# Patient Record
Sex: Female | Born: 1935
Health system: Southern US, Community
[De-identification: ages and names within clinical notes are randomized; demographics above are authoritative.]

## PROBLEM LIST (undated history)

## (undated) DIAGNOSIS — H409 Unspecified glaucoma: Secondary | ICD-10-CM

## (undated) DIAGNOSIS — E7800 Pure hypercholesterolemia, unspecified: Secondary | ICD-10-CM

## (undated) DIAGNOSIS — R413 Other amnesia: Secondary | ICD-10-CM

## (undated) DIAGNOSIS — M810 Age-related osteoporosis without current pathological fracture: Secondary | ICD-10-CM

## (undated) DIAGNOSIS — T4145XA Adverse effect of unspecified anesthetic, initial encounter: Secondary | ICD-10-CM

## (undated) DIAGNOSIS — I10 Essential (primary) hypertension: Secondary | ICD-10-CM

## (undated) DIAGNOSIS — G8929 Other chronic pain: Secondary | ICD-10-CM

## (undated) DIAGNOSIS — IMO0001 Reserved for inherently not codable concepts without codable children: Secondary | ICD-10-CM

## (undated) DIAGNOSIS — T8859XA Other complications of anesthesia, initial encounter: Secondary | ICD-10-CM

## (undated) DIAGNOSIS — M549 Dorsalgia, unspecified: Secondary | ICD-10-CM

## (undated) DIAGNOSIS — H9190 Unspecified hearing loss, unspecified ear: Secondary | ICD-10-CM

## (undated) DIAGNOSIS — F32A Depression, unspecified: Secondary | ICD-10-CM

## (undated) DIAGNOSIS — F329 Major depressive disorder, single episode, unspecified: Secondary | ICD-10-CM

## (undated) HISTORY — PX: EYE SURGERY: SHX253

## (undated) HISTORY — PX: DILATION AND CURETTAGE OF UTERUS: SHX78

## (undated) HISTORY — PX: BACK SURGERY: SHX140

## (undated) HISTORY — PX: HAND SURGERY: SHX662

## (undated) HISTORY — PX: ABDOMINAL HYSTERECTOMY: SHX81

---

## 1998-05-10 ENCOUNTER — Ambulatory Visit (HOSPITAL_COMMUNITY): Admission: RE | Admit: 1998-05-10 | Discharge: 1998-05-10 | Payer: Self-pay | Admitting: Orthopaedic Surgery

## 1998-06-24 ENCOUNTER — Inpatient Hospital Stay (HOSPITAL_COMMUNITY): Admission: AD | Admit: 1998-06-24 | Discharge: 1998-06-25 | Payer: Self-pay | Admitting: Orthopaedic Surgery

## 1999-07-02 ENCOUNTER — Emergency Department (HOSPITAL_COMMUNITY): Admission: EM | Admit: 1999-07-02 | Discharge: 1999-07-02 | Payer: Self-pay | Admitting: Emergency Medicine

## 2001-09-18 ENCOUNTER — Ambulatory Visit (HOSPITAL_COMMUNITY): Admission: RE | Admit: 2001-09-18 | Discharge: 2001-09-18 | Payer: Self-pay | Admitting: Family Medicine

## 2001-09-18 ENCOUNTER — Encounter: Payer: Self-pay | Admitting: Family Medicine

## 2003-08-12 ENCOUNTER — Encounter: Payer: Self-pay | Admitting: Family Medicine

## 2003-08-12 ENCOUNTER — Encounter: Admission: RE | Admit: 2003-08-12 | Discharge: 2003-08-12 | Payer: Self-pay | Admitting: Family Medicine

## 2003-09-24 ENCOUNTER — Ambulatory Visit (HOSPITAL_COMMUNITY): Admission: RE | Admit: 2003-09-24 | Discharge: 2003-09-24 | Payer: Self-pay | Admitting: Family Medicine

## 2004-01-06 ENCOUNTER — Encounter: Admission: RE | Admit: 2004-01-06 | Discharge: 2004-01-06 | Payer: Self-pay | Admitting: Neurology

## 2004-05-11 ENCOUNTER — Encounter: Admission: RE | Admit: 2004-05-11 | Discharge: 2004-05-11 | Payer: Self-pay | Admitting: Family Medicine

## 2004-06-08 ENCOUNTER — Encounter: Admission: RE | Admit: 2004-06-08 | Discharge: 2004-06-08 | Payer: Self-pay | Admitting: Unknown Physician Specialty

## 2004-06-24 ENCOUNTER — Ambulatory Visit (HOSPITAL_COMMUNITY): Admission: RE | Admit: 2004-06-24 | Discharge: 2004-06-24 | Payer: Self-pay | Admitting: Gastroenterology

## 2004-06-24 ENCOUNTER — Encounter (INDEPENDENT_AMBULATORY_CARE_PROVIDER_SITE_OTHER): Payer: Self-pay | Admitting: Specialist

## 2004-09-12 ENCOUNTER — Encounter: Admission: RE | Admit: 2004-09-12 | Discharge: 2004-09-12 | Payer: Self-pay | Admitting: Family Medicine

## 2005-12-06 ENCOUNTER — Encounter: Admission: RE | Admit: 2005-12-06 | Discharge: 2005-12-06 | Payer: Self-pay | Admitting: Family Medicine

## 2005-12-12 ENCOUNTER — Ambulatory Visit: Admission: RE | Admit: 2005-12-12 | Discharge: 2005-12-12 | Payer: Self-pay | Admitting: Family Medicine

## 2006-01-17 ENCOUNTER — Encounter: Admission: RE | Admit: 2006-01-17 | Discharge: 2006-01-17 | Payer: Self-pay | Admitting: Neurosurgery

## 2006-04-18 ENCOUNTER — Inpatient Hospital Stay (HOSPITAL_COMMUNITY): Admission: RE | Admit: 2006-04-18 | Discharge: 2006-04-24 | Payer: Self-pay | Admitting: Neurosurgery

## 2006-04-23 ENCOUNTER — Ambulatory Visit: Payer: Self-pay | Admitting: Physical Medicine & Rehabilitation

## 2007-06-24 ENCOUNTER — Ambulatory Visit (HOSPITAL_COMMUNITY): Admission: RE | Admit: 2007-06-24 | Discharge: 2007-06-24 | Payer: Self-pay | Admitting: Neurosurgery

## 2007-09-16 ENCOUNTER — Ambulatory Visit (HOSPITAL_COMMUNITY): Admission: RE | Admit: 2007-09-16 | Discharge: 2007-09-16 | Payer: Self-pay | Admitting: Internal Medicine

## 2008-02-16 ENCOUNTER — Encounter: Admission: RE | Admit: 2008-02-16 | Discharge: 2008-02-16 | Payer: Self-pay | Admitting: Neurosurgery

## 2008-03-09 ENCOUNTER — Encounter: Admission: RE | Admit: 2008-03-09 | Discharge: 2008-03-09 | Payer: Self-pay | Admitting: Neurology

## 2008-09-21 ENCOUNTER — Encounter (HOSPITAL_COMMUNITY): Admission: RE | Admit: 2008-09-21 | Discharge: 2008-12-02 | Payer: Self-pay | Admitting: Internal Medicine

## 2009-02-13 ENCOUNTER — Encounter: Admission: RE | Admit: 2009-02-13 | Discharge: 2009-02-13 | Payer: Self-pay | Admitting: Neurosurgery

## 2009-08-13 ENCOUNTER — Ambulatory Visit (HOSPITAL_COMMUNITY): Admission: RE | Admit: 2009-08-13 | Discharge: 2009-08-13 | Payer: Self-pay | Admitting: Neurosurgery

## 2009-08-17 ENCOUNTER — Encounter: Admission: RE | Admit: 2009-08-17 | Discharge: 2009-08-17 | Payer: Self-pay | Admitting: Neurosurgery

## 2009-12-21 ENCOUNTER — Ambulatory Visit: Payer: Self-pay | Admitting: Diagnostic Radiology

## 2009-12-21 ENCOUNTER — Emergency Department (HOSPITAL_BASED_OUTPATIENT_CLINIC_OR_DEPARTMENT_OTHER): Admission: EM | Admit: 2009-12-21 | Discharge: 2009-12-22 | Payer: Self-pay | Admitting: Emergency Medicine

## 2009-12-22 ENCOUNTER — Ambulatory Visit: Payer: Self-pay | Admitting: Diagnostic Radiology

## 2010-04-20 ENCOUNTER — Encounter: Admission: RE | Admit: 2010-04-20 | Discharge: 2010-04-20 | Payer: Self-pay | Admitting: Internal Medicine

## 2010-05-06 ENCOUNTER — Encounter (HOSPITAL_COMMUNITY): Admission: RE | Admit: 2010-05-06 | Discharge: 2010-05-06 | Payer: Self-pay | Admitting: Internal Medicine

## 2011-04-01 LAB — URINALYSIS, ROUTINE W REFLEX MICROSCOPIC
Bilirubin Urine: NEGATIVE
Glucose, UA: NEGATIVE mg/dL
Hgb urine dipstick: NEGATIVE
Ketones, ur: NEGATIVE mg/dL
Nitrite: NEGATIVE
Protein, ur: NEGATIVE mg/dL
Specific Gravity, Urine: 1.016 (ref 1.005–1.030)
Urobilinogen, UA: 0.2 mg/dL (ref 0.0–1.0)
pH: 6 (ref 5.0–8.0)

## 2011-04-01 LAB — COMPREHENSIVE METABOLIC PANEL
ALT: 11 U/L (ref 0–35)
AST: 24 U/L (ref 0–37)
Albumin: 3.8 g/dL (ref 3.5–5.2)
Alkaline Phosphatase: 79 U/L (ref 39–117)
BUN: 8 mg/dL (ref 6–23)
CO2: 28 mEq/L (ref 19–32)
Calcium: 9.3 mg/dL (ref 8.4–10.5)
Chloride: 100 mEq/L (ref 96–112)
Creatinine, Ser: 0.76 mg/dL (ref 0.4–1.2)
GFR calc Af Amer: >60 mL/min (ref >60)
GFR calc non Af Amer: >60 mL/min (ref >60)
Glucose, Bld: 88 mg/dL (ref 70–99)
Potassium: 4 mEq/L (ref 3.5–5.1)
Sodium: 137 mEq/L (ref 135–145)
Total Bilirubin: 0.5 mg/dL (ref 0.3–1.2)
Total Protein: 6.7 g/dL (ref 6.0–8.3)

## 2011-04-01 LAB — CBC
HCT: 38.9 % (ref 36.0–46.0)
Hemoglobin: 13.2 g/dL (ref 12.0–15.0)
MCHC: 33.8 g/dL (ref 30.0–36.0)
MCV: 88.3 fL (ref 78.0–100.0)
Platelets: 241 10*3/uL (ref 150–400)
RBC: 4.41 MIL/uL (ref 3.87–5.11)
RDW: 14 % (ref 11.5–15.5)
WBC: 10.8 10*3/uL — ABNORMAL HIGH (ref 4.0–10.5)

## 2011-04-01 LAB — URINE MICROSCOPIC-ADD ON

## 2011-04-01 LAB — DIFFERENTIAL
Basophils Absolute: 0 10*3/uL (ref 0.0–0.1)
Basophils Relative: 0 % (ref 0–1)
Eosinophils Absolute: 0.4 10*3/uL (ref 0.0–0.7)
Eosinophils Relative: 3 % (ref 0–5)
Lymphocytes Relative: 26 % (ref 12–46)
Lymphs Abs: 2.9 10*3/uL (ref 0.7–4.0)
Monocytes Absolute: 1 10*3/uL (ref 0.1–1.0)
Monocytes Relative: 9 % (ref 3–12)
Neutro Abs: 6.9 10*3/uL (ref 1.7–7.7)
Neutrophils Relative %: 61 % (ref 43–77)

## 2011-04-01 LAB — APTT: aPTT: 32 seconds (ref 24–37)

## 2011-04-01 LAB — PROTIME-INR
INR: 1 (ref 0.00–1.49)
Prothrombin Time: 13.3 seconds (ref 11.6–15.2)

## 2011-04-14 ENCOUNTER — Other Ambulatory Visit: Payer: Self-pay | Admitting: Internal Medicine

## 2011-04-14 DIAGNOSIS — Z1231 Encounter for screening mammogram for malignant neoplasm of breast: Secondary | ICD-10-CM

## 2011-04-25 ENCOUNTER — Ambulatory Visit
Admission: RE | Admit: 2011-04-25 | Discharge: 2011-04-25 | Disposition: A | Discharge status: Home / Self Care | Payer: Medicare Other | Source: Ambulatory Visit | Attending: Internal Medicine | Admitting: Internal Medicine

## 2011-04-25 DIAGNOSIS — Z1231 Encounter for screening mammogram for malignant neoplasm of breast: Secondary | ICD-10-CM

## 2011-05-09 NOTE — H&P (Signed)
Cynthia Guzman, JANUARY NO.:  [id number]   MEDICAL RECORD NO.:  [id number]          PATIENT TYPE:  INP   LOCATION:  2899                         FACILITY:  [facility]   PHYSICIAN:  Payton Doughty, M.D.      DATE OF BIRTH:  1932/04/14   DATE OF ADMISSION:  08/13/2009  DATE OF DISCHARGE:                              HISTORY & PHYSICAL   ADDENDUM:  Ms. Altemose complained of pain in the mid lower back and  palpation of the spine revealed exquisite point tenderness over about  T12 area.  Plain films were obtained that show a beginning compression  fracture at that level.  With this information, it was deemed unwise to  proceed with a decompression.  She is going to undergo an MRI and  vertebroplasty to be arranged over the next week for so.           ______________________________  Payton Doughty, M.D.     MWR/MEDQ  D:  08/13/2009  T:  08/13/2009  Job:  [id number]

## 2011-05-09 NOTE — H&P (Signed)
NAMECALIN, ELLERY NO.:  [id number]   MEDICAL RECORD NO.:  [id number]           PATIENT TYPE:  INP   LOCATION:                               FACILITY:  [facility]   PHYSICIAN:  Payton Doughty, M.D.      DATE OF BIRTH:  02/09/1932   DATE OF ADMISSION:  08/13/2009  DATE OF DISCHARGE:  08/13/2009                              HISTORY & PHYSICAL   ADMISSION DIAGNOSIS:  Spondylosis at L1-2, L2-3, and L3-4.   BODY OF TEXT:  This is a very nice 77 year old right-handed white lady  who has had a fusion at L4-5 following 2 decompressions by Dr. Ollen Bowl  and Dr. Ophelia Charter respectively.  She has had increasing claudicatory  complaints in her lower extremities after having done well for about a  year and half after fusion.  MR shows stenosis at L1-2, L2-3, and L3-4  and she is now admitted for decompression at those levels.   MEDICAL HISTORY:  Remarkable for hypertension.   She is on Norvasc, Zoloft, Lopressor, Darvocet, Aricept, and Xalatan.   SURGICAL HISTORY:  Two lumbar decompressions, a hysterectomy,  hemorrhoids, and foot surgery.   She has no allergies.   SOCIAL HISTORY:  She does not smoke or drinks.  She is a retired Building control surveyor.   FAMILY HISTORY:  Mom died at 58 years of age.  She had arthritis and a  bad back.  Dad died at 66 of alcohol-related problems.   REVIEW OF SYSTEMS:  Remarkable for glasses, glaucoma, hearing loss,  tinnitus, hypertension, heart murmur, hypercholesterolemia, leg pain,  leg weakness, back pain, and neck pain.   PHYSICAL EXAMINATION:  HEENT:  Within normal limits.  NECK:  She has good range of motion of her neck.  CHEST:  Clear.  CARDIAC:  Regular rate and rhythm with a 1/6 systolic murmur.  ABDOMEN:  Nontender.  No hepatosplenomegaly.  EXTREMITIES:  No clubbing or cyanosis.  GU:  Deferred.  Peripheral pulses are good.  NEUROLOGIC:  She is awake, alert, and oriented.  Cranial nerves are  intact.  Motor exam shows good strength when  seated.  When she is up and  about, subjectively her legs get weaker and she has difficulty getting  up and down steps.   MR shows stenosis at L1-2, L2-3, and L3-4.  She has tried epidural  steroids which did not seem to help her much.   CLINICAL IMPRESSION:  Neurogenic claudication secondary to lumbar  spondylosis.   Plan is for decompressive laminectomy at L1-2, L2-3, and L3-4.  The  risks and benefits have been discussed with her and she wished to  proceed.           ______________________________  Payton Doughty, M.D.     MWR/MEDQ  D:  08/13/2009  T:  08/14/2009  Job:  [id number]

## 2011-05-12 NOTE — Op Note (Signed)
Cynthia Guzman, ROTTER NO.:  [id number]   MEDICAL RECORD NO.:  [id number]          PATIENT TYPE:  INP   LOCATION:  3105                         FACILITY:  [facility]   PHYSICIAN:  Payton Doughty, M.D.      DATE OF BIRTH:  1932/03/26   DATE OF PROCEDURE:  04/18/2006  DATE OF DISCHARGE:                                 OPERATIVE REPORT   PREOPERATIVE DIAGNOSIS:  Spondylosis with spondylolisthesis at L4-5.   POSTOPERATIVE DIAGNOSIS:  Bilateral pars fractures of L4 with  spondylolisthesis of L4 and L5.   PROCEDURE:  L4-5 pedicle screws and posterolateral arthrodesis.   SURGEON:  Payton Doughty, M.D.   SERVICE:  Neurosurgery.   COMPLICATIONS:  None.   BODY OF TEXT:  [age]-year-old right-handed white girl with back pain,  leg pain and spondylolisthesis of L4 on 5.  She has had 2 prior  decompressions by other doctors.   DESCRIPTION OF PROCEDURE:  She was taken to the operating room and smoothly  anesthetized and intubated and placed prone on the operating table.  Following shaving, prepped and draped in the usual sterile fashion.  Skin  was infiltrated with 1% lidocaine and 1:400,000 epinephrine.  Old skin  incision was reopened.  The transverse process of L4 and L5 were identified  and then the remaining lamina and facet joints of 3-4 and 4-5 were  uncovered.  There was a very large pars fracture on the left side and a  smaller fracture on the right of the pars interarticularis, rendering the  level unstable.  Because of this obvious pathology, it was decided to simply  place pedicle screws at 4 and 5 and perform a fusion.  Pedicle screws were  placed at L4 and L5 using the standard landmarks and intraoperative x-ray  confirmed good placement of the screws.  There were attached to the rods and  capped.  The transverse processes and remaining facet joints were  decorticated and packed with BMP on the extender matrix.  Intraoperative x-  rays showed good placement of  screws and rods.  An 0 Vicryl was used to  close the fascia and subcutaneous tissue.  Subcuticular tissues were  reapproximated with 2-0 Vicryl and skin was closed with 3-0 nylon in running-  locked fashion.  Betadine and Telfa dressing were applied and the patient  returned to the recovery room in good condition.           ______________________________  Payton Doughty, M.D.     MWR/MEDQ  D:  04/18/2006  T:  04/19/2006  Job:  [id number]

## 2011-05-12 NOTE — Discharge Summary (Signed)
Cynthia Guzman, MARTINEK NO.:  [id number]   MEDICAL RECORD NO.:  [id number]          PATIENT TYPE:  INP   LOCATION:  3021                         FACILITY:  [facility]   PHYSICIAN:  Payton Doughty, M.D.      DATE OF BIRTH:  12/21/1932   DATE OF ADMISSION:  04/18/2006  DATE OF DISCHARGE:  04/24/2006                                 DISCHARGE SUMMARY   ADMISSION DIAGNOSIS:  Spondylosis L4-5.   DISCHARGE DIAGNOSIS:  Partial fracture L4-5.   COMPLICATIONS:  None.   DISCHARGE STATUS:  Lying well.   PROCEDURES:  1.  L4-5 pedicle screw fixation, posterolateral arthrodesis.   HISTORY OF PRESENT ILLNESS:  This 77 year old right handed female with  History and Physical as on the chart, has had two prior lumbar spine  operations.  Had increasing pain in her back and lower extremities.   PHYSICAL EXAMINATION:  GENERAL APPEARANCE:  Intact.  NEUROLOGICAL:  Intact with a grade 2 slip of L4-5.   HOSPITAL COURSE:  She was admitted after I obtained normal laboratory values  and underwent fusion and exploration and was found to have a pars fracture  bilateral of L4.  She had pedicle screws placed at 4 and 5 and  posterolateral arthrodesis.  Postoperatively, she has done extremely well.  Her leg and back pain have completely gone.  She took a little bit of time  to mobilize because of her age, but currently she is participating in  physical therapy, is up eating and voiding normally.  She had one bout of  nausea and vomiting that kept her in the hospital today, but today, she is  eating well.  She is being discharged home in the care of her family with  Percocet for pain, Cipro prophylaxis.  Her follow up will be in the [facility] office in a week for sutures.           ______________________________  Payton Doughty, M.D.     MWR/MEDQ  D:  04/24/2006  T:  04/25/2006  Job:  [id number] [id number]

## 2011-05-12 NOTE — H&P (Signed)
Cynthia Guzman, Cynthia Guzman NO.:  [id number]   MEDICAL RECORD NO.:  [id number]          PATIENT TYPE:  INP   LOCATION:  2899                         FACILITY:  [facility]   PHYSICIAN:  Payton Doughty, M.D.      DATE OF BIRTH:  1932-01-31   DATE OF ADMISSION:  04/18/2006  DATE OF DISCHARGE:                                HISTORY & PHYSICAL   ADMITTING DIAGNOSES:  Spondylosis L4-5.   Very nice 77 year old right-handed white female who in 1988 had a  decompression by Dr. Ollen Bowl, in 1998 had a second one by Dr. Ophelia Charter.  She  had not gotten much better since the second one.  There is pain in her back  that is worsening.  She ___________ down both lower extremities, worse on  the left than on the right after she has been up a bit.  She walks somewhat  bent forward, though she says leaning forward does not help her.  She does  get better when she sits.   MEDICAL HISTORY:  Hypertension.  She is on Norvasc 5 mg a day, Zoloft 100 mg  a day, Lopressor 5 mg a day, Darvocet on a p.r.n. basis, Aricept 10 mg a  day, Xalatan eye drops.   SURGICAL HISTORY:  1.  Two lumbar decompressions.  2.  Hysterectomy.  3.  Hemorrhoids.  4.  Foot surgery.   She has no allergies.   SOCIAL HISTORY:  She does not smoke or drink.  She is a retired Clinical cytogeneticist.   FAMILY HISTORY:  Mom had passed away at 77 years of age.  She had a history  of arthritis in the neck.  Her daddy died at 94 of alcohol-related problems.   REVIEW OF SYSTEMS:  Remarkable for glasses, glaucoma, hearing loss,  tinnitus, hypertension, heart murmur, hypercholesterolemia, leg pain, leg  weakness, back pain, neck pain.   PHYSICAL EXAMINATION:  HEENT:  Normal limits.  She has reasonable range of  motion in her neck.  CHEST:  Clear.  CARDIAC:  Regular rate and rhythm with a 1/6 systolic murmur.  ABDOMEN:  Nontender.  No hepatosplenomegaly.  EXTREMITIES:  Without clubbing or cyanosis.  GENITOURINARY:  Deferred.  Peripheral  pulses are good.  NEUROLOGIC:  She is awake, alert, and oriented.  Cranial nerves appear to be  intact save for bilateral hearing loss.  Motor examination showed 5/5  strength throughout the upper and lower extremities to confrontational  testing with the patient seated.  Reflexes are absent at the knees, absent  at the ankles.  Straight leg raise is negative.  Sensory:  Dysesthesias are  described bilaterally in an L5 distribution.   MR shows grade 1 verging on grade 2 slip at L4-5 with significant stenosis  and bilateral foraminal narrowing.   CLINICAL IMPRESSION:  Lumbar spondylosis with neurogenic claudication.   PLAN:  Lumbar laminectomy, diskectomy, posterior lumbar interbody fusion  with Ray threaded fusion cages, pedicle screws, and posterolateral  arthrodesis.  This assumes that the interbody space is approachable.  Other  than that it will just be pedicle screws and posterolateral arthrodesis.  The risks and benefits of this approach have been discussed with her and she  wished to proceed.           ______________________________  Payton Doughty, M.D.     MWR/MEDQ  D:  04/18/2006  T:  04/18/2006  Job:  [id number]

## 2011-05-12 NOTE — Op Note (Signed)
NAME:  Cynthia Guzman, BURROUGH                       ACCOUNT NO.:  [id number]   MEDICAL RECORD NO.:  [id number]                   PATIENT TYPE:  AMB   LOCATION:  ENDO                                 FACILITY:  [facility]   PHYSICIAN:  James L. Malon Kindle., M.D.          DATE OF BIRTH:  09/08/1932   DATE OF PROCEDURE:  06/24/2004  DATE OF DISCHARGE:                                 OPERATIVE REPORT   PROCEDURE:  Colonoscopy and biopsy.   MEDICATIONS:  Fentanyl 100 mcg, Versed 10 mg IV.   SCOPE:  Olympus pediatric adjustable colonoscope.   INDICATIONS:  Recent onset of diarrhea and weight loss.  Patient has lost  approximately 20 pounds, having multiple bowel movements daily.  Has a  history of mild diverticular disease.   DESCRIPTION OF PROCEDURE:  The procedure had been explained to the patient,  and consent was obtained.  In the left lateral decubitus position, the  Olympus pediatric adjustable scope was inserted in advanced.  We were able  to advance easily to the cecum.  The ileocecal valve was seen.  The terminal  ileum was entered for approximately 10 cm and was normal.  The scope was  withdrawn.  The colon carefully examined upon withdrawal.  The mucosa  throughout the entire colon was normal.  No evidence of gross colitis.  The  cecum, ascending colon, transverse, descending, and sigmoid colon were seen  well.  Scattered diverticula in the sigmoid colon.  No polyps or other  lesions seen throughout.  The mucosa was endoscopically normal with a good  vascular pattern.  There is no edema seen throughout.  Multiple random  biopsies were obtained to look for evidence of microscopic colitis.  The  rectum was free of polyps or other lesions.  The scope was withdrawn.  The  patient tolerated the procedure well.  Was maintained on low-flow oxygen and  monitored throughout.   ASSESSMENT:  Diarrhea, probably functional, 564.5, but need to rule out  microscopic colitis.   PLAN:  Will check  path.  Will see back in the office in two weeks.  In the  interim, we will go ahead and give her a prescription for Bentyl 10 mg  t.i.d.                                               Fayrene Fearing L. Malon Kindle., M.D.    Waldron Session  D:  06/24/2004  T:  06/25/2004  Job:  [id number]   cc:   Dario Guardian, M.D.  [address]  [place]  [place] [address]  Fax: [phone]

## 2012-01-25 ENCOUNTER — Other Ambulatory Visit: Payer: Self-pay | Admitting: Neurosurgery

## 2012-01-25 DIAGNOSIS — M47816 Spondylosis without myelopathy or radiculopathy, lumbar region: Secondary | ICD-10-CM

## 2012-02-06 ENCOUNTER — Inpatient Hospital Stay: Admission: RE | Admit: 2012-02-06 | Payer: Medicare Other | Source: Ambulatory Visit

## 2012-04-02 ENCOUNTER — Other Ambulatory Visit: Payer: Self-pay | Admitting: Internal Medicine

## 2012-04-02 DIAGNOSIS — Z1231 Encounter for screening mammogram for malignant neoplasm of breast: Secondary | ICD-10-CM

## 2012-04-25 ENCOUNTER — Ambulatory Visit
Admission: RE | Admit: 2012-04-25 | Discharge: 2012-04-25 | Disposition: A | Discharge status: Home / Self Care | Payer: Medicare Other | Source: Ambulatory Visit | Attending: Internal Medicine | Admitting: Internal Medicine

## 2012-04-25 DIAGNOSIS — Z1231 Encounter for screening mammogram for malignant neoplasm of breast: Secondary | ICD-10-CM

## 2012-06-17 ENCOUNTER — Other Ambulatory Visit (HOSPITAL_COMMUNITY): Payer: Self-pay | Admitting: *Deleted

## 2012-06-19 ENCOUNTER — Encounter (HOSPITAL_COMMUNITY): Payer: Self-pay

## 2012-06-19 ENCOUNTER — Encounter (HOSPITAL_COMMUNITY)
Admission: RE | Admit: 2012-06-19 | Discharge: 2012-06-19 | Disposition: A | Discharge status: Home / Self Care | Payer: Medicare Other | Source: Ambulatory Visit | Attending: Internal Medicine | Admitting: Internal Medicine

## 2012-06-19 DIAGNOSIS — M810 Age-related osteoporosis without current pathological fracture: Secondary | ICD-10-CM | POA: Insufficient documentation

## 2012-06-19 HISTORY — DX: Essential (primary) hypertension: I10

## 2012-06-19 HISTORY — DX: Dorsalgia, unspecified: M54.9

## 2012-06-19 HISTORY — DX: Major depressive disorder, single episode, unspecified: F32.9

## 2012-06-19 HISTORY — DX: Unspecified hearing loss, unspecified ear: H91.90

## 2012-06-19 HISTORY — DX: Other amnesia: R41.3

## 2012-06-19 HISTORY — DX: Other chronic pain: G89.29

## 2012-06-19 HISTORY — DX: Depression, unspecified: F32.A

## 2012-06-19 HISTORY — DX: Age-related osteoporosis without current pathological fracture: M81.0

## 2012-06-19 HISTORY — DX: Reserved for inherently not codable concepts without codable children: IMO0001

## 2012-06-19 HISTORY — DX: Pure hypercholesterolemia, unspecified: E78.00

## 2012-06-19 MED ORDER — ZOLEDRONIC ACID 5 MG/100ML IV SOLN
5.0000 mg | Freq: Once | INTRAVENOUS | Status: AC
  Administered 2012-06-19: 5 mg via INTRAVENOUS
  Filled 2012-06-19: qty 100

## 2012-06-19 MED ORDER — SODIUM CHLORIDE 0.9 % IV SOLN
Freq: Once | INTRAVENOUS | Status: AC
  Administered 2012-06-19: 250 mL via INTRAVENOUS

## 2012-06-19 NOTE — Discharge Instructions (Signed)
Drink fluids/water as tolerated over next 72hrs Tylenol or Ibuprofen OTC as directed Continue calcium and Vit D as directed by your MD 

## 2012-08-27 ENCOUNTER — Other Ambulatory Visit: Payer: Self-pay | Admitting: Orthopedic Surgery

## 2012-08-27 MED ORDER — DEXAMETHASONE SODIUM PHOSPHATE 10 MG/ML IJ SOLN: 10.0000 mg | Freq: Once | INTRAMUSCULAR | Status: DC

## 2012-08-27 MED ORDER — BUPIVACAINE 0.25 % ON-Q PUMP SINGLE CATH 300ML: 300.0000 mL | INJECTION | Status: DC

## 2012-08-27 NOTE — Progress Notes (Signed)
Preoperative surgical orders have been place into the [facility] hospital system for Cynthia Guzman on 08/27/2012, 8:04 AM  by Patrica Duel for surgery on 11/18/12.  Preop Total Knee orders including Bupivacaine On-Q pump, IV Tylenol, and IV Decadron as long as there are no contraindications to the above medications. Avel Peace, PA-C

## 2012-09-06 ENCOUNTER — Ambulatory Visit
Admission: RE | Admit: 2012-09-06 | Discharge: 2012-09-06 | Disposition: A | Discharge status: Home / Self Care | Payer: Medicare Other | Source: Ambulatory Visit | Attending: Neurosurgery | Admitting: Neurosurgery

## 2012-09-06 DIAGNOSIS — M47816 Spondylosis without myelopathy or radiculopathy, lumbar region: Secondary | ICD-10-CM

## 2012-11-11 ENCOUNTER — Encounter (HOSPITAL_COMMUNITY): Payer: Self-pay | Admitting: Pharmacist

## 2012-11-12 ENCOUNTER — Other Ambulatory Visit: Payer: Self-pay

## 2012-11-12 ENCOUNTER — Encounter (HOSPITAL_COMMUNITY)
Admission: RE | Admit: 2012-11-12 | Discharge: 2012-11-12 | Disposition: A | Discharge status: Home / Self Care | Payer: Medicare Other | Source: Ambulatory Visit | Attending: Orthopedic Surgery | Admitting: Orthopedic Surgery

## 2012-11-12 ENCOUNTER — Encounter (HOSPITAL_COMMUNITY): Payer: Self-pay

## 2012-11-12 ENCOUNTER — Ambulatory Visit (HOSPITAL_COMMUNITY)
Admission: RE | Admit: 2012-11-12 | Discharge: 2012-11-12 | Disposition: A | Discharge status: Home / Self Care | Payer: Medicare Other | Source: Ambulatory Visit | Attending: Orthopedic Surgery | Admitting: Orthopedic Surgery

## 2012-11-12 DIAGNOSIS — Z01812 Encounter for preprocedural laboratory examination: Secondary | ICD-10-CM | POA: Insufficient documentation

## 2012-11-12 DIAGNOSIS — M1710 Unilateral primary osteoarthritis, unspecified knee: Secondary | ICD-10-CM | POA: Insufficient documentation

## 2012-11-12 DIAGNOSIS — I10 Essential (primary) hypertension: Secondary | ICD-10-CM | POA: Insufficient documentation

## 2012-11-12 HISTORY — DX: Adverse effect of unspecified anesthetic, initial encounter: T41.45XA

## 2012-11-12 HISTORY — DX: Unspecified glaucoma: H40.9

## 2012-11-12 HISTORY — DX: Other complications of anesthesia, initial encounter: T88.59XA

## 2012-11-12 LAB — URINALYSIS, ROUTINE W REFLEX MICROSCOPIC
Bilirubin Urine: NEGATIVE
Glucose, UA: NEGATIVE mg/dL
Hgb urine dipstick: NEGATIVE
Ketones, ur: NEGATIVE mg/dL
Nitrite: NEGATIVE
Protein, ur: NEGATIVE mg/dL
Specific Gravity, Urine: 1.017 (ref 1.005–1.030)
Urobilinogen, UA: 1 mg/dL (ref 0.0–1.0)
pH: 6.5 (ref 5.0–8.0)

## 2012-11-12 LAB — COMPREHENSIVE METABOLIC PANEL
ALT: 11 U/L (ref 0–35)
AST: 17 U/L (ref 0–37)
Albumin: 3.6 g/dL (ref 3.5–5.2)
Alkaline Phosphatase: 73 U/L (ref 39–117)
BUN: 17 mg/dL (ref 6–23)
CO2: 28 mEq/L (ref 19–32)
Calcium: 9 mg/dL (ref 8.4–10.5)
Chloride: 101 mEq/L (ref 96–112)
Creatinine, Ser: 1.14 mg/dL — ABNORMAL HIGH (ref 0.50–1.10)
GFR calc Af Amer: 52 mL/min — ABNORMAL LOW (ref >90)
GFR calc non Af Amer: 45 mL/min — ABNORMAL LOW (ref >90)
Glucose, Bld: 91 mg/dL (ref 70–99)
Potassium: 4.5 mEq/L (ref 3.5–5.1)
Sodium: 139 mEq/L (ref 135–145)
Total Bilirubin: 0.3 mg/dL (ref 0.3–1.2)
Total Protein: 7 g/dL (ref 6.0–8.3)

## 2012-11-12 LAB — SURGICAL PCR SCREEN
MRSA, PCR: NEGATIVE
Staphylococcus aureus: NEGATIVE

## 2012-11-12 LAB — CBC
HCT: 36.5 % (ref 36.0–46.0)
Hemoglobin: 12.2 g/dL (ref 12.0–15.0)
MCH: 27.5 pg (ref 26.0–34.0)
MCHC: 33.4 g/dL (ref 30.0–36.0)
MCV: 82.4 fL (ref 78.0–100.0)
Platelets: 218 10*3/uL (ref 150–400)
RBC: 4.43 MIL/uL (ref 3.87–5.11)
RDW: 12.9 % (ref 11.5–15.5)
WBC: 8.4 10*3/uL (ref 4.0–10.5)

## 2012-11-12 LAB — PROTIME-INR
INR: 0.96 (ref 0.00–1.49)
Prothrombin Time: 12.7 seconds (ref 11.6–15.2)

## 2012-11-12 LAB — APTT: aPTT: 35 seconds (ref 24–37)

## 2012-11-12 LAB — URINE MICROSCOPIC-ADD ON

## 2012-11-12 NOTE — Patient Instructions (Addendum)
20      Your procedure is scheduled on:  Monday 11/18/2012 at 0955 am  Report to [facility] at  0730  AM.  Call this number if you have problems the morning of surgery: [phone]   Remember:   Do not eat food or drink liquids after midnight!  Take these medicines the morning of surgery with A SIP OF WATER: none   Do not bring valuables to the hospital.  .  Leave suitcase in the car. After surgery it may be brought to your room.  For patients admitted to the hospital, checkout time is 11:00 AM the day of              Discharge.    Special Instructions: See [facility] Preparing  For Surgery Instruction Sheet. Do not wear jewelry, lotions powders, perfumes. Women do not shave legs or underarms for 12 hours before showers. Contacts, partial plates,              or dentures may not be worn into surgery.                          Patients discharged the day of surgery will not be allowed to drive home.If going home the same day of surgery, must have someone stay with you first 24 hrs.at home and arrange for someone to drive you home from the              Hospital. YOUR DRIVER IS: Ronald-spouse   Please read over the following fact sheets that you were given: MRSA INFORMATION,INCENTIVE SPIROMETRY SHEET, SLEEP APNEA SHEET, BLOOD TRANSFUSION SHEET                            Telford Nab.Nanney,RN,BSN     [phone]

## 2012-11-14 LAB — URINE CULTURE: Colony Count: 10000

## 2012-11-17 ENCOUNTER — Other Ambulatory Visit: Payer: Self-pay | Admitting: Orthopedic Surgery

## 2012-11-17 MED ORDER — CEFAZOLIN SODIUM-DEXTROSE 2-3 GM-% IV SOLR
2.0000 g | INTRAVENOUS | Status: AC
  Administered 2012-11-18: 2 g via INTRAVENOUS

## 2012-11-17 NOTE — H&P (Signed)
Cynthia Guzman  DOB: 10/30/1932 Married / Language: English / Race: White Female  Date of Admission:  11/18/12  Chief Complaint:  Right Knee Pain  History of Present Illness The patient is a 78 year old female who comes in for a preoperative History and Physical. The patient is scheduled for a right total knee arthroplasty to be performed by Dr. Frank V. Aluisio, MD at [facility] on 11/18/12. The patient is a 78 year old female who presents with knee complaints. The patient reports right knee symptoms including: pain (posterior), swelling, instability, catching (popping), giving way, weakness, stiffness, soreness and grinding which began year(s) ago without any known injury. Note for "Knee pain": She has gotten to the point now that she needed to be evaluated for the pain and instability. It has been several years since I saw Ms. Haughey. Her right knee has gotten progressively worse. She use to respond to injections but for the past few injections she did not get any benefit at all. She is at a stage now where the knee is hurting her all the time. She states it is limiting what she can and can not do. It hurts her at night. She would like to be more active but can not do so because of the knee. She has had multiple cortisone injections. She is ready to proceed with surgery. They have been treated conservatively in the past for the above stated problem and despite conservative measures, they continue to have progressive pain and severe functional limitations and dysfunction. They have failed non-operative management including home exercise, medications, and injections. It is felt that they would benefit from undergoing total joint replacement. Risks and benefits of the procedure have been discussed with the patient and they elect to proceed with surgery. There are no active contraindications to surgery such as ongoing infection or rapidly progressive neurological  disease.   Problem List/Past Medical Osteoarthritis, knee (715.96)   Allergies NSAIDs. Nausea.   Family History Cerebrovascular Accident. grandmother mothers side Drug / Alcohol Addiction. father   Social History Exercise. Exercises never Illicit drug use. no Living situation. live with spouse Children. 5 or more Current work status. retired Drug/Alcohol Rehab (Previously). no Marital status. married Number of flights of stairs before winded. 1 Tobacco / smoke exposure. no Tobacco use. former smoker; smoke(d) 1 pack(s) per day Alcohol use. never consumed alcohol Post-Surgical Plans. Plan is to go home with husband.   Medication History AmLODIPine Besylate (5MG Tablet, Oral) Active. Sertraline HCl (100MG Tablet, Oral) Active. Simvastatin (20MG Tablet, Oral) Active. Metoprolol Tartrate (25MG Tablet, Oral) Active. Morphine Sulfate IR (15MG Tablet, Oral) Active. Aspirin Childrens (81MG Tablet Chewable, Oral) Active. Vitamin D3 (2000UNIT Capsule, Oral) Active. Latanoprost (0.005% Solution, Ophthalmic) Active.   Pregnancy / Birth History Pregnant. no   Past Surgical History Rotator Cuff Repair. bilateral Spinal Surgery. 3 times Cataract Surgery. bilateral Hemorrhoidectomy Hysterectomy. partial (non-cancerous)   Medical History High blood pressure Hypercholesterolemia Chronic Pain Fibromyalgia Impaired Hearing   Review of Systems General:Not Present- Chills, Fever, Night Sweats, Fatigue, Weight Gain, Weight Loss and Memory Loss. Skin:Not Present- Hives, Itching, Rash, Eczema and Lesions. HEENT:Not Present- Tinnitus, Headache, Double Vision, Visual Loss, Hearing Loss and Dentures. Respiratory:Not Present- Shortness of breath with exertion, Shortness of breath at rest, Allergies, Coughing up blood and Chronic Cough. Cardiovascular:Not Present- Chest Pain, Racing/skipping heartbeats, Difficulty Breathing Lying Down, Murmur,  Swelling and Palpitations. Gastrointestinal:Not Present- Bloody Stool, Heartburn, Abdominal Pain, Vomiting, Nausea, Constipation, Diarrhea, Difficulty Swallowing, Jaundice and Loss of   appetitie. Female Genitourinary:Not Present- Blood in Urine, Urinary frequency, Weak urinary stream, Discharge, Flank Pain, Incontinence, Painful Urination, Urgency, Urinary Retention and Urinating at Night. Musculoskeletal:Not Present- Muscle Weakness, Muscle Pain, Joint Swelling, Joint Pain, Back Pain, Morning Stiffness and Spasms. Neurological:Not Present- Tremor, Dizziness, Blackout spells, Paralysis, Difficulty with balance and Weakness. Psychiatric:Not Present- Insomnia.   Vitals Pulse: 60 (Regular) Resp.: 16 (Unlabored) BP: 118/58 (Sitting, Right Arm, Standard)    Physical Exam The physical exam findings are as follows:  Note: Patient is a 78 year old female with continued knee pain. Patient is accompanied today by her husband. Patient is hard of hearing.   General Mental Status - Alert, cooperative and good historian. General Appearance- pleasant. Not in acute distress. Orientation- Oriented X3. Build & Nutrition- Well nourished and Well developed.   Head and Neck Head- normocephalic, atraumatic . Neck Global Assessment- bruit auscultated on the right and supple. no bruit auscultated on the left. Carotid Arteries- Right- bruit.   Eye Pupil- Bilateral- Regular and Round. Note: wears glasses Motion- Bilateral- EOMI.   ENMT  upper and lower dentures  Chest and Lung Exam Auscultation: Breath sounds:- clear at anterior chest wall and - clear at posterior chest wall. Adventitious sounds:- No Adventitious sounds.   Cardiovascular Auscultation:Rhythm- Regular rate and rhythm. Heart Sounds- S1 WNL and S2 WNL. Murmurs & Other Heart Sounds:Auscultation of the heart reveals - No Murmurs.   Abdomen Palpation/Percussion:Tenderness- Abdomen is  non-tender to palpation. Rigidity (guarding)- Abdomen is soft. Auscultation:Auscultation of the abdomen reveals - Bowel sounds normal.   Female Genitourinary Not done, not pertinent to present illness  Musculoskeletal She is a well developed female. No distress. Both hips are with a normal range of motion with no discomfort. The right knee is with slight valgus. Range is 5-120. Moderate crepitus on range of motion. Tender medial and lateral with no instability noted. The left knee 0-125. Minimal crepitus on range of motion. No joint line tenderness or instability. Pulses, sensation and motor are intact in both lower extremities.  RADIOGRAPHS: AP of both knees and lateral show severe end stage arthritis of her right knee, tricompartmental changes worse laterally with a slight valgus. She has severe patellofemoral arthritis also. The left knee is unremarkable.  Assessment & Plan Osteoarthritis, knee (715.96) Impression: Right Knee  Note: Plan is for a right total knee replacement by Dr. Aluisio.  Plan is to go home.  PCP - Dr. Tisovec - Patient has been seen preoperatively and felt to be stable for surgery.  Please note, at the time of her H&P, the patient stated that she had a fall about three weeks ago at her home on the porch. She denies any syncopal or presyncopal episode associated with the fall. She states that she did hit her head and developed dizziness soon thereafter. She had the dizziness feeling for about a week before it resolved. She denied having any visual changes during that week, and also denied nausea with the episode. She brings this up at her visist. Due to having a head injury and dizziness lasting almost a week, it was recommended that she go to her medical physician for evaluation prior to undergoing anesthesia, i.e. possible spinal anesthesia for knee repalcement.  Signed electronically by DREW L PERKINS, PA-C  

## 2012-11-18 ENCOUNTER — Encounter (HOSPITAL_COMMUNITY)
Admission: RE | Disposition: A | Discharge status: Home Health | Payer: Self-pay | Source: Ambulatory Visit | Attending: Orthopedic Surgery

## 2012-11-18 ENCOUNTER — Inpatient Hospital Stay (HOSPITAL_COMMUNITY): Payer: Medicare Other | Admitting: Anesthesiology

## 2012-11-18 ENCOUNTER — Encounter (HOSPITAL_COMMUNITY): Payer: Self-pay | Admitting: *Deleted

## 2012-11-18 ENCOUNTER — Inpatient Hospital Stay (HOSPITAL_COMMUNITY)
Admission: RE | Admit: 2012-11-18 | Discharge: 2012-11-21 | DRG: 470 | Disposition: A | Discharge status: Home Health | Payer: Medicare Other | Source: Ambulatory Visit | Attending: Orthopedic Surgery | Admitting: Orthopedic Surgery

## 2012-11-18 ENCOUNTER — Encounter (HOSPITAL_COMMUNITY): Payer: Self-pay | Admitting: Anesthesiology

## 2012-11-18 DIAGNOSIS — I10 Essential (primary) hypertension: Secondary | ICD-10-CM | POA: Diagnosis present

## 2012-11-18 DIAGNOSIS — Z96659 Presence of unspecified artificial knee joint: Secondary | ICD-10-CM

## 2012-11-18 DIAGNOSIS — M1710 Unilateral primary osteoarthritis, unspecified knee: Principal | ICD-10-CM | POA: Diagnosis present

## 2012-11-18 DIAGNOSIS — M179 Osteoarthritis of knee, unspecified: Secondary | ICD-10-CM | POA: Diagnosis present

## 2012-11-18 DIAGNOSIS — D62 Acute posthemorrhagic anemia: Secondary | ICD-10-CM | POA: Diagnosis not present

## 2012-11-18 HISTORY — PX: TOTAL KNEE ARTHROPLASTY: SHX125

## 2012-11-18 LAB — TYPE AND SCREEN
ABO/RH(D): O POS
Antibody Screen: NEGATIVE

## 2012-11-18 LAB — ABO/RH: ABO/RH(D): O POS

## 2012-11-18 SURGERY — ARTHROPLASTY, KNEE, TOTAL
Anesthesia: Spinal | Site: Knee | Laterality: Right | Wound class: Clean

## 2012-11-18 MED ORDER — CHLORHEXIDINE GLUCONATE 4 % EX LIQD
60.0000 mL | Freq: Once | CUTANEOUS | Status: DC
  Filled 2012-11-18: qty 60

## 2012-11-18 MED ORDER — SERTRALINE HCL 100 MG PO TABS
100.0000 mg | ORAL_TABLET | Freq: Every day | ORAL | Status: DC
  Administered 2012-11-18 – 2012-11-20 (×3): 100 mg via ORAL
  Filled 2012-11-18 (×4): qty 1

## 2012-11-18 MED ORDER — MORPHINE SULFATE 2 MG/ML IJ SOLN
1.0000 mg | INTRAMUSCULAR | Status: DC | PRN
  Administered 2012-11-18 – 2012-11-19 (×10): 2 mg via INTRAVENOUS
  Filled 2012-11-18 (×10): qty 1

## 2012-11-18 MED ORDER — PHENOL 1.4 % MT LIQD: 1.0000 | OROMUCOSAL | Status: DC | PRN

## 2012-11-18 MED ORDER — MENTHOL 3 MG MT LOZG: 1.0000 | LOZENGE | OROMUCOSAL | Status: DC | PRN

## 2012-11-18 MED ORDER — METHOCARBAMOL 100 MG/ML IJ SOLN
500.0000 mg | Freq: Four times a day (QID) | INTRAVENOUS | Status: DC | PRN
  Administered 2012-11-18 (×2): 500 mg via INTRAVENOUS
  Filled 2012-11-18 (×2): qty 5

## 2012-11-18 MED ORDER — ONDANSETRON HCL 4 MG PO TABS: 4.0000 mg | ORAL_TABLET | Freq: Four times a day (QID) | ORAL | Status: DC | PRN

## 2012-11-18 MED ORDER — LACTATED RINGERS IV SOLN
INTRAVENOUS | Status: DC
  Administered 2012-11-18: 11:00:00 via INTRAVENOUS
  Administered 2012-11-18: 1000 mL via INTRAVENOUS

## 2012-11-18 MED ORDER — METOPROLOL TARTRATE 50 MG PO TABS
50.0000 mg | ORAL_TABLET | Freq: Every day | ORAL | Status: DC
  Administered 2012-11-18 – 2012-11-20 (×3): 50 mg via ORAL
  Filled 2012-11-18 (×4): qty 1

## 2012-11-18 MED ORDER — METOCLOPRAMIDE HCL 10 MG PO TABS: 5.0000 mg | ORAL_TABLET | Freq: Three times a day (TID) | ORAL | Status: DC | PRN

## 2012-11-18 MED ORDER — ONDANSETRON HCL 4 MG/2ML IJ SOLN: 4.0000 mg | Freq: Four times a day (QID) | INTRAMUSCULAR | Status: DC | PRN

## 2012-11-18 MED ORDER — ACETAMINOPHEN 650 MG RE SUPP: 650.0000 mg | Freq: Four times a day (QID) | RECTAL | Status: DC | PRN

## 2012-11-18 MED ORDER — SODIUM CHLORIDE 0.9 % IR SOLN
Status: DC | PRN
  Administered 2012-11-18: 1000 mL

## 2012-11-18 MED ORDER — POLYETHYLENE GLYCOL 3350 17 G PO PACK
17.0000 g | PACK | Freq: Every day | ORAL | Status: DC | PRN
  Administered 2012-11-18: 17 g via ORAL

## 2012-11-18 MED ORDER — 0.9 % SODIUM CHLORIDE (POUR BTL) OPTIME
TOPICAL | Status: DC | PRN
  Administered 2012-11-18: 1000 mL

## 2012-11-18 MED ORDER — RIVAROXABAN 10 MG PO TABS
10.0000 mg | ORAL_TABLET | Freq: Every day | ORAL | Status: DC
  Administered 2012-11-19 – 2012-11-21 (×3): 10 mg via ORAL
  Filled 2012-11-18 (×4): qty 1

## 2012-11-18 MED ORDER — ONDANSETRON HCL 4 MG/2ML IJ SOLN
INTRAMUSCULAR | Status: DC | PRN
  Administered 2012-11-18: 4 mg via INTRAVENOUS

## 2012-11-18 MED ORDER — BUPIVACAINE 0.25 % ON-Q PUMP SINGLE CATH 300ML
INJECTION | Status: DC | PRN
  Administered 2012-11-18: 300 mL

## 2012-11-18 MED ORDER — EPHEDRINE SULFATE 50 MG/ML IJ SOLN
INTRAMUSCULAR | Status: DC | PRN
  Administered 2012-11-18 (×2): 5 mg via INTRAVENOUS

## 2012-11-18 MED ORDER — BUPIVACAINE 0.25 % ON-Q PUMP SINGLE CATH 300ML
INJECTION | Status: AC
  Filled 2012-11-18: qty 300

## 2012-11-18 MED ORDER — DEXAMETHASONE 6 MG PO TABS
10.0000 mg | ORAL_TABLET | Freq: Once | ORAL | Status: AC
  Filled 2012-11-18: qty 1

## 2012-11-18 MED ORDER — METHOCARBAMOL 500 MG PO TABS
500.0000 mg | ORAL_TABLET | Freq: Four times a day (QID) | ORAL | Status: DC | PRN
  Administered 2012-11-19 – 2012-11-21 (×5): 500 mg via ORAL
  Filled 2012-11-18 (×5): qty 1

## 2012-11-18 MED ORDER — DIPHENHYDRAMINE HCL 12.5 MG/5ML PO ELIX
12.5000 mg | ORAL_SOLUTION | ORAL | Status: DC | PRN
  Administered 2012-11-18: 12.5 mg via ORAL
  Administered 2012-11-19: 25 mg via ORAL
  Filled 2012-11-18: qty 5
  Filled 2012-11-18: qty 10

## 2012-11-18 MED ORDER — PROPOFOL 10 MG/ML IV EMUL
INTRAVENOUS | Status: DC | PRN
  Administered 2012-11-18: 50 ug/kg/min via INTRAVENOUS

## 2012-11-18 MED ORDER — FLEET ENEMA 7-19 GM/118ML RE ENEM: 1.0000 | ENEMA | Freq: Once | RECTAL | Status: AC | PRN

## 2012-11-18 MED ORDER — MORPHINE SULFATE 15 MG PO TABS
15.0000 mg | ORAL_TABLET | ORAL | Status: DC | PRN
  Filled 2012-11-18: qty 1

## 2012-11-18 MED ORDER — DOCUSATE SODIUM 100 MG PO CAPS
100.0000 mg | ORAL_CAPSULE | Freq: Two times a day (BID) | ORAL | Status: DC
  Administered 2012-11-18 – 2012-11-21 (×6): 100 mg via ORAL

## 2012-11-18 MED ORDER — BISACODYL 10 MG RE SUPP: 10.0000 mg | Freq: Every day | RECTAL | Status: DC | PRN

## 2012-11-18 MED ORDER — OXYCODONE HCL 5 MG PO TABS
5.0000 mg | ORAL_TABLET | ORAL | Status: DC | PRN
  Administered 2012-11-18 – 2012-11-21 (×13): 10 mg via ORAL
  Filled 2012-11-18 (×13): qty 2

## 2012-11-18 MED ORDER — BUPIVACAINE ON-Q PAIN PUMP (FOR ORDER SET NO CHG)
INJECTION | Status: DC
  Filled 2012-11-18: qty 1

## 2012-11-18 MED ORDER — MIDAZOLAM HCL 5 MG/5ML IJ SOLN
INTRAMUSCULAR | Status: DC | PRN
  Administered 2012-11-18 (×2): 1 mg via INTRAVENOUS

## 2012-11-18 MED ORDER — TRAMADOL HCL 50 MG PO TABS: 50.0000 mg | ORAL_TABLET | Freq: Four times a day (QID) | ORAL | Status: DC | PRN

## 2012-11-18 MED ORDER — ACETAMINOPHEN 10 MG/ML IV SOLN
1000.0000 mg | Freq: Once | INTRAVENOUS | Status: AC
  Administered 2012-11-18: 1000 mg via INTRAVENOUS

## 2012-11-18 MED ORDER — MORPHINE SULFATE 15 MG PO TABS
15.0000 mg | ORAL_TABLET | ORAL | Status: DC | PRN
  Administered 2012-11-18 – 2012-11-19 (×3): 15 mg via ORAL
  Filled 2012-11-18 (×2): qty 1

## 2012-11-18 MED ORDER — ACETAMINOPHEN 325 MG PO TABS
650.0000 mg | ORAL_TABLET | Freq: Four times a day (QID) | ORAL | Status: DC | PRN
  Administered 2012-11-20 – 2012-11-21 (×2): 650 mg via ORAL
  Filled 2012-11-18 (×2): qty 2

## 2012-11-18 MED ORDER — LATANOPROST 0.005 % OP SOLN
1.0000 [drp] | Freq: Every day | OPHTHALMIC | Status: DC
  Administered 2012-11-18 – 2012-11-20 (×3): 1 [drp] via OPHTHALMIC
  Filled 2012-11-18: qty 2.5

## 2012-11-18 MED ORDER — AMLODIPINE BESYLATE 5 MG PO TABS
5.0000 mg | ORAL_TABLET | Freq: Every day | ORAL | Status: DC
  Administered 2012-11-18 – 2012-11-20 (×3): 5 mg via ORAL
  Filled 2012-11-18 (×4): qty 1

## 2012-11-18 MED ORDER — FENTANYL CITRATE 0.05 MG/ML IJ SOLN
INTRAMUSCULAR | Status: DC | PRN
  Administered 2012-11-18: 100 ug via INTRAVENOUS

## 2012-11-18 MED ORDER — SODIUM CHLORIDE 0.9 % IV SOLN: INTRAVENOUS | Status: DC

## 2012-11-18 MED ORDER — BUPIVACAINE IN DEXTROSE 0.75-8.25 % IT SOLN
INTRATHECAL | Status: DC | PRN
  Administered 2012-11-18: 1.6 mL via INTRATHECAL

## 2012-11-18 MED ORDER — ACETAMINOPHEN 10 MG/ML IV SOLN
1000.0000 mg | Freq: Four times a day (QID) | INTRAVENOUS | Status: AC
  Administered 2012-11-18 – 2012-11-19 (×4): 1000 mg via INTRAVENOUS
  Filled 2012-11-18 (×6): qty 100

## 2012-11-18 MED ORDER — METOCLOPRAMIDE HCL 5 MG/ML IJ SOLN: 5.0000 mg | Freq: Three times a day (TID) | INTRAMUSCULAR | Status: DC | PRN

## 2012-11-18 MED ORDER — PROMETHAZINE HCL 25 MG/ML IJ SOLN: 6.2500 mg | INTRAMUSCULAR | Status: DC | PRN

## 2012-11-18 MED ORDER — HYDROMORPHONE HCL PF 1 MG/ML IJ SOLN: 0.2500 mg | INTRAMUSCULAR | Status: DC | PRN

## 2012-11-18 MED ORDER — SIMVASTATIN 20 MG PO TABS
20.0000 mg | ORAL_TABLET | Freq: Every day | ORAL | Status: DC
  Administered 2012-11-18 – 2012-11-20 (×3): 20 mg via ORAL
  Filled 2012-11-18 (×4): qty 1

## 2012-11-18 MED ORDER — DEXAMETHASONE SODIUM PHOSPHATE 10 MG/ML IJ SOLN
10.0000 mg | Freq: Once | INTRAMUSCULAR | Status: AC
  Administered 2012-11-19: 10 mg via INTRAVENOUS
  Filled 2012-11-18: qty 1

## 2012-11-18 MED ORDER — CEFAZOLIN SODIUM 1-5 GM-% IV SOLN
1.0000 g | Freq: Four times a day (QID) | INTRAVENOUS | Status: AC
  Administered 2012-11-18 (×2): 1 g via INTRAVENOUS
  Filled 2012-11-18 (×4): qty 50

## 2012-11-18 SURGICAL SUPPLY — 54 items
BAG SPEC THK2 15X12 ZIP CLS (MISCELLANEOUS) ×1
BAG ZIPLOCK 12X15 (MISCELLANEOUS) ×2 IMPLANT
BANDAGE ELASTIC 6 VELCRO ST LF (GAUZE/BANDAGES/DRESSINGS) ×2 IMPLANT
BANDAGE ESMARK 6X9 LF (GAUZE/BANDAGES/DRESSINGS) ×1 IMPLANT
BLADE SAG 18X100X1.27 (BLADE) ×2 IMPLANT
BLADE SAW SGTL 11.0X1.19X90.0M (BLADE) ×2 IMPLANT
BNDG CMPR 9X6 STRL LF SNTH (GAUZE/BANDAGES/DRESSINGS) ×1
BNDG ESMARK 6X9 LF (GAUZE/BANDAGES/DRESSINGS) ×2
BOWL SMART MIX CTS (DISPOSABLE) ×2 IMPLANT
CATH KIT ON-Q SILVERSOAK 5 (CATHETERS) ×1 IMPLANT
CATH KIT ON-Q SILVERSOAK 5IN (CATHETERS) ×2 IMPLANT
CEMENT HV SMART SET (Cement) ×4 IMPLANT
CLOTH BEACON ORANGE TIMEOUT ST (SAFETY) ×2 IMPLANT
CUFF TOURN SGL QUICK 34 (TOURNIQUET CUFF) ×2
CUFF TRNQT CYL 34X4X40X1 (TOURNIQUET CUFF) ×1 IMPLANT
DRAPE EXTREMITY T 121X128X90 (DRAPE) ×2 IMPLANT
DRAPE POUCH INSTRU U-SHP 10X18 (DRAPES) ×2 IMPLANT
DRAPE U-SHAPE 47X51 STRL (DRAPES) ×2 IMPLANT
DRSG ADAPTIC 3X8 NADH LF (GAUZE/BANDAGES/DRESSINGS) ×2 IMPLANT
DRSG PAD ABDOMINAL 8X10 ST (GAUZE/BANDAGES/DRESSINGS) ×1 IMPLANT
DURAPREP 26ML APPLICATOR (WOUND CARE) ×2 IMPLANT
ELECT REM PT RETURN 9FT ADLT (ELECTROSURGICAL) ×2
ELECTRODE REM PT RTRN 9FT ADLT (ELECTROSURGICAL) ×1 IMPLANT
EVACUATOR 1/8 PVC DRAIN (DRAIN) ×2 IMPLANT
FACESHIELD LNG OPTICON STERILE (SAFETY) ×10 IMPLANT
GLOVE BIO SURGEON STRL SZ8 (GLOVE) ×2 IMPLANT
GLOVE BIOGEL PI IND STRL 8 (GLOVE) ×2 IMPLANT
GLOVE BIOGEL PI INDICATOR 8 (GLOVE) ×2
GLOVE ECLIPSE 8.0 STRL XLNG CF (GLOVE) ×2 IMPLANT
GLOVE SURG SS PI 6.5 STRL IVOR (GLOVE) ×4 IMPLANT
GOWN STRL NON-REIN LRG LVL3 (GOWN DISPOSABLE) ×4 IMPLANT
GOWN STRL REIN XL XLG (GOWN DISPOSABLE) ×2 IMPLANT
HANDPIECE INTERPULSE COAX TIP (DISPOSABLE) ×2
IMMOBILIZER KNEE 20 (SOFTGOODS) ×2
IMMOBILIZER KNEE 20 THIGH 36 (SOFTGOODS) ×1 IMPLANT
KIT BASIN OR (CUSTOM PROCEDURE TRAY) ×2 IMPLANT
MANIFOLD NEPTUNE II (INSTRUMENTS) ×2 IMPLANT
NS IRRIG 1000ML POUR BTL (IV SOLUTION) ×2 IMPLANT
PACK TOTAL JOINT (CUSTOM PROCEDURE TRAY) ×2 IMPLANT
PAD ABD 7.5X8 STRL (GAUZE/BANDAGES/DRESSINGS) ×2 IMPLANT
PADDING CAST COTTON 6X4 STRL (CAST SUPPLIES) ×5 IMPLANT
POSITIONER SURGICAL ARM (MISCELLANEOUS) ×2 IMPLANT
SET HNDPC FAN SPRY TIP SCT (DISPOSABLE) ×1 IMPLANT
SPONGE GAUZE 4X4 12PLY (GAUZE/BANDAGES/DRESSINGS) ×2 IMPLANT
STRIP CLOSURE SKIN 1/2X4 (GAUZE/BANDAGES/DRESSINGS) ×4 IMPLANT
SUCTION FRAZIER 12FR DISP (SUCTIONS) ×2 IMPLANT
SUT MNCRL AB 4-0 PS2 18 (SUTURE) ×2 IMPLANT
SUT VIC AB 2-0 CT1 27 (SUTURE) ×6
SUT VIC AB 2-0 CT1 TAPERPNT 27 (SUTURE) ×3 IMPLANT
SUT VLOC 180 0 24IN GS25 (SUTURE) ×2 IMPLANT
TOWEL OR 17X26 10 PK STRL BLUE (TOWEL DISPOSABLE) ×4 IMPLANT
TRAY FOLEY CATH 14FRSI W/METER (CATHETERS) ×2 IMPLANT
WATER STERILE IRR 1500ML POUR (IV SOLUTION) ×2 IMPLANT
WRAP KNEE MAXI GEL POST OP (GAUZE/BANDAGES/DRESSINGS) ×4 IMPLANT

## 2012-11-18 NOTE — Interval H&P Note (Signed)
History and Physical Interval Note:  11/18/2012 8:20 AM  Cynthia Guzman  has presented today for surgery, with the diagnosis of osteoarthritis right knee  The various methods of treatment have been discussed with the patient and family. After consideration of risks, benefits and other options for treatment, the patient has consented to  Procedure(s) (LRB) with comments: TOTAL KNEE ARTHROPLASTY (Right) as a surgical intervention .  The patient's history has been reviewed, patient examined, no change in status, stable for surgery.  I have reviewed the patient's chart and labs.  Questions were answered to the patient's satisfaction.     Loanne Drilling

## 2012-11-18 NOTE — Anesthesia Postprocedure Evaluation (Signed)
  Anesthesia Post-op Note  Patient: [name]  Procedure(s) Performed: Procedure(s) (LRB): TOTAL KNEE ARTHROPLASTY (Right)  Patient Location: PACU  Anesthesia Type: Spinal  Level of Consciousness: awake and alert   Airway and Oxygen Therapy: Patient Spontanous Breathing  Post-op Pain: mild  Post-op Assessment: Post-op Vital signs reviewed, Patient's Cardiovascular Status Stable, Respiratory Function Stable, Patent Airway and No signs of Nausea or vomiting  Last Vitals:  Filed Vitals:   11/18/12 1215  BP: 137/58  Pulse: 54  Temp:   Resp: 11    Post-op Vital Signs: stable   Complications: No apparent anesthesia complications

## 2012-11-18 NOTE — Anesthesia Preprocedure Evaluation (Signed)
Anesthesia Evaluation  Patient identified by MRN, date of birth, ID band Patient awake    Reviewed: Allergy & Precautions, H&P , NPO status , Patient's Chart, lab work & pertinent test results  Airway Mallampati: II TM Distance: <3 FB Neck ROM: Full    Dental No notable dental hx.    Pulmonary neg pulmonary ROS,  breath sounds clear to auscultation  Pulmonary exam normal       Cardiovascular hypertension, Pt. on medications Rhythm:Regular Rate:Normal     Neuro/Psych negative neurological ROS  negative psych ROS   GI/Hepatic negative GI ROS, Neg liver ROS,   Endo/Other  negative endocrine ROS  Renal/GU negative Renal ROS  negative genitourinary   Musculoskeletal negative musculoskeletal ROS (+)   Abdominal   Peds negative pediatric ROS (+)  Hematology negative hematology ROS (+)   Anesthesia Other Findings   Reproductive/Obstetrics negative OB ROS                           Anesthesia Physical Anesthesia Plan  ASA: II  Anesthesia Plan: Spinal   Post-op Pain Management:    Induction: Intravenous  Airway Management Planned: Simple Face Mask  Additional Equipment:   Intra-op Plan:   Post-operative Plan:   Informed Consent: I have reviewed the patients History and Physical, chart, labs and discussed the procedure including the risks, benefits and alternatives for the proposed anesthesia with the patient or authorized representative who has indicated his/her understanding and acceptance.     Plan Discussed with: CRNA and Surgeon  Anesthesia Plan Comments:         Anesthesia Quick Evaluation

## 2012-11-18 NOTE — Anesthesia Procedure Notes (Signed)
Spinal  Patient location during procedure: OR Staffing Performed by: anesthesiologist  Preanesthetic Checklist Completed: patient identified, site marked, surgical consent, pre-op evaluation, timeout performed, IV checked, risks and benefits discussed and monitors and equipment checked Spinal Block Patient position: sitting Prep: Betadine Patient monitoring: heart rate, continuous pulse ox and blood pressure Injection technique: single-shot Needle Needle type: Quincke  Needle gauge: 22 G Needle length: 9 cm Additional Notes Expiration date of kit checked and confirmed. Patient tolerated procedure well, without complications.     

## 2012-11-18 NOTE — Preoperative (Signed)
Beta Blockers   Reason not to administer Beta Blockers:Took Lopressor last pm at 2100.

## 2012-11-18 NOTE — Transfer of Care (Signed)
Immediate Anesthesia Transfer of Care Note  Patient: Cynthia Guzman  Procedure(s) Performed: Procedure(s) (LRB) with comments: TOTAL KNEE ARTHROPLASTY (Right)  Patient Location: PACU  Anesthesia Type:Spinal  Level of Consciousness: awake, alert , oriented and patient cooperative  Airway & Oxygen Therapy: Patient Spontanous Breathing and Patient connected to face mask oxygen  Post-op Assessment: Report given to PACU RN, Post -op Vital signs reviewed and stable and SAB level T11 and able to wiggle toes.  Post vital signs: Reviewed and stable  Complications: No apparent anesthesia complications

## 2012-11-18 NOTE — Progress Notes (Signed)
Pt. Stated she had 3-4 episodes of diarrhea this am. States she has a history of this and usually it ocures once a month. States this is not unusual for her. Denies any other symptoms.

## 2012-11-18 NOTE — Addendum Note (Signed)
Addendum  created 11/18/12 1232 by George Rose, MD   Modules edited:Anesthesia Blocks and Procedures, Anesthesia Medication Administration, Inpatient Notes    

## 2012-11-18 NOTE — Plan of Care (Signed)
Problem: Consults Goal: Diagnosis- Total Joint Replacement Outcome: Completed/Met Date Met:  11/18/12 Primary Total Knee     

## 2012-11-18 NOTE — Op Note (Signed)
Pre-operative diagnosis- Osteoarthritis  Right knee(s)  Post-operative diagnosis- Osteoarthritis Right knee(s)  Procedure-  Right  Total Knee Arthroplasty  Surgeon- Gus Rankin. Aluisio, MD  Assistant- Dimitri Ped, PA-C   Anesthesia-  Spinal EBL-* No blood loss amount entered *  Drains Hemovac  Tourniquet time-  Total Tourniquet Time Documented: Thigh (Right) - 32 minutes   Complications- None  Condition-PACU - hemodynamically stable.   Brief Clinical Note  Cynthia Guzman is a 78 y.o. year old female with end stage OA of her right knee with progressively worsening pain and dysfunction. She has constant pain, with activity and at rest and significant functional deficits with difficulties even with ADLs. She has had extensive non-op management including analgesics, injections of cortisone and viscosupplements, and home exercise program, but remains in significant pain with significant dysfunction.Radiographs show bone on bone arthritis tricompartmental in appearance. She presents now for right Total Knee Arthroplasty.    Procedure in detail---   The patient is brought into the operating room and positioned supine on the operating table. After successful administration of  Spinal,   a tourniquet is placed high on the  Right thigh(s) and the lower extremity is prepped and draped in the usual sterile fashion. Time out is performed by the operating team and then the  Right lower extremity is wrapped in Esmarch, knee flexed and the tourniquet inflated to 300 mmHg.       A midline incision is made with a ten blade through the subcutaneous tissue to the level of the extensor mechanism. A fresh blade is used to make a medial parapatellar arthrotomy. Soft tissue over the proximal medial tibia is subperiosteally elevated to the joint line with a knife and into the semimembranosus bursa with a Cobb elevator. Soft tissue over the proximal lateral tibia is elevated with attention being paid to  avoiding the patellar tendon on the tibial tubercle. The patella is everted, knee flexed 90 degrees and the ACL and PCL are removed. Findings are bone on bone all 3 compartments with large global osteophyte formation.        The drill is used to create a starting hole in the distal femur and the canal is thoroughly irrigated with sterile saline to remove the fatty contents. The 5 degree Right  valgus alignment guide is placed into the femoral canal and the distal femoral cutting block is pinned to remove 10 mm off the distal femur. Resection is made with an oscillating saw.      The tibia is subluxed forward and the menisci are removed. The extramedullary alignment guide is placed referencing proximally at the medial aspect of the tibial tubercle and distally along the second metatarsal axis and tibial crest. The block is pinned to remove 2mm off the more deficient medial  side. Resection is made with an oscillating saw. Size 2.5is the most appropriate size for the tibia and the proximal tibia is prepared with the modular drill and keel punch for that size.      The femoral sizing guide is placed and size 2.5 is most appropriate. Rotation is marked off the epicondylar axis and confirmed by creating a rectangular flexion gap at 90 degrees. The size 2.5 cutting block is pinned in this rotation and the anterior, posterior and chamfer cuts are made with the oscillating saw. The intercondylar block is then placed and that cut is made.      Trial size 2.5 tibial component, trial size 2.5 posterior stabilized femur and a 10  mm posterior stabilized rotating platform insert trial is placed. Full extension is achieved with excellent varus/valgus and anterior/posterior balance throughout full range of motion. The patella is everted and thickness measured to be 22  mm. Free hand resection is taken to 12 mm, a 38 template is placed, lug holes are drilled, trial patella is placed, and it tracks normally. Osteophytes are  removed off the posterior femur with the trial in place. All trials are removed and the cut bone surfaces prepared with pulsatile lavage. Cement is mixed and once ready for implantation, the size 2.5 tibial implant, size  2.5 posterior stabilized femoral component, and the size 38 patella are cemented in place and the patella is held with the clamp. The trial insert is placed and the knee held in full extension. All extruded cement is removed and once the cement is hard the permanent 10 mm posterior stabilized rotating platform insert is placed into the tibial tray.      The wound is copiously irrigated with saline solution and the extensor mechanism closed over a hemovac drain with #1 PDS suture. The tourniquet is released for a total tourniquet time of 32  minutes. Flexion against gravity is 140 degrees and the patella tracks normally. Subcutaneous tissue is closed with 2.0 vicryl and subcuticular with running 4.0 Monocryl. The catheter for the Marcaine pain pump is placed and the pump is initiated. The incision is cleaned and dried and steri-strips and a bulky sterile dressing are applied. The limb is placed into a knee immobilizer and the patient is awakened and transported to recovery in stable condition.      Please note that a surgical assistant was a medical necessity for this procedure in order to perform it in a safe and expeditious manner. Surgical assistant was necessary to retract the ligaments and vital neurovascular structures to prevent injury to them and also necessary for proper positioning of the limb to allow for anatomic placement of the prosthesis.   Gus Rankin Aluisio, MD    11/18/2012, 11:35 AM

## 2012-11-18 NOTE — Progress Notes (Signed)
Physical Therapy Note  Order received. Chart reviewed. Pt not ready for PT eval POD 0 per RN. Will check back on tomorrow. Thanks. Rebeca Alert, PT [phone]

## 2012-11-18 NOTE — Addendum Note (Signed)
Addendum  created 11/18/12 1232 by Eilene Ghazi, MD   Modules edited:Anesthesia Blocks and Procedures, Anesthesia Medication Administration, Inpatient Notes

## 2012-11-18 NOTE — H&P (View-Only) (Signed)
Providence Crosby  DOB: 22-Nov-1932 Married / Language: English / Race: White Female  Date of Admission:  11/18/12  Chief Complaint:  Right Knee Pain  History of Present Illness The patient is a 78 year old female who comes in for a preoperative History and Physical. The patient is scheduled for a right total knee arthroplasty to be performed by Dr. Gus Rankin. Aluisio, MD at [facility] on 11/18/12. The patient is a 78 year old female who presents with knee complaints. The patient reports right knee symptoms including: pain (posterior), swelling, instability, catching (popping), giving way, weakness, stiffness, soreness and grinding which began year(s) ago without any known injury. Note for "Knee pain": She has gotten to the point now that she needed to be evaluated for the pain and instability. It has been several years since I saw Ms. Krizek. Her right knee has gotten progressively worse. She use to respond to injections but for the past few injections she did not get any benefit at all. She is at a stage now where the knee is hurting her all the time. She states it is limiting what she can and can not do. It hurts her at night. She would like to be more active but can not do so because of the knee. She has had multiple cortisone injections. She is ready to proceed with surgery. They have been treated conservatively in the past for the above stated problem and despite conservative measures, they continue to have progressive pain and severe functional limitations and dysfunction. They have failed non-operative management including home exercise, medications, and injections. It is felt that they would benefit from undergoing total joint replacement. Risks and benefits of the procedure have been discussed with the patient and they elect to proceed with surgery. There are no active contraindications to surgery such as ongoing infection or rapidly progressive neurological  disease.   Problem List/Past Medical Osteoarthritis, knee (715.96)   Allergies NSAIDs. Nausea.   Family History Cerebrovascular Accident. grandmother mothers side Drug / Alcohol Addiction. father   Social History Exercise. Exercises never Illicit drug use. no Living situation. live with spouse Children. 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 married Number of flights of stairs before winded. 1 Tobacco / smoke exposure. no Tobacco use. former smoker; smoke(d) 1 pack(s) per day Alcohol use. never consumed alcohol Post-Surgical Plans. Plan is to go home with husband.   Medication History AmLODIPine Besylate (5MG  Tablet, Oral) Active. Sertraline HCl (100MG  Tablet, Oral) Active. Simvastatin (20MG  Tablet, Oral) Active. Metoprolol Tartrate (25MG  Tablet, Oral) Active. Morphine Sulfate IR (15MG  Tablet, Oral) Active. Aspirin Childrens (81MG  Tablet Chewable, Oral) Active. Vitamin D3 (2000UNIT Capsule, Oral) Active. Latanoprost (0.005% Solution, Ophthalmic) Active.   Pregnancy / Birth History Pregnant. no   Past Surgical History Rotator Cuff Repair. bilateral Spinal Surgery. 3 times Cataract Surgery. bilateral Hemorrhoidectomy Hysterectomy. partial (non-cancerous)   Medical History High blood pressure Hypercholesterolemia Chronic Pain Fibromyalgia Impaired Hearing   Review of Systems General:Not Present- Chills, Fever, Night Sweats, Fatigue, Weight Gain, Weight Loss and Memory Loss. Skin:Not Present- Hives, Itching, Rash, Eczema and Lesions. HEENT:Not Present- Tinnitus, Headache, Double Vision, Visual Loss, Hearing Loss and Dentures. Respiratory:Not Present- Shortness of breath with exertion, Shortness of breath at rest, Allergies, Coughing up blood and Chronic Cough. Cardiovascular:Not Present- Chest Pain, Racing/skipping heartbeats, Difficulty Breathing Lying Down, Murmur,  Swelling and Palpitations. Gastrointestinal:Not Present- Bloody Stool, Heartburn, Abdominal Pain, Vomiting, Nausea, Constipation, Diarrhea, Difficulty Swallowing, Jaundice and Loss of  appetitie. Female Genitourinary:Not Present- Blood in Urine, Urinary frequency, Weak urinary stream, Discharge, Flank Pain, Incontinence, Painful Urination, Urgency, Urinary Retention and Urinating at Night. Musculoskeletal:Not Present- Muscle Weakness, Muscle Pain, Joint Swelling, Joint Pain, Back Pain, Morning Stiffness and Spasms. Neurological:Not Present- Tremor, Dizziness, Blackout spells, Paralysis, Difficulty with balance and Weakness. Psychiatric:Not Present- Insomnia.   Vitals Pulse: 60 (Regular) Resp.: 16 (Unlabored) BP: 118/58 (Sitting, Right Arm, Standard)    Physical Exam The physical exam findings are as follows:  Note: Patient is a 78 year old female with continued knee pain. Patient is accompanied today by her husband. Patient is hard of hearing.   General Mental Status - Alert, cooperative and good historian. General Appearance- pleasant. Not in acute distress. Orientation- Oriented X3. Build & Nutrition- Well nourished and Well developed.   Head and Neck Head- normocephalic, atraumatic . Neck Global Assessment- bruit auscultated on the right and supple. no bruit auscultated on the left. Carotid Arteries- Right- bruit.   Eye Pupil- Bilateral- Regular and Round. Note: wears glasses Motion- Bilateral- EOMI.   ENMT  upper and lower dentures  Chest and Lung Exam Auscultation: Breath sounds:- clear at anterior chest wall and - clear at posterior chest wall. Adventitious sounds:- No Adventitious sounds.   Cardiovascular Auscultation:Rhythm- Regular rate and rhythm. Heart Sounds- S1 WNL and S2 WNL. Murmurs & Other Heart Sounds:Auscultation of the heart reveals - No Murmurs.   Abdomen Palpation/Percussion:Tenderness- Abdomen is  non-tender to palpation. Rigidity (guarding)- Abdomen is soft. Auscultation:Auscultation of the abdomen reveals - Bowel sounds normal.   Female Genitourinary Not done, not pertinent to present illness  Musculoskeletal She is a well developed female. No distress. Both hips are with a normal range of motion with no discomfort. The right knee is with slight valgus. Range is 5-120. Moderate crepitus on range of motion. Tender medial and lateral with no instability noted. The left knee 0-125. Minimal crepitus on range of motion. No joint line tenderness or instability. Pulses, sensation and motor are intact in both lower extremities.  RADIOGRAPHS: AP of both knees and lateral show severe end stage arthritis of her right knee, tricompartmental changes worse laterally with a slight valgus. She has severe patellofemoral arthritis also. The left knee is unremarkable.  Assessment & Plan Osteoarthritis, knee (715.96) Impression: Right Knee  Note: Plan is for a right total knee replacement by Dr. Lequita Halt.  Plan is to go home.  PCP - Dr. Wylene Simmer - Patient has been seen preoperatively and felt to be stable for surgery.  Please note, at the time of her H&P, the patient stated that she had a fall about three weeks ago at her home on the porch. She denies any syncopal or presyncopal episode associated with the fall. She states that she did hit her head and developed dizziness soon thereafter. She had the dizziness feeling for about a week before it resolved. She denied having any visual changes during that week, and also denied nausea with the episode. She brings this up at her visist. Due to having a head injury and dizziness lasting almost a week, it was recommended that she go to her medical physician for evaluation prior to undergoing anesthesia, i.e. possible spinal anesthesia for knee repalcement.  Signed electronically by Roberts Gaudy, PA-C

## 2012-11-19 ENCOUNTER — Encounter (HOSPITAL_COMMUNITY): Payer: Self-pay | Admitting: Orthopedic Surgery

## 2012-11-19 DIAGNOSIS — D62 Acute posthemorrhagic anemia: Secondary | ICD-10-CM | POA: Diagnosis not present

## 2012-11-19 LAB — BASIC METABOLIC PANEL
BUN: 11 mg/dL (ref 6–23)
CO2: 28 mEq/L (ref 19–32)
Calcium: 8.8 mg/dL (ref 8.4–10.5)
Chloride: 101 mEq/L (ref 96–112)
Creatinine, Ser: 0.75 mg/dL (ref 0.50–1.10)
GFR calc Af Amer: >90 mL/min (ref >90)
GFR calc non Af Amer: 80 mL/min — ABNORMAL LOW (ref >90)
Glucose, Bld: 127 mg/dL — ABNORMAL HIGH (ref 70–99)
Potassium: 3.9 mEq/L (ref 3.5–5.1)
Sodium: 135 mEq/L (ref 135–145)

## 2012-11-19 LAB — CBC
HCT: 27.4 % — ABNORMAL LOW (ref 36.0–46.0)
Hemoglobin: 9.1 g/dL — ABNORMAL LOW (ref 12.0–15.0)
MCH: 27.2 pg (ref 26.0–34.0)
MCHC: 33.2 g/dL (ref 30.0–36.0)
MCV: 81.8 fL (ref 78.0–100.0)
Platelets: 190 10*3/uL (ref 150–400)
RBC: 3.35 MIL/uL — ABNORMAL LOW (ref 3.87–5.11)
RDW: 13 % (ref 11.5–15.5)
WBC: 10.9 10*3/uL — ABNORMAL HIGH (ref 4.0–10.5)

## 2012-11-19 MED ORDER — NICOTINE 21 MG/24HR TD PT24
21.0000 mg | MEDICATED_PATCH | Freq: Every day | TRANSDERMAL | Status: DC
  Filled 2012-11-19: qty 1

## 2012-11-19 MED ORDER — SODIUM CHLORIDE 0.9 % IV SOLN: INTRAVENOUS | Status: DC

## 2012-11-19 NOTE — Progress Notes (Signed)
Physical Therapy Treatment Note   11/19/12 1500  PT Visit Information  Last PT Received On 11/19/12  Assistance Needed +2  PT Time Calculation  PT Start Time 1318  PT Stop Time 1345  PT Time Calculation (min) 27 min  Subjective Data  Subjective I didn't do too well with that other lady.  (OT)  Precautions  Precautions Fall;Knee  Required Braces or Orthoses Knee Immobilizer - Right  Knee Immobilizer - Right Discontinue once straight leg raise with < 10 degree lag  Restrictions  RLE Weight Bearing WBAT  Cognition  Overall Cognitive Status Appears within functional limits for tasks assessed/performed  Bed Mobility  Bed Mobility Sit to Supine  Sit to Supine 4: Min assist;HOB flat  Details for Bed Mobility Assistance assist for R LE  Transfers  Transfers Stand to Sit;Sit to Stand  Sit to Stand 3: Mod assist;From chair/3-in-1;With upper extremity assist  Stand to Sit 4: Min assist;With upper extremity assist;To bed  Details for Transfer Assistance +2 for safety, verbal cues safe technique, assist to rise and control descent  Ambulation/Gait  Ambulation/Gait Assistance 4: Min assist  Ambulation Distance (Feet) 20 Feet  Assistive device Rolling walker  Ambulation/Gait Assistance Details verbal cues for sequence and using UEs, pt reporting increased pain in R knee  Gait Pattern Step-to pattern;Antalgic  Exercises  Exercises Total Joint  Total Joint Exercises  Ankle Circles/Pumps AROM;15 reps;Both  Quad Sets AROM;15 reps;Both  Gluteal Sets AROM;Both;15 reps  Towel Squeeze AROM;15 reps;Both  Short Arc Quad AAROM;15 reps;Right  Heel Slides AAROM;Right;10 reps  Hip ABduction/ADduction AAROM;Right;15 reps  Straight Leg Raises AAROM;Right;10 reps  Goniometric ROM R knee AAROM approx -10-35* limited by pain  PT - End of Session  Equipment Utilized During Treatment Right knee immobilizer  Activity Tolerance Patient limited by fatigue;Patient limited by pain  Patient left in bed;with  call bell/phone within reach  PT - Assessment/Plan  Comments on Treatment Session Pt ambulated short distance and performed exercises.  Pt reported 10/10 R knee pain after exercises and ice applied and pt premedicated.  PT Plan Discharge plan remains appropriate;Frequency remains appropriate  Follow Up Recommendations Supervision/Assistance - 24 hour;SNF  Equipment Recommended None recommended by PT;None recommended by OT  Acute Rehab PT Goals  PT Goal: Sit to Supine/Side - Progress Progressing toward goal  PT Goal: Sit to Stand - Progress Progressing toward goal  PT Goal: Stand to Sit - Progress Progressing toward goal  PT Goal: Ambulate - Progress Progressing toward goal  PT Goal: Perform Home Exercise Program - Progress Progressing toward goal  PT General Charges  $$ ACUTE PT VISIT 1 Procedure  PT Treatments  $Gait Training 8-22 mins  $Therapeutic Exercise 8-22 mins    Zenovia Jarred, PT Pager: [phone]

## 2012-11-19 NOTE — Progress Notes (Signed)
   Subjective: 1 Day Post-Op Procedure(s) (LRB): TOTAL KNEE ARTHROPLASTY (Right) Patient reports pain as moderate and severe.   Patient seen in rounds with Dr. Lequita Halt.  Patient is doing a little better this morning. Patient is having problems with pain in the knee, requiring pain medications We will start therapy today. Pain pump came out. Plan is to go Home after hospital stay.  Objective: Vital signs in last 24 hours: Temp:  [97.9 F (36.6 C)-99.2 F (37.3 C)] 98.9 F (37.2 C) (11/26 0551) Pulse Rate:  [54-67] 63  (11/26 0551) Resp:  [9-16] 14  (11/26 0551) BP: (117-190)/(54-88) 136/55 mmHg (11/26 0551) SpO2:  [90 %-100 %] 90 % (11/26 0551) Weight:  [77.111 kg (170 lb)] 77.111 kg (170 lb) (11/25 1310)  Intake/Output from previous day:  Intake/Output Summary (Last 24 hours) at 11/19/12 0749 Last data filed at 11/19/12 0600  Gross per 24 hour  Intake   3120 ml  Output   1245 ml  Net   1875 ml    Intake/Output this shift: UOP 350 +1875  Labs:  [facility] 11/19/12 0417  HGB 9.1*    Basename 11/19/12 0417  WBC 10.9*  RBC 3.35*  HCT 27.4*  PLT 190    Basename 11/19/12 0417  NA 135  K 3.9  CL 101  CO2 28  BUN 11  CREATININE 0.75  GLUCOSE 127*  CALCIUM 8.8   No results found for this basename: LABPT:2,INR:2 in the last 72 hours  EXAM General - Patient is Alert, Appropriate and Oriented Extremity - Neurovascular intact Sensation intact distally Dorsiflexion/Plantar flexion intact Dressing - dressing C/D/I Motor Function - intact, moving foot and toes well on exam.  Hemovac pulled without difficulty.  Past Medical History  Diagnosis Date  . Osteoporosis   . Depression   . Hypertension   . Chronic back pain   . eczema   . Memory impairment   . Hearing impairment   . Complication of anesthesia     hard to wake up  . Glaucoma     Assessment/Plan: 1 Day Post-Op Procedure(s) (LRB): TOTAL KNEE ARTHROPLASTY (Right) Principal Problem:  *OA  (osteoarthritis) of knee Active Problems:  Postop Acute blood loss anemia  Estimated Body mass index is 32.12 kg/(m^2) as calculated from the following:   Height as of this encounter: 5\' 1" (1.549 m).   Weight as of this encounter: 170 lb(77.111 kg). Advance diet Up with therapy Discharge home with home health  DVT Prophylaxis - Xarelto, 81 mg Aspirin on hold Weight-Bearing as tolerated to right leg No vaccines. D/C O2 and Pulse OX and try on Room [address]  Patrica Duel 11/19/2012, 7:49 AM

## 2012-11-19 NOTE — Progress Notes (Signed)
Utilization review completed.  

## 2012-11-19 NOTE — Evaluation (Signed)
Physical Therapy Evaluation Patient Details Name: Cynthia Guzman MRN: [id number] DOB: 1932-01-11 Today's Date: 11/19/2012 Time: 4098-1191 PT Time Calculation (min): 14 min  PT Assessment / Plan / Recommendation Clinical Impression  Pt s/p R TKA.  Pt would benefit from acute PT services in order to improve independence with transfers and ambulation by improving R LE strength and ROM to prepare for d/c.  Pt reports she plans to d/c home.  Recommend SNF at this time 2* mod assist level.    PT Assessment  Patient needs continued PT services    Follow Up Recommendations  Supervision/Assistance - 24 hour;SNF    Does the patient have the potential to tolerate intense rehabilitation      Barriers to Discharge        Equipment Recommendations  None recommended by PT    Recommendations for Other Services     Frequency 7X/week    Precautions / Restrictions Precautions Precautions: Fall;Knee Required Braces or Orthoses: Knee Immobilizer - Right Knee Immobilizer - Right: Discontinue once straight leg raise with < 10 degree lag Restrictions Weight Bearing Restrictions: No RLE Weight Bearing: Weight bearing as tolerated   Pertinent Vitals/Pain 9/10 R knee after ambulation, premedicated, declined more meds, ice applied      Mobility  Bed Mobility Bed Mobility: Supine to Sit Supine to Sit: 4: Min assist;HOB elevated Details for Bed Mobility Assistance: assist for R LE Transfers Transfers: Stand to Sit;Sit to Stand Sit to Stand: 1: +2 Total assist;With upper extremity assist;From bed Sit to Stand: Patient Percentage: 70% Stand to Sit: 1: +2 Total assist;To chair/3-in-1;With upper extremity assist Stand to Sit: Patient Percentage: 70% Details for Transfer Assistance: verbal cues for safe technique Ambulation/Gait Ambulation/Gait Assistance: 1: +2 Total assist Ambulation/Gait: Patient Percentage: 70% Ambulation Distance (Feet): 9 Feet Assistive device: Rolling  walker Ambulation/Gait Assistance Details: verbal cues for sequence, RW distance, step length, increased cues to push down through RW to assist with pain, increased time Gait Pattern: Step-to pattern;Antalgic    Shoulder Instructions     Exercises     PT Diagnosis: Difficulty walking;Acute pain  PT Problem List: Decreased strength;Decreased range of motion;Decreased activity tolerance;Decreased mobility;Pain;Decreased knowledge of use of DME PT Treatment Interventions: DME instruction;Gait training;Functional mobility training;Patient/family education;Therapeutic activities;Therapeutic exercise   PT Goals Acute Rehab PT Goals PT Goal Formulation: With patient Time For Goal Achievement: 11/26/12 Potential to Achieve Goals: Good Pt will go Supine/Side to Sit: with supervision PT Goal: Supine/Side to Sit - Progress: Goal set today Pt will go Sit to Supine/Side: with supervision PT Goal: Sit to Supine/Side - Progress: Goal set today Pt will go Sit to Stand: with supervision PT Goal: Sit to Stand - Progress: Goal set today Pt will go Stand to Sit: with supervision PT Goal: Stand to Sit - Progress: Goal set today Pt will Ambulate: 51 - 150 feet;with supervision;with least restrictive assistive device PT Goal: Ambulate - Progress: Goal set today Pt will Perform Home Exercise Program: with supervision, verbal cues required/provided PT Goal: Perform Home Exercise Program - Progress: Goal set today  Visit Information  Last PT Received On: 11/19/12 Assistance Needed: +2    Subjective Data  Subjective: I'm going home when I leave here.   Prior Functioning  Home Living Lives With: Spouse Type of Home: House Home Access: Level entry Home Layout: One level Home Adaptive Equipment: Bedside commode/3-in-1;Walker - rolling;Other (comment) (lift chair, large based quad cane) Prior Function Level of Independence: Independent with assistive device(s) Comments: Pt reports using  LBQC  presurgery Communication Communication: HOH    Cognition  Overall Cognitive Status: Appears within functional limits for tasks assessed/performed Arousal/Alertness: Awake/alert Orientation Level: Appears intact for tasks assessed Behavior During Session: [facility] for tasks performed    Extremity/Trunk Assessment Right Lower Extremity Assessment RLE ROM/Strength/Tone: Deficits RLE ROM/Strength/Tone Deficits: unable to lift LE against gravity, maintained KI Left Lower Extremity Assessment LLE ROM/Strength/Tone: WFL for tasks assessed   Balance    End of Session PT - End of Session Equipment Utilized During Treatment: Right knee immobilizer Activity Tolerance: Patient limited by fatigue;Patient limited by pain Patient left: in chair;with call bell/phone within reach CPM Right Knee CPM Right Knee: Off Right Knee Flexion (Degrees): 40  Right Knee Extension (Degrees): 10   GP     LEMYRE,KATHrine E 11/19/2012, 10:42 AM Pager: [phone]

## 2012-11-19 NOTE — Evaluation (Signed)
Occupational Therapy Evaluation Patient Details Name: Cynthia Guzman MRN: [id number] DOB: [birth year] Today's Date: 11/19/2012 Time: 4098-1191 OT Time Calculation (min): 21 min  OT Assessment / Plan / Recommendation Clinical Impression  This 78 y.o. female admitted for Rt. TKA and h/o frequent falls PTA.  Pt demonstrates the below listed deficits and will benefit from acute OT to maximize safety and independence with BADLs to allow pt. to return home modified independently after SNF level rehab.  Pt. is moving slowly and doubt she will be able to return home safely without SNF    OT Assessment  Patient needs continued OT Services    Follow Up Recommendations  SNF;Supervision/Assistance - 24 hour    Barriers to Discharge Decreased caregiver support    Equipment Recommendations  None recommended by PT;None recommended by OT    Recommendations for Other Services    Frequency  Min 2X/week    Precautions / Restrictions Precautions Precautions: Fall;Knee Required Braces or Orthoses: Knee Immobilizer - Right Knee Immobilizer - Right: Discontinue once straight leg raise with < 10 degree lag Restrictions Weight Bearing Restrictions: No RLE Weight Bearing: Weight bearing as tolerated       ADL  Eating/Feeding: Independent Where Assessed - Eating/Feeding: Chair Grooming: Wash/dry hands;Wash/dry face;Teeth care;Brushing hair;Modified independent Where Assessed - Grooming: Supported sitting Upper Body Bathing: Minimal assistance Where Assessed - Upper Body Bathing: Supported sitting Lower Body Bathing: Maximal assistance Where Assessed - Lower Body Bathing: Supported sit to stand Upper Body Dressing: Minimal assistance Where Assessed - Upper Body Dressing: Supported sitting Lower Body Dressing: +1 Total assistance Where Assessed - Lower Body Dressing: Supported sit to Pharmacist, hospital: +1 Total assistance (unable to safely perform with +1 assist) Toilet Transfer Method: Sit  to stand Toileting - Clothing Manipulation and Hygiene: +1 Total assistance Where Assessed - Glass blower/designer Manipulation and Hygiene: Standing Tub/Shower Transfer: +1 Total assistance (unable to safely perform) Equipment Used: Knee Immobilizer;Rolling walker Transfers/Ambulation Related to ADLs: Pt. requires mod A to move sit to stand, but unable to sustain, and falls backwards without warning.  Pt unsafe to attempt transfers with +1 assist ADL Comments: Pt. able to bend forward to reach ankle in prep for LB ADLs.  Unable to maintain standing for LB ADLs.  Spoke with pt re: need for SNF, and she stated, "let's see how I do tomorrow"    OT Diagnosis: Generalized weakness;Acute pain  OT Problem List: Decreased strength;Decreased activity tolerance;Impaired balance (sitting and/or standing);Decreased knowledge of use of DME or AE;Impaired UE functional use;Pain OT Treatment Interventions: Self-care/ADL training;DME and/or AE instruction;Therapeutic activities;Patient/family education;Balance training   OT Goals Acute Rehab OT Goals OT Goal Formulation: With patient Time For Goal Achievement: 11/26/12 Potential to Achieve Goals: Good ADL Goals Pt Will Perform Grooming: with min assist;Standing at sink ADL Goal: Grooming - Progress: Goal set today Pt Will Perform Lower Body Bathing: with min assist;Sit to stand from chair;Sit to stand from bed ADL Goal: Lower Body Bathing - Progress: Goal set today Pt Will Perform Lower Body Dressing: with min assist;Sit to stand from chair;Sit to stand from bed ADL Goal: Lower Body Dressing - Progress: Goal set today Pt Will Transfer to Toilet: with min assist;Ambulation;3-in-1 ADL Goal: Toilet Transfer - Progress: Goal set today  Visit Information  Last OT Received On: 11/19/12 Assistance Needed: +2    Subjective Data  Subjective: "Oh, I just can't stand it" re: pain Patient Stated Goal: To get better   Prior Functioning     Home  Living Lives  With: Spouse Available Help at Discharge: Available 24 hours/day;Family Type of Home: House Home Access: Level entry Home Layout: One level Bathroom Shower/Tub: Engineer, manufacturing systems: Standard Bathroom Accessibility: Yes How Accessible: Accessible via walker Home Adaptive Equipment: Bedside commode/3-in-1;Walker - rolling;Other (comment) Additional Comments: Pt reports husband in poor health, but is physically strong and able to help her  Prior Function Level of Independence: Independent with assistive device(s) Able to Take Stairs?: Yes Vocation: Retired Comments: Pt reports frequent falls PTA Communication Communication: HOH Dominant Hand: Right         Vision/Perception     Cognition  Overall Cognitive Status: Appears within functional limits for tasks assessed/performed Arousal/Alertness: Awake/alert Orientation Level: Appears intact for tasks assessed Behavior During Session: [facility] for tasks performed    Extremity/Trunk Assessment Right Upper Extremity Assessment RUE ROM/Strength/Tone: Deficits RUE ROM/Strength/Tone Deficits: old injury.  Strength shoulder 2-/5 RUE Coordination: WFL - fine motor Left Upper Extremity Assessment LUE ROM/Strength/Tone: Deficits LUE ROM/Strength/Tone Deficits: shoulder strength grossly 3-/5-3/5 LUE Coordination: WFL - fine motor Right Lower Extremity Assessment RLE ROM/Strength/Tone: Deficits RLE ROM/Strength/Tone Deficits: unable to lift LE against gravity, maintained KI Left Lower Extremity Assessment LLE ROM/Strength/Tone: WFL for tasks assessed     Mobility Bed Mobility Bed Mobility: Supine to Sit Supine to Sit: 4: Min assist;HOB elevated Details for Bed Mobility Assistance: assist for R LE Transfers Transfers: Sit to Stand;Stand to Sit Sit to Stand: 3: Mod assist;From chair/3-in-1;With upper extremity assist Sit to Stand: Patient Percentage: 70% Stand to Sit: 3: Mod assist;With upper extremity assist;To  chair/3-in-1 Stand to Sit: Patient Percentage: 70% Details for Transfer Assistance: Pt unable to maintain standing - falls backwards due to pain.  Requires cues for proper hand  placement     Shoulder Instructions     Exercise     Balance Balance Balance Assessed: Yes Static Standing Balance Static Standing - Balance Support: Bilateral upper extremity supported Static Standing - Level of Assistance: 3: Mod assist;2: Max assist Static Standing - Comment/# of Minutes: 10 seconds   End of Session OT - End of Session Equipment Utilized During Treatment: Right knee immobilizer Activity Tolerance: Patient limited by pain Patient left: in chair;with call bell/phone within reach  GO     Conarpe, Wendi M 11/19/2012, 1:41 PM

## 2012-11-20 LAB — BASIC METABOLIC PANEL
BUN: 10 mg/dL (ref 6–23)
CO2: 29 mEq/L (ref 19–32)
Calcium: 9.4 mg/dL (ref 8.4–10.5)
Chloride: 98 mEq/L (ref 96–112)
Creatinine, Ser: 0.6 mg/dL (ref 0.50–1.10)
GFR calc Af Amer: >90 mL/min (ref >90)
GFR calc non Af Amer: 86 mL/min — ABNORMAL LOW (ref >90)
Glucose, Bld: 135 mg/dL — ABNORMAL HIGH (ref 70–99)
Potassium: 3.8 mEq/L (ref 3.5–5.1)
Sodium: 135 mEq/L (ref 135–145)

## 2012-11-20 LAB — CBC
HCT: 26.6 % — ABNORMAL LOW (ref 36.0–46.0)
Hemoglobin: 9.1 g/dL — ABNORMAL LOW (ref 12.0–15.0)
MCH: 27.7 pg (ref 26.0–34.0)
MCHC: 34.2 g/dL (ref 30.0–36.0)
MCV: 81.1 fL (ref 78.0–100.0)
Platelets: 196 10*3/uL (ref 150–400)
RBC: 3.28 MIL/uL — ABNORMAL LOW (ref 3.87–5.11)
RDW: 13.1 % (ref 11.5–15.5)
WBC: 12.5 10*3/uL — ABNORMAL HIGH (ref 4.0–10.5)

## 2012-11-20 MED ORDER — RIVAROXABAN 10 MG PO TABS: 10.0000 mg | ORAL_TABLET | Freq: Every day | ORAL | Status: DC

## 2012-11-20 MED ORDER — OXYCODONE HCL 5 MG PO TABS: 5.0000 mg | ORAL_TABLET | ORAL | Status: DC | PRN

## 2012-11-20 MED ORDER — METHOCARBAMOL 500 MG PO TABS: 500.0000 mg | ORAL_TABLET | Freq: Four times a day (QID) | ORAL | Status: DC | PRN

## 2012-11-20 NOTE — Progress Notes (Signed)
Physical Therapy Treatment Patient Details Name: Cynthia Guzman MRN: [id number] DOB: 09-24-1932 Today's Date: 11/20/2012 Time: 4098-1191 PT Time Calculation (min): 25 min  PT Assessment / Plan / Recommendation Comments on Treatment Session  Pt ambulated short distance and performed exercises.  RN brought pt pain meds during tx.    Follow Up Recommendations  Supervision/Assistance - 24 hour;SNF     Does the patient have the potential to tolerate intense rehabilitation     Barriers to Discharge        Equipment Recommendations  None recommended by OT;None recommended by PT    Recommendations for Other Services    Frequency     Plan Discharge plan remains appropriate;Frequency remains appropriate    Precautions / Restrictions Precautions Precautions: Fall;Knee Required Braces or Orthoses: Knee Immobilizer - Right Knee Immobilizer - Right: Discontinue once straight leg raise with < 10 degree lag Restrictions RLE Weight Bearing: Weight bearing as tolerated   Pertinent Vitals/Pain 10/10 R knee pain, RN notified and brought pain meds    Mobility  Bed Mobility Bed Mobility: Supine to Sit Sit to Supine: 4: Min assist;HOB flat Details for Bed Mobility Assistance: assist for R LE Transfers Transfers: Stand to Sit;Sit to Stand Sit to Stand: 4: Min assist;With upper extremity assist;From chair/3-in-1 Stand to Sit: 4: Min guard;With upper extremity assist;To bed;To elevated surface Details for Transfer Assistance: verbal cues for hand placement and R LE forward Ambulation/Gait Ambulation/Gait Assistance: 4: Min assist;4: Min guard Ambulation Distance (Feet): 40 Feet Assistive device: Rolling walker Ambulation/Gait Assistance Details: verbal cues for sequence, RW distance, step length. posture Gait Pattern: Step-to pattern;Antalgic    Exercises Total Joint Exercises Ankle Circles/Pumps: AROM;15 reps;Both Quad Sets: AROM;15 reps;Both Gluteal Sets: AROM;Both;15 reps Towel  Squeeze: AROM;15 reps;Both Short Arc Quad: AAROM;15 reps;Right Heel Slides: AAROM;Right;10 reps Hip ABduction/ADduction: AAROM;Right;15 reps   PT Diagnosis:    PT Problem List:   PT Treatment Interventions:     PT Goals Acute Rehab PT Goals PT Goal: Sit to Supine/Side - Progress: Progressing toward goal PT Goal: Sit to Stand - Progress: Progressing toward goal PT Goal: Stand to Sit - Progress: Progressing toward goal PT Goal: Ambulate - Progress: Progressing toward goal PT Goal: Perform Home Exercise Program - Progress: Progressing toward goal  Visit Information  Last PT Received On: 11/20/12 Assistance Needed: +1    Subjective Data  Subjective: "what?"  pt very HOH   Cognition  Overall Cognitive Status: Appears within functional limits for tasks assessed/performed    Balance     End of Session PT - End of Session Equipment Utilized During Treatment: Right knee immobilizer;Gait belt Activity Tolerance: Patient limited by pain Patient left: in bed;with call bell/phone within reach   GP     LEMYRE,KATHrine E 11/20/2012, 1:18 PM Pager: [phone]

## 2012-11-20 NOTE — Progress Notes (Signed)
Physical Therapy Treatment Note   11/20/12 1400  PT Visit Information  Last PT Received On 11/20/12  Assistance Needed +1  PT Time Calculation  PT Start Time 1401  PT Stop Time 1430  PT Time Calculation (min) 29 min  Subjective Data  Subjective Let me call out for more pain meds.  Precautions  Precautions Fall;Knee  Required Braces or Orthoses Knee Immobilizer - Right  Knee Immobilizer - Right Discontinue once straight leg raise with < 10 degree lag  Restrictions  RLE Weight Bearing WBAT  Cognition  Overall Cognitive Status Appears within functional limits for tasks assessed/performed  Bed Mobility  Bed Mobility Supine to Sit;Sit to Supine  Supine to Sit 5: Supervision  Sit to Supine 4: Min assist  Details for Bed Mobility Assistance able to assist R LE over EOB however unable to bring onto bed with increased time and pt hurting so assisted onto bed  Transfers  Transfers Stand to Sit;Sit to Stand;Stand Pivot Transfers  Sit to Stand 4: Min assist;With upper extremity assist;From chair/3-in-1;From bed  Stand to Sit 4: Min guard;With upper extremity assist;To bed;To elevated surface;To chair/3-in-1  Stand Pivot Transfers 4: Min guard  Details for Transfer Assistance verbal cues for safe technique  Ambulation/Gait  Ambulation/Gait Assistance 4: Min guard  Ambulation Distance (Feet) 60 Feet  Assistive device Rolling walker  Ambulation/Gait Assistance Details verbal cues for sequence and step length, also for posture however pt reports she hasn't been able to stand erect in a long time  Gait Pattern Step-to pattern;Antalgic  Gait velocity decreased  Total Joint Exercises  Ankle Circles/Pumps AROM;15 reps;Both  Quad Sets AROM;15 reps;Both  Short Arc Quad AROM;Right;15 reps  Heel Slides AAROM;Right;15 reps;Seated  Hip ABduction/ADduction AROM;Right;15 reps  PT - End of Session  Equipment Utilized During Treatment Right knee immobilizer  Activity Tolerance Patient tolerated  treatment well  Patient left in bed;with call bell/phone within reach;with family/visitor present  PT - Assessment/Plan  Comments on Treatment Session Pt ambulated again and performed exercises in chair then assisted back to bed.  Pt mobilizing better this afternoon but would benefit from more acute therapy.   PT Plan Discharge plan remains appropriate;Frequency remains appropriate  Follow Up Recommendations Supervision/Assistance - 24 hour;SNF  Equipment Recommended None recommended by OT;None recommended by PT  Acute Rehab PT Goals  PT Goal: Supine/Side to Sit - Progress Progressing toward goal  PT Goal: Sit to Supine/Side - Progress Progressing toward goal  PT Goal: Sit to Stand - Progress Progressing toward goal  PT Goal: Stand to Sit - Progress Progressing toward goal  PT Goal: Ambulate - Progress Progressing toward goal  PT Goal: Perform Home Exercise Program - Progress Progressing toward goal  PT General Charges  $$ ACUTE PT VISIT 1 Procedure  PT Treatments  $Gait Training 8-22 mins  $Therapeutic Exercise 8-22 mins  RN came in to medicate pt during therapy.  Zenovia Jarred, PT Pager: [phone]

## 2012-11-20 NOTE — Progress Notes (Signed)
Occupational Therapy Treatment Patient Details Name: Cynthia Guzman MRN: [id number] DOB: 01-12-1932 Today's Date: 11/20/2012 Time: 0833-0900 OT Time Calculation (min): 27 min  OT Assessment / Plan / Recommendation Comments on Treatment Session      Follow Up Recommendations  SNF;Supervision/Assistance - 24 hour (if home may need more support than just husband)    Barriers to Discharge       Equipment Recommendations  None recommended by OT;None recommended by PT    Recommendations for Other Services    Frequency Min 2X/week   Plan      Precautions / Restrictions Precautions Precautions: Fall;Knee Required Braces or Orthoses: Knee Immobilizer - Right Knee Immobilizer - Right: Discontinue once straight leg raise with < 10 degree lag Restrictions Weight Bearing Restrictions: No RLE Weight Bearing: Weight bearing as tolerated   Pertinent Vitals/Pain Pain in RLE with movement/weight bearing.  Premedicated, ice reapplied and repositioned.  Pt also has h/o back pain.  Positioned with pillow behind her for comfort    ADL  Grooming: Performed;Teeth care;Min guard Where Assessed - Grooming: Supported standing Toilet Transfer: Mining engineer Method: Sit to Barista:  (bed, sink, chair) Equipment Used: Knee Immobilizer;Rolling walker Transfers/Ambulation Related to ADLs: min A to stand from elevated surface.  Pt fatiques quickly.  States that husband uses a cane. WHEN WALKING, PT NEEDED MOD CUES TO STAY UPRIGHT.  ATTEMPTED TO LEAN ON R FOREARM ON WALKER SEVERAL TIMES.    ADL Comments: ADL was done by nursing this am.  She used bedpan just before I came.  Pt did better than during eval but she may need to consider snf or having additional help at home as husband uses cane and she fatiques quickly.  No LOB today    OT Diagnosis:    OT Problem List:   OT Treatment Interventions:     OT Goals ADL Goals Pt Will Perform Grooming:  with min assist;Standing at sink ADL Goal: Grooming - Progress: Met Pt Will Transfer to Toilet: with min assist;Ambulation;3-in-1 ADL Goal: Toilet Transfer - Progress: Progressing toward goals  Visit Information  Last OT Received On: 11/20/12 Assistance Needed: +1    Subjective Data      Prior Functioning       Cognition  Overall Cognitive Status: Impaired Area of Impairment: Safety/judgement Arousal/Alertness: Awake/alert Orientation Level: Appears intact for tasks assessed Behavior During Session: [facility] for tasks performed Cognition - Other Comments: mod cues for safety with rw    Mobility  Shoulder Instructions Bed Mobility Supine to Sit: 4: Min assist;HOB elevated Transfers Sit to Stand: From bed;From elevated surface;4: Min assist;With upper extremity assist Details for Transfer Assistance: cues for hand placement.        Exercises      Balance     End of Session OT - End of Session Activity Tolerance: Patient limited by fatigue Patient left: in chair;with call bell/phone within reach CPM Right Knee CPM Right Knee: Off  GO     Cynthia Guzman,Cynthia Guzman 11/20/2012, 9:27 AM Marica Otter, OTR/L [phone] 11/20/2012

## 2012-11-20 NOTE — Progress Notes (Signed)
   Subjective: 2 Days Post-Op Procedure(s) (LRB): TOTAL KNEE ARTHROPLASTY (Right) Patient reports pain as mild.   Patient seen in rounds for Dr. Lequita Halt. Patient is well, and has had no acute complaints or problems.  She is feeling better today and wants to try and go home later today if she is able. Patient is ready to go if does well with both sessions of therapy.  Objective: Vital signs in last 24 hours: Temp:  [97.5 F (36.4 C)-99.3 F (37.4 C)] 99.3 F (37.4 C) (11/27 0527) Pulse Rate:  [58-90] 68  (11/27 0527) Resp:  [12-16] 15  (11/27 0758) BP: (124-168)/(52-75) 153/72 mmHg (11/27 0527) SpO2:  [95 %-98 %] 96 % (11/27 0527)  Intake/Output from previous day:  Intake/Output Summary (Last 24 hours) at 11/20/12 0804 Last data filed at 11/20/12 0600  Gross per 24 hour  Intake   1280 ml  Output   3650 ml  Net  -2370 ml    Intake/Output this shift:    Labs:  Basename 11/20/12 0438 11/19/12 0417  HGB 9.1* 9.1*    Basename 11/20/12 0438 11/19/12 0417  WBC 12.5* 10.9*  RBC 3.28* 3.35*  HCT 26.6* 27.4*  PLT 196 190    Basename 11/20/12 0438 11/19/12 0417  NA 135 135  K 3.8 3.9  CL 98 101  CO2 29 28  BUN 10 11  CREATININE 0.60 0.75  GLUCOSE 135* 127*  CALCIUM 9.4 8.8   No results found for this basename: LABPT:2,INR:2 in the last 72 hours  EXAM: General - Patient is Alert, Appropriate and Oriented Extremity - Neurovascular intact Sensation intact distally Dorsiflexion/Plantar flexion intact No cellulitis present Incision - clean, dry, no drainage, healing Motor Function - intact, moving foot and toes well on exam.   Assessment/Plan: 2 Days Post-Op Procedure(s) (LRB): TOTAL KNEE ARTHROPLASTY (Right) Procedure(s) (LRB): TOTAL KNEE ARTHROPLASTY (Right) Past Medical History  Diagnosis Date  . Osteoporosis   . Depression   . Hypertension   . Chronic back pain   . eczema   . Memory impairment   . Hearing impairment   . Complication of anesthesia     hard to wake up  . Glaucoma    Principal Problem:  *OA (osteoarthritis) of knee Active Problems:  Postop Acute blood loss anemia  Estimated Body mass index is 32.12 kg/(m^2) as calculated from the following:   Height as of this encounter: 5\' 1" (1.549 m).   Weight as of this encounter: 170 lb(77.111 kg). Up with therapy Discharge home with home health Diet - Cardiac diet Follow up - in 2 weeks Activity - WBAT Disposition - Home Condition Upon Discharge - Pending at the time of this note.  Home is improved. D/C Meds - See DC Summary DVT Prophylaxis - Xarelto  PERKINS, ALEXZANDREW 11/20/2012, 8:04 AM

## 2012-11-20 NOTE — Care Management Note (Addendum)
    Page 1 of 2   11/21/2012     12:03:27 PM   CARE MANAGEMENT NOTE 11/21/2012  Patient:  Cynthia Guzman, Cynthia Guzman   Account Number:  [id number]  Date Initiated:  11/20/2012  Documentation initiated by:  Colleen Can  Subjective/Objective Assessment:   dx osteoarthritis right knee; total knee arthroplasty.   Referral to [facility] from doctor's office for [facility] services     Action/Plan:   Cm spoke with patient and spouse. Plans are for patient to return to her home in [place] where spouse will be caregiver. She already has DME.   Anticipated DC Date:  11/21/2012   Anticipated DC Plan:  HOME W HOME HEALTH SERVICES  In-house referral  Clinical Social Worker      DC Planning Services  CM consult      [facility] Choice  HOME HEALTH   Choice offered to / List presented to:  C-3 Spouse   DME arranged  NA      DME agency  NA     HH arranged  HH-2 PT      [facility] agency  [facility]   Status of service:  Completed, signed off Medicare Important Message given?  NA - LOS <3 / Initial given by admissions (If response is "NO", the following Medicare IM given date fields will be blank) Date Medicare IM given:   Date Additional Medicare IM given:    Discharge Disposition:  HOME W HOME HEALTH SERVICES  Per UR Regulation:  Reviewed for med. necessity/level of care/duration of stay  If discussed at Long Length of Stay Meetings, dates discussed:    Comments:  11/21/12 1200 Clinton Gallant, RN, CCM [phone] Spoke with pt to confirm d/c no need for DME (has home DME) HHPT referral completed on 11/20/12 by unit CM CM signing off Unit RN updated  11/20/2012 Colleen Can BSN RN CCM [phone] Genevieve Norlander will provide [facility] services with start date of day after discharge.

## 2012-11-21 LAB — CBC
HCT: 24.8 % — ABNORMAL LOW (ref 36.0–46.0)
Hemoglobin: 8.2 g/dL — ABNORMAL LOW (ref 12.0–15.0)
MCH: 27.5 pg (ref 26.0–34.0)
MCHC: 33.1 g/dL (ref 30.0–36.0)
MCV: 83.2 fL (ref 78.0–100.0)
Platelets: 180 10*3/uL (ref 150–400)
RBC: 2.98 MIL/uL — ABNORMAL LOW (ref 3.87–5.11)
RDW: 13.7 % (ref 11.5–15.5)
WBC: 11.3 10*3/uL — ABNORMAL HIGH (ref 4.0–10.5)

## 2012-11-21 NOTE — Progress Notes (Signed)
Physical Therapy Treatment Patient Details Name: Cynthia Guzman MRN: [id number] DOB: [birth year] Today's Date: 11/21/2012 Time: 4098-1191 PT Time Calculation (min): 28 min  PT Assessment / Plan / Recommendation Comments on Treatment Session  POD # 3 am session R TKR.  Assisted pt OOB to [facility] then amb in hallway.  MD came in to round, Pt will D/C today after 2 PT sessions.    Follow Up Recommendations  Supervision - Intermittent     Does the patient have the potential to tolerate intense rehabilitation     Barriers to Discharge        Equipment Recommendations  Other (comment) (has all equipment)    Recommendations for Other Services    Frequency 7X/week   Plan Discharge plan remains appropriate    Precautions / Restrictions Precautions Precautions: Knee Precaution Comments: Instructed pt on KI use for amb Required Braces or Orthoses: Knee Immobilizer - Right Restrictions Weight Bearing Restrictions: Yes RLE Weight Bearing: Weight bearing as tolerated Other Position/Activity Restrictions: pt aware of WBAT   Pertinent Vitals/Pain C/o 4/10 during gait    Mobility  Bed Mobility Bed Mobility: Supine to Sit Supine to Sit: 4: Min guard Details for Bed Mobility Assistance: Min Guard assist to guard R LE off bed and increased time Transfers Transfers: Sit to Stand;Stand to Sit Sit to Stand: 4: Min assist;From bed;From toilet Stand to Sit: 4: Min assist;To toilet;To chair/3-in-1 Details for Transfer Assistance: 25% VC's on proper tech and hand placement Ambulation/Gait Ambulation/Gait Assistance: 4: Min guard Ambulation Distance (Feet): 75 Feet Assistive device: Rolling walker Ambulation/Gait Assistance Details: 25% VC's on proper sequencing and proper walker to self distance.  Pt required increased time. Gait Pattern: Step-to pattern;Decreased stance time - right;Trunk flexed Gait velocity: decreased     PT Goals    Visit Information  Last PT Received On:  11/21/12    Subjective Data      Cognition       Balance     End of Session PT - End of Session Equipment Utilized During Treatment: Gait belt Activity Tolerance: Patient tolerated treatment well Patient left: in chair;with call bell/phone within reach   Felecia Shelling  PTA [facility]  Acute  Rehab Pager     [phone]

## 2012-11-21 NOTE — Progress Notes (Signed)
Orthopedics Progress Note  Subjective: Pt doing well Mild pain but slowly improving Pt wants to go home today  Objective:  Filed Vitals:   11/21/12 0428  BP: 135/76  Pulse: 65  Temp: 98.9 F (37.2 C)  Resp: 20    General: Awake and alert  Musculoskeletal: right knee incision healing well, nv intact distally No drainage or erythema Neurovascularly intact  Lab Results  Component Value Date   WBC 11.3* 11/21/2012   HGB 8.2* 11/21/2012   HCT 24.8* 11/21/2012   MCV 83.2 11/21/2012   PLT 180 11/21/2012       Component Value Date/Time   NA 135 11/20/2012 0438   K 3.8 11/20/2012 0438   CL 98 11/20/2012 0438   CO2 29 11/20/2012 0438   GLUCOSE 135* 11/20/2012 0438   BUN 10 11/20/2012 0438   CREATININE 0.60 11/20/2012 0438   CALCIUM 9.4 11/20/2012 0438   GFRNONAA 86* 11/20/2012 0438   GFRAA >90 11/20/2012 0438    Lab Results  Component Value Date   INR 0.96 11/12/2012   INR 1.0 08/09/2009    Assessment/Plan: POD #3 s/p Procedure(s):right TOTAL KNEE ARTHROPLASTY  PT/OT D/c home after therapy  Viviann Spare R. Ranell Patrick, MD 11/21/2012 8:01 AM

## 2012-11-21 NOTE — Progress Notes (Signed)
Occupational Therapy Treatment Patient Details Name: Cynthia Guzman MRN: [id number] DOB: [birth year] Today's Date: 11/21/2012 Time: 4098-1191 OT Time Calculation (min): 18 min  OT Assessment / Plan / Recommendation Comments on Treatment Session Pt seen for OT. Note plan is for discharge home today. Recommend 24/7 assist and HHOT. Pt states daugther and daughter in law can help also at home in addition to spouse. Pt overall at min assist level today.    Follow Up Recommendations  Pt desires to discharge home. OT had recommended SNF. If home, recommend strongly Home health OT;Supervision/Assistance - 24 hour and more assist as husband not in good health   Barriers to Discharge       Equipment Recommendations  None recommended by PT;None recommended by OT    Recommendations for Other Services    Frequency Min 2X/week   Plan Discharge plan needs to be updated    Precautions / Restrictions Precautions Precautions: Knee Precaution Comments: Instructed pt on KI use for amb Required Braces or Orthoses: Knee Immobilizer - Right Knee Immobilizer - Right: Discontinue once straight leg raise with < 10 degree lag Restrictions Weight Bearing Restrictions: Yes RLE Weight Bearing: Weight bearing as tolerated Other Position/Activity Restrictions: pt aware of WBAT        ADL  Toilet Transfer: Performed;Minimal assistance Toilet Transfer Equipment: Bedside commode;Other (comment) (from bed to [facility] at sink) Toileting - Clothing Manipulation and Hygiene: Simulated;Minimal assistance Where Assessed - Toileting Clothing Manipulation and Hygiene: Sit to stand from 3-in-1 or toilet Equipment Used: Rolling walker ADL Comments: Pt not as forward leaning over RW today and better activity tolerance. Pt states husband can assist at home but 'isnt in good health." Discussed recommendation of having more assist at home. Pt states daugthers and daugther in law can come and help. Advised pt that would be a  good idea and for them to help with ADL PRN to help husband out. Pt agreeable. Discussed how to don/doff KI and to wear when up till [facility] therapy advises she can have it off. Also recommended pt sponge bathe until stronger to get into tub. Pt needs min assist to steady with all functional transfers and to pull  up gown, etc. Pt aware of need to have physical assist.     OT Diagnosis:    OT Problem List:   OT Treatment Interventions:     OT Goals ADL Goals ADL Goal: Toilet Transfer - Progress: Progressing toward goals  Visit Information  Last OT Received On: 11/21/12 Assistance Needed: +1    Subjective Data  Subjective: my husband will be here when I call him Patient Stated Goal: none stated. agreeable up with OT   Prior Functioning       Cognition  Overall Cognitive Status: Appears within functional limits for tasks assessed/performed Arousal/Alertness: Awake/alert Behavior During Session: [facility] for tasks performed Cognition - Other Comments: pt very HOH    Mobility  Shoulder Instructions Bed Mobility Bed Mobility: Supine to Sit Supine to Sit: 4: Min assist;HOB elevated Details for Bed Mobility Assistance: min assist for R LE off bed due to pain. Transfers Transfers: Sit to Stand;Stand to Sit Sit to Stand: 4: Min assist;With upper extremity assist;From bed;From chair/3-in-1 Stand to Sit: 4: Min assist;With upper extremity assist;To chair/3-in-1 Details for Transfer Assistance: verbal cues for hand placement and R LE management.          Balance Balance Balance Assessed: Yes Dynamic Standing Balance Dynamic Standing - Level of Assistance: 4: Min assist Dynamic Standing -  Balance Activities: Other (comment) (to pull up gown)   End of Session OT - End of Session Activity Tolerance: Patient tolerated treatment well Patient left: with call bell/phone within reach  GO     Lennox Laity [phone] 11/21/2012, 11:46 AM

## 2012-11-21 NOTE — Progress Notes (Signed)
Physical Therapy Treatment Patient Details Name: Cynthia Guzman MRN: [id number] DOB: 1932/05/27 Today's Date: 11/21/2012 Time: 4098-1191 PT Time Calculation (min): 24 min  PT Assessment / Plan / Recommendation Comments on Treatment Session  POD # 3 secont PT session.  Amb to BR for a BM then assisted back to bed to perform TKR TE's.  Instructed on freq and use of ICE after. Pt plans to D/c to home with spouse today.    Follow Up Recommendations  Supervision - Intermittent     Does the patient have the potential to tolerate intense rehabilitation     Barriers to Discharge        Equipment Recommendations  Other (comment) (has all equipment)    Recommendations for Other Services    Frequency 7X/week   Plan Discharge plan remains appropriate    Precautions / Restrictions Precautions Precautions: Knee Precaution Comments: Instructed pt on KI use for amb Required Braces or Orthoses: Knee Immobilizer - Right Restrictions Weight Bearing Restrictions: Yes RLE Weight Bearing: Weight bearing as tolerated Other Position/Activity Restrictions: pt aware of WBAT    Pertinent Vitals/Pain C/o 8/10 during TE's ICE applied    Mobility  Bed Mobility Bed Mobility: Supine to Sit Supine to Sit: 4: Min guard Details for Bed Mobility Assistance: Min Guard assist to guard R LE off bed and increased time Transfers Transfers: Sit to Stand;Stand to Sit Sit to Stand: 4: Min assist;From bed;From toilet Stand to Sit: 4: Min assist;To toilet;To chair/3-in-1 Details for Transfer Assistance: 25% VC's on proper tech and hand placement Ambulation/Gait Ambulation/Gait Assistance: 4: Min guard Ambulation Distance (Feet): 75 Feet Assistive device: Rolling walker Ambulation/Gait Assistance Details: 25% VC's on proper sequencing and proper walker to self distance.  Pt required increased time. Gait Pattern: Step-to pattern;Decreased stance time - right;Trunk flexed Gait velocity: decreased      Exercises Total Joint Exercises Ankle Circles/Pumps: AROM;Both;10 reps;Supine Quad Sets: AROM;Both;10 reps;Supine Gluteal Sets: AROM;Both;10 reps;Supine Towel Squeeze: AROM;Both;10 reps;Supine Short Arc Quad: AROM;Right;10 reps;Supine Heel Slides: AAROM;Right;10 reps;Supine Hip ABduction/ADduction: AAROM;Right;10 reps;Supine Straight Leg Raises: AAROM;Right;10 reps;Supine   PT Goals                                                       progressing    Visit Information  Last PT Received On: 11/21/12    Subjective Data      Cognition       Balance     End of Session PT - End of Session Equipment Utilized During Treatment: Gait belt Activity Tolerance: Patient limited by pain Patient left: in bed;with call bell/phone within reach (ICE to R knee)   Felecia Shelling  PTA [facility]  Acute  Rehab Pager     [phone]

## 2012-11-22 NOTE — Discharge Summary (Signed)
Physician Discharge Summary  Patient ID: Cynthia Guzman MRN: [id number] DOB/AGE: 76/13/1933 76 y.o.  Admit date: 11/18/2012 Discharge date: 11/21/2012  Admission Diagnoses:  Principal Problem:  *OA (osteoarthritis) of knee Active Problems:  Postop Acute blood loss anemia   Discharge Diagnoses:  Same   Surgeries: Procedure(s): TOTAL KNEE ARTHROPLASTY on 11/18/2012   Consultants: PT/OT  Discharged Condition: Stable  Hospital Course: Cynthia Guzman is an 78 y.o. female who was admitted 11/18/2012 with a chief complaint of No chief complaint on file. , and found to have a diagnosis of OA (osteoarthritis) of knee.  They were brought to the operating room on 11/18/2012 and underwent the above named procedures.    The patient had an uncomplicated hospital course and was stable for discharge.  Recent vital signs:  Filed Vitals:   11/21/12 1200  BP:   Pulse:   Temp:   Resp: 16    Recent laboratory studies:  Results for orders placed during the hospital encounter of 11/18/12  TYPE AND SCREEN      Component Value Range   ABO/RH(D) O POS     Antibody Screen NEG     Sample Expiration 11/21/2012    ABO/RH      Component Value Range   ABO/RH(D) O POS    CBC      Component Value Range   WBC 10.9 (*) 4.0 - 10.5 K/uL   RBC 3.35 (*) 3.87 - 5.11 MIL/uL   Hemoglobin 9.1 (*) 12.0 - 15.0 g/dL   HCT 84.6 (*) 96.2 - 95.2 %   MCV 81.8  78.0 - 100.0 fL   MCH 27.2  26.0 - 34.0 pg   MCHC 33.2  30.0 - 36.0 g/dL   RDW 84.1  32.4 - 40.1 %   Platelets 190  150 - 400 K/uL  BASIC METABOLIC PANEL      Component Value Range   Sodium 135  135 - 145 mEq/L   Potassium 3.9  3.5 - 5.1 mEq/L   Chloride 101  96 - 112 mEq/L   CO2 28  19 - 32 mEq/L   Glucose, Bld 127 (*) 70 - 99 mg/dL   BUN 11  6 - 23 mg/dL   Creatinine, Ser 0.27  0.50 - 1.10 mg/dL   Calcium 8.8  8.4 - 25.3 mg/dL   GFR calc non Af Amer 80 (*) >90 mL/min   GFR calc Af Amer >90  >90 mL/min  CBC      Component Value  Range   WBC 12.5 (*) 4.0 - 10.5 K/uL   RBC 3.28 (*) 3.87 - 5.11 MIL/uL   Hemoglobin 9.1 (*) 12.0 - 15.0 g/dL   HCT 66.4 (*) 40.3 - 47.4 %   MCV 81.1  78.0 - 100.0 fL   MCH 27.7  26.0 - 34.0 pg   MCHC 34.2  30.0 - 36.0 g/dL   RDW 25.9  56.3 - 87.5 %   Platelets 196  150 - 400 K/uL  BASIC METABOLIC PANEL      Component Value Range   Sodium 135  135 - 145 mEq/L   Potassium 3.8  3.5 - 5.1 mEq/L   Chloride 98  96 - 112 mEq/L   CO2 29  19 - 32 mEq/L   Glucose, Bld 135 (*) 70 - 99 mg/dL   BUN 10  6 - 23 mg/dL   Creatinine, Ser 6.43  0.50 - 1.10 mg/dL   Calcium 9.4  8.4 - 32.9 mg/dL  GFR calc non Af Amer 86 (*) >90 mL/min   GFR calc Af Amer >90  >90 mL/min  CBC      Component Value Range   WBC 11.3 (*) 4.0 - 10.5 K/uL   RBC 2.98 (*) 3.87 - 5.11 MIL/uL   Hemoglobin 8.2 (*) 12.0 - 15.0 g/dL   HCT 16.1 (*) 09.6 - 04.5 %   MCV 83.2  78.0 - 100.0 fL   MCH 27.5  26.0 - 34.0 pg   MCHC 33.1  30.0 - 36.0 g/dL   RDW 40.9  81.1 - 91.4 %   Platelets 180  150 - 400 K/uL    Discharge Medications:     Medication List     As of 11/22/2012  9:37 PM    STOP taking these medications         aspirin EC 81 MG tablet      diphenhydrAMINE 25 mg capsule   Commonly known as: BENADRYL      Vitamin D 2000 UNITS tablet      TAKE these medications         amLODipine 5 MG tablet   Commonly known as: NORVASC   Take 5 mg by mouth at bedtime.      latanoprost 0.005 % ophthalmic solution   Commonly known as: XALATAN   Place 1 drop into both eyes at bedtime.      methocarbamol 500 MG tablet   Commonly known as: ROBAXIN   Take 1 tablet (500 mg total) by mouth every 6 (six) hours as needed.      metoprolol 50 MG tablet   Commonly known as: LOPRESSOR   Take 50 mg by mouth at bedtime.      morphine 15 MG tablet   Commonly known as: MSIR   Take 15 mg by mouth every 4 (four) hours as needed. Pain      oxyCODONE 5 MG immediate release tablet   Commonly known as: Oxy IR/ROXICODONE   Take 1-2  tablets (5-10 mg total) by mouth every 3 (three) hours as needed.      rivaroxaban 10 MG Tabs tablet   Commonly known as: XARELTO   Take 1 tablet (10 mg total) by mouth daily with breakfast. Take Xarelto for two and a half more weeks, then discontinue Xarelto.  Once the patient has completed the Xarelto, they may resume the 81 mg Aspirin.      sertraline 100 MG tablet   Commonly known as: ZOLOFT   Take 100 mg by mouth at bedtime.      simvastatin 20 MG tablet   Commonly known as: ZOCOR   Take 20 mg by mouth at bedtime.        Diagnostic Studies: Dg Chest 2 View  11/12/2012  *RADIOLOGY REPORT*  Clinical Data: Hypertension.  CHEST - 2 VIEW  Comparison: December 21, 2009.  Findings: Cardiomediastinal silhouette appears normal.  No acute pulmonary disease is noted.  Bony thorax is intact.  IMPRESSION: No acute cardiopulmonary abnormality seen.   Original Report Authenticated By: Lupita Raider.,  M.D.     Disposition: 06-Home-Health Care Svc      Discharge Orders    Future Orders Please Complete By Expires   Diet - low sodium heart healthy      Diet - low sodium heart healthy      Call MD / Call 911      Comments:   If you experience chest pain or shortness of breath, CALL 911  and be transported to the hospital emergency room.  If you develope a fever above 101 F, pus (white drainage) or increased drainage or redness at the wound, or calf pain, call your surgeon's office.   Discharge instructions      Comments:   Pick up stool softner and laxative for home. Do not submerge incision under water. May shower. Continue to use ice for pain and swelling from surgery.  Take Xarelto for two and a half more weeks, then discontinue Xarelto. Once the patient has completed the Xarelto, they may resume the 81 mg Aspirin.   Constipation Prevention      Comments:   Drink plenty of fluids.  Prune juice may be helpful.  You may use a stool softener, such as Colace (over the counter) 100 mg  twice a day.  Use MiraLax (over the counter) for constipation as needed.   Increase activity slowly as tolerated      Patient may shower      Comments:   You may shower without a dressing once there is no drainage.  Do not wash over the wound.  If drainage remains, do not shower until drainage stops.   Weight bearing as tolerated      Driving restrictions      Comments:   No driving until released by the physician.   Lifting restrictions      Comments:   No lifting until released by the physician.   TED hose      Comments:   Use stockings (TED hose) for 3 weeks on both leg(s).  You may remove them at night for sleeping.   Change dressing      Comments:   Change dressing daily with sterile 4 x 4 inch gauze dressing and apply TED hose. Do not submerge the incision under water.   Do not put a pillow under the knee. Place it under the heel.      Do not sit on low chairs, stoools or toilet seats, as it may be difficult to get up from low surfaces      Call MD / Call 911      Comments:   If you experience chest pain or shortness of breath, CALL 911 and be transported to the hospital emergency room.  If you develope a fever above 101 F, pus (white drainage) or increased drainage or redness at the wound, or calf pain, call your surgeon's office.   Constipation Prevention      Comments:   Drink plenty of fluids.  Prune juice may be helpful.  You may use a stool softener, such as Colace (over the counter) 100 mg twice a day.  Use MiraLax (over the counter) for constipation as needed.   Increase activity slowly as tolerated         Follow-up Information    Follow up with Loanne Drilling, MD. Schedule an appointment as soon as possible for a visit in 2 weeks.   Contact information:   [address] [place] [address] [phone]           Signed: Thea Gist 11/22/2012, 9:37 PM

## 2012-12-19 ENCOUNTER — Ambulatory Visit: Payer: Medicare Other | Attending: Orthopedic Surgery

## 2012-12-19 DIAGNOSIS — R262 Difficulty in walking, not elsewhere classified: Secondary | ICD-10-CM | POA: Insufficient documentation

## 2012-12-19 DIAGNOSIS — M25569 Pain in unspecified knee: Secondary | ICD-10-CM | POA: Insufficient documentation

## 2012-12-19 DIAGNOSIS — Z96659 Presence of unspecified artificial knee joint: Secondary | ICD-10-CM | POA: Insufficient documentation

## 2012-12-19 DIAGNOSIS — IMO0001 Reserved for inherently not codable concepts without codable children: Secondary | ICD-10-CM | POA: Insufficient documentation

## 2012-12-19 DIAGNOSIS — M25669 Stiffness of unspecified knee, not elsewhere classified: Secondary | ICD-10-CM | POA: Insufficient documentation

## 2012-12-30 ENCOUNTER — Ambulatory Visit: Payer: Medicare Other | Attending: Orthopedic Surgery

## 2012-12-30 DIAGNOSIS — M25569 Pain in unspecified knee: Secondary | ICD-10-CM | POA: Insufficient documentation

## 2012-12-30 DIAGNOSIS — Z96659 Presence of unspecified artificial knee joint: Secondary | ICD-10-CM | POA: Insufficient documentation

## 2012-12-30 DIAGNOSIS — M25669 Stiffness of unspecified knee, not elsewhere classified: Secondary | ICD-10-CM | POA: Insufficient documentation

## 2012-12-30 DIAGNOSIS — R262 Difficulty in walking, not elsewhere classified: Secondary | ICD-10-CM | POA: Insufficient documentation

## 2012-12-30 DIAGNOSIS — IMO0001 Reserved for inherently not codable concepts without codable children: Secondary | ICD-10-CM | POA: Insufficient documentation

## 2013-01-02 ENCOUNTER — Ambulatory Visit: Payer: Medicare Other

## 2013-01-08 ENCOUNTER — Ambulatory Visit: Payer: Medicare Other | Admitting: Rehabilitation

## 2013-01-09 ENCOUNTER — Ambulatory Visit: Payer: Medicare Other

## 2013-01-14 ENCOUNTER — Ambulatory Visit: Payer: Medicare Other

## 2013-01-16 ENCOUNTER — Ambulatory Visit: Payer: Medicare Other | Admitting: Physical Therapy

## 2013-01-21 ENCOUNTER — Ambulatory Visit: Payer: Medicare Other

## 2013-01-23 ENCOUNTER — Ambulatory Visit: Payer: Medicare Other

## 2013-01-27 ENCOUNTER — Encounter: Payer: Medicare Other | Admitting: Physical Therapy

## 2013-03-20 ENCOUNTER — Other Ambulatory Visit: Payer: Self-pay | Admitting: Neurosurgery

## 2013-03-20 DIAGNOSIS — M47816 Spondylosis without myelopathy or radiculopathy, lumbar region: Secondary | ICD-10-CM

## 2013-03-24 ENCOUNTER — Ambulatory Visit
Admission: RE | Admit: 2013-03-24 | Discharge: 2013-03-24 | Disposition: A | Discharge status: Home / Self Care | Payer: Medicare Other | Source: Ambulatory Visit | Attending: Neurosurgery | Admitting: Neurosurgery

## 2013-03-24 DIAGNOSIS — M47816 Spondylosis without myelopathy or radiculopathy, lumbar region: Secondary | ICD-10-CM

## 2013-03-24 MED ORDER — METHYLPREDNISOLONE ACETATE 40 MG/ML INJ SUSP (RADIOLOG
120.0000 mg | Freq: Once | INTRAMUSCULAR | Status: AC
  Administered 2013-03-24: 120 mg via EPIDURAL

## 2013-03-24 MED ORDER — IOHEXOL 180 MG/ML  SOLN
1.0000 mL | Freq: Once | INTRAMUSCULAR | Status: AC | PRN
  Administered 2013-03-24: 1 mL via EPIDURAL

## 2013-05-29 ENCOUNTER — Other Ambulatory Visit: Payer: Self-pay | Admitting: Neurosurgery

## 2013-05-29 DIAGNOSIS — M47816 Spondylosis without myelopathy or radiculopathy, lumbar region: Secondary | ICD-10-CM

## 2013-06-10 ENCOUNTER — Ambulatory Visit
Admission: RE | Admit: 2013-06-10 | Discharge: 2013-06-10 | Disposition: A | Discharge status: Home / Self Care | Payer: Medicare Other | Source: Ambulatory Visit | Attending: Neurosurgery | Admitting: Neurosurgery

## 2013-06-10 DIAGNOSIS — M47816 Spondylosis without myelopathy or radiculopathy, lumbar region: Secondary | ICD-10-CM

## 2013-06-10 MED ORDER — METHYLPREDNISOLONE ACETATE 40 MG/ML INJ SUSP (RADIOLOG
120.0000 mg | Freq: Once | INTRAMUSCULAR | Status: AC
  Administered 2013-06-10: 120 mg via EPIDURAL

## 2013-06-10 MED ORDER — IOHEXOL 180 MG/ML  SOLN
1.0000 mL | Freq: Once | INTRAMUSCULAR | Status: AC | PRN
  Administered 2013-06-10: 1 mL via EPIDURAL

## 2013-10-31 ENCOUNTER — Other Ambulatory Visit: Payer: Self-pay

## 2013-10-31 DIAGNOSIS — Z1231 Encounter for screening mammogram for malignant neoplasm of breast: Secondary | ICD-10-CM

## 2013-12-02 ENCOUNTER — Ambulatory Visit
Admission: RE | Admit: 2013-12-02 | Discharge: 2013-12-02 | Disposition: A | Discharge status: Home / Self Care | Payer: Medicare Other | Source: Ambulatory Visit

## 2013-12-02 DIAGNOSIS — Z1231 Encounter for screening mammogram for malignant neoplasm of breast: Secondary | ICD-10-CM

## 2013-12-15 ENCOUNTER — Ambulatory Visit (HOSPITAL_COMMUNITY)
Admission: RE | Admit: 2013-12-15 | Discharge: 2013-12-15 | Disposition: A | Discharge status: Home / Self Care | Payer: Medicare Other | Source: Ambulatory Visit | Attending: Internal Medicine | Admitting: Internal Medicine

## 2013-12-15 ENCOUNTER — Other Ambulatory Visit (HOSPITAL_COMMUNITY): Payer: Self-pay | Admitting: Internal Medicine

## 2013-12-15 DIAGNOSIS — M810 Age-related osteoporosis without current pathological fracture: Secondary | ICD-10-CM | POA: Insufficient documentation

## 2013-12-15 MED ORDER — ZOLEDRONIC ACID 5 MG/100ML IV SOLN
5.0000 mg | Freq: Once | INTRAVENOUS | Status: AC
  Administered 2013-12-15: 5 mg via INTRAVENOUS
  Filled 2013-12-15: qty 100

## 2013-12-15 MED ORDER — SODIUM CHLORIDE 0.9 % IV SOLN
INTRAVENOUS | Status: DC
  Administered 2013-12-15: 12:00:00 via INTRAVENOUS

## 2014-10-23 ENCOUNTER — Other Ambulatory Visit: Payer: Self-pay

## 2014-10-23 DIAGNOSIS — Z1231 Encounter for screening mammogram for malignant neoplasm of breast: Secondary | ICD-10-CM

## 2014-11-10 ENCOUNTER — Ambulatory Visit
Admission: RE | Admit: 2014-11-10 | Discharge: 2014-11-10 | Disposition: A | Discharge status: Home / Self Care | Payer: Medicare Other | Source: Ambulatory Visit

## 2014-11-10 DIAGNOSIS — Z1231 Encounter for screening mammogram for malignant neoplasm of breast: Secondary | ICD-10-CM

## 2014-12-28 ENCOUNTER — Other Ambulatory Visit (HOSPITAL_COMMUNITY): Payer: Self-pay | Admitting: Internal Medicine

## 2015-01-01 ENCOUNTER — Ambulatory Visit (HOSPITAL_COMMUNITY)
Admission: RE | Admit: 2015-01-01 | Discharge: 2015-01-01 | Disposition: A | Discharge status: Home / Self Care | Payer: Commercial Managed Care - HMO | Source: Ambulatory Visit | Attending: Internal Medicine | Admitting: Internal Medicine

## 2015-01-01 ENCOUNTER — Encounter (HOSPITAL_COMMUNITY): Payer: Self-pay

## 2015-01-01 DIAGNOSIS — M810 Age-related osteoporosis without current pathological fracture: Secondary | ICD-10-CM | POA: Diagnosis not present

## 2015-01-01 MED ORDER — ZOLEDRONIC ACID 5 MG/100ML IV SOLN
5.0000 mg | Freq: Once | INTRAVENOUS | Status: AC
  Administered 2015-01-01: 5 mg via INTRAVENOUS
  Filled 2015-01-01: qty 100

## 2015-01-01 MED ORDER — SODIUM CHLORIDE 0.9 % IV SOLN
INTRAVENOUS | Status: DC
  Administered 2015-01-01: 250 mL via INTRAVENOUS

## 2015-01-01 NOTE — Discharge Instructions (Signed)
Drink fluids/water as tolerated over next 72hrs °Tylenol or Ibuprofen OTC as directed °Continue calcium and Vit D as directed by your MDZoledronic Acid injection (Paget's Disease, Osteoporosis) °What is this medicine? °ZOLEDRONIC ACID (ZOE le dron ik AS id) lowers the amount of calcium loss from bone. It is used to treat Paget's disease and osteoporosis in women. °This medicine may be used for other purposes; ask your health care provider or pharmacist if you have questions. °COMMON BRAND NAME(S): Reclast, Zometa °What should I tell my health care provider before I take this medicine? °They need to know if you have any of these conditions: °-aspirin-sensitive asthma °-cancer, especially if you are receiving medicines used to treat cancer °-dental disease or wear dentures °-infection °-kidney disease °-low levels of calcium in the blood °-past surgery on the parathyroid gland or intestines °-receiving corticosteroids like dexamethasone or prednisone °-an unusual or allergic reaction to zoledronic acid, other medicines, foods, dyes, or preservatives °-pregnant or trying to get pregnant °-breast-feeding °How should I use this medicine? °This medicine is for infusion into a vein. It is given by a health care professional in a hospital or clinic setting. °Talk to your pediatrician regarding the use of this medicine in children. This medicine is not approved for use in children. °Overdosage: If you think you have taken too much of this medicine contact a poison control center or emergency room at once. °NOTE: This medicine is only for you. Do not share this medicine with others. °What if I miss a dose? °It is important not to miss your dose. Call your doctor or health care professional if you are unable to keep an appointment. °What may interact with this medicine? °-certain antibiotics given by injection °-NSAIDs, medicines for pain and inflammation, like ibuprofen or naproxen °-some diuretics like bumetanide,  furosemide °-teriparatide °This list may not describe all possible interactions. Give your health care provider a list of all the medicines, herbs, non-prescription drugs, or dietary supplements you use. Also tell them if you smoke, drink alcohol, or use illegal drugs. Some items may interact with your medicine. °What should I watch for while using this medicine? °Visit your doctor or health care professional for regular checkups. It may be some time before you see the benefit from this medicine. Do not stop taking your medicine unless your doctor tells you to. Your doctor may order blood tests or other tests to see how you are doing. °Women should inform their doctor if they wish to become pregnant or think they might be pregnant. There is a potential for serious side effects to an unborn child. Talk to your health care professional or pharmacist for more information. °You should make sure that you get enough calcium and vitamin D while you are taking this medicine. Discuss the foods you eat and the vitamins you take with your health care professional. °Some people who take this medicine have severe bone, joint, and/or muscle pain. This medicine may also increase your risk for jaw problems or a broken thigh bone. Tell your doctor right away if you have severe pain in your jaw, bones, joints, or muscles. Tell your doctor if you have any pain that does not go away or that gets worse. °Tell your dentist and dental surgeon that you are taking this medicine. You should not have major dental surgery while on this medicine. See your dentist to have a dental exam and fix any dental problems before starting this medicine. Take good care of your teeth while on   this medicine. Make sure you see your dentist for regular follow-up appointments. °What side effects may I notice from receiving this medicine? °Side effects that you should report to your doctor or health care professional as soon as possible: °-allergic reactions  like skin rash, itching or hives, swelling of the face, lips, or tongue °-anxiety, confusion, or depression °-breathing problems °-changes in vision °-eye pain °-feeling faint or lightheaded, falls °-jaw pain, especially after dental work °-mouth sores °-muscle cramps, stiffness, or weakness °-trouble passing urine or change in the amount of urine °Side effects that usually do not require medical attention (report to your doctor or health care professional if they continue or are bothersome): °-bone, joint, or muscle pain °-constipation °-diarrhea °-fever °-hair loss °-irritation at site where injected °-loss of appetite °-nausea, vomiting °-stomach upset °-trouble sleeping °-trouble swallowing °-weak or tired °This list may not describe all possible side effects. Call your doctor for medical advice about side effects. You may report side effects to FDA at 1-800-FDA-1088. °Where should I keep my medicine? °This drug is given in a hospital or clinic and will not be stored at home. °NOTE: This sheet is a summary. It may not cover all possible information. If you have questions about this medicine, talk to your doctor, pharmacist, or health care provider. °© 2015, [employer]. (2013-05-26 10:03:48) ° °

## 2015-01-01 NOTE — Progress Notes (Signed)
Vital signs called to dr shaw (coveriClelia Croftng for tisovec,per amanda) re low diastolic and slightly elevated temperature . Question Continue with infusion. Per dr Clelia Croftshaw, OK to continue with reclast

## 2015-02-04 DIAGNOSIS — M47816 Spondylosis without myelopathy or radiculopathy, lumbar region: Secondary | ICD-10-CM | POA: Diagnosis not present

## 2015-02-09 DIAGNOSIS — H40053 Ocular hypertension, bilateral: Secondary | ICD-10-CM | POA: Diagnosis not present

## 2015-05-18 DIAGNOSIS — M47816 Spondylosis without myelopathy or radiculopathy, lumbar region: Secondary | ICD-10-CM | POA: Diagnosis not present

## 2015-06-18 DIAGNOSIS — H40053 Ocular hypertension, bilateral: Secondary | ICD-10-CM | POA: Diagnosis not present

## 2015-08-18 DIAGNOSIS — M47816 Spondylosis without myelopathy or radiculopathy, lumbar region: Secondary | ICD-10-CM | POA: Diagnosis not present

## 2015-08-18 DIAGNOSIS — S3600XA Unspecified injury of spleen, initial encounter: Secondary | ICD-10-CM | POA: Diagnosis not present

## 2015-10-11 ENCOUNTER — Other Ambulatory Visit: Payer: Self-pay

## 2015-10-11 DIAGNOSIS — Z1231 Encounter for screening mammogram for malignant neoplasm of breast: Secondary | ICD-10-CM

## 2015-10-13 DIAGNOSIS — H04123 Dry eye syndrome of bilateral lacrimal glands: Secondary | ICD-10-CM | POA: Diagnosis not present

## 2015-10-13 DIAGNOSIS — Z961 Presence of intraocular lens: Secondary | ICD-10-CM | POA: Diagnosis not present

## 2015-10-13 DIAGNOSIS — H16143 Punctate keratitis, bilateral: Secondary | ICD-10-CM | POA: Diagnosis not present

## 2015-10-13 DIAGNOSIS — H1859 Other hereditary corneal dystrophies: Secondary | ICD-10-CM | POA: Diagnosis not present

## 2015-10-13 DIAGNOSIS — H40053 Ocular hypertension, bilateral: Secondary | ICD-10-CM | POA: Diagnosis not present

## 2015-11-16 ENCOUNTER — Ambulatory Visit
Admission: RE | Admit: 2015-11-16 | Discharge: 2015-11-16 | Disposition: A | Discharge status: Home / Self Care | Payer: Commercial Managed Care - HMO | Source: Ambulatory Visit

## 2015-11-16 DIAGNOSIS — Z1231 Encounter for screening mammogram for malignant neoplasm of breast: Secondary | ICD-10-CM

## 2015-11-25 DIAGNOSIS — I10 Essential (primary) hypertension: Secondary | ICD-10-CM | POA: Diagnosis not present

## 2015-11-25 DIAGNOSIS — Z6829 Body mass index (BMI) 29.0-29.9, adult: Secondary | ICD-10-CM | POA: Diagnosis not present

## 2015-11-25 DIAGNOSIS — M810 Age-related osteoporosis without current pathological fracture: Secondary | ICD-10-CM | POA: Diagnosis not present

## 2015-11-25 DIAGNOSIS — M797 Fibromyalgia: Secondary | ICD-10-CM | POA: Diagnosis not present

## 2015-11-25 DIAGNOSIS — E784 Other hyperlipidemia: Secondary | ICD-10-CM | POA: Diagnosis not present

## 2015-11-25 DIAGNOSIS — E559 Vitamin D deficiency, unspecified: Secondary | ICD-10-CM | POA: Diagnosis not present

## 2015-11-25 DIAGNOSIS — I739 Peripheral vascular disease, unspecified: Secondary | ICD-10-CM | POA: Diagnosis not present

## 2015-11-25 DIAGNOSIS — G309 Alzheimer's disease, unspecified: Secondary | ICD-10-CM | POA: Diagnosis not present

## 2016-01-25 DIAGNOSIS — S3600XA Unspecified injury of spleen, initial encounter: Secondary | ICD-10-CM | POA: Diagnosis not present

## 2016-01-25 DIAGNOSIS — M47816 Spondylosis without myelopathy or radiculopathy, lumbar region: Secondary | ICD-10-CM | POA: Diagnosis not present

## 2016-01-25 DIAGNOSIS — M259 Joint disorder, unspecified: Secondary | ICD-10-CM | POA: Diagnosis not present

## 2016-02-16 DIAGNOSIS — H40053 Ocular hypertension, bilateral: Secondary | ICD-10-CM | POA: Diagnosis not present

## 2016-02-16 DIAGNOSIS — Z961 Presence of intraocular lens: Secondary | ICD-10-CM | POA: Diagnosis not present

## 2016-02-16 DIAGNOSIS — H1859 Other hereditary corneal dystrophies: Secondary | ICD-10-CM | POA: Diagnosis not present

## 2016-02-16 DIAGNOSIS — H10413 Chronic giant papillary conjunctivitis, bilateral: Secondary | ICD-10-CM | POA: Diagnosis not present

## 2016-02-17 DIAGNOSIS — M1712 Unilateral primary osteoarthritis, left knee: Secondary | ICD-10-CM | POA: Diagnosis not present

## 2016-02-17 DIAGNOSIS — Z96651 Presence of right artificial knee joint: Secondary | ICD-10-CM | POA: Diagnosis not present

## 2016-03-10 DIAGNOSIS — Z961 Presence of intraocular lens: Secondary | ICD-10-CM | POA: Diagnosis not present

## 2016-03-10 DIAGNOSIS — H40053 Ocular hypertension, bilateral: Secondary | ICD-10-CM | POA: Diagnosis not present

## 2016-03-10 DIAGNOSIS — H1859 Other hereditary corneal dystrophies: Secondary | ICD-10-CM | POA: Diagnosis not present

## 2016-03-10 DIAGNOSIS — H10413 Chronic giant papillary conjunctivitis, bilateral: Secondary | ICD-10-CM | POA: Diagnosis not present

## 2016-03-22 DIAGNOSIS — M1712 Unilateral primary osteoarthritis, left knee: Secondary | ICD-10-CM | POA: Diagnosis not present

## 2016-03-29 DIAGNOSIS — M1712 Unilateral primary osteoarthritis, left knee: Secondary | ICD-10-CM | POA: Diagnosis not present

## 2016-04-05 DIAGNOSIS — M1712 Unilateral primary osteoarthritis, left knee: Secondary | ICD-10-CM | POA: Diagnosis not present

## 2016-04-18 DIAGNOSIS — H40053 Ocular hypertension, bilateral: Secondary | ICD-10-CM | POA: Diagnosis not present

## 2016-04-18 DIAGNOSIS — Z961 Presence of intraocular lens: Secondary | ICD-10-CM | POA: Diagnosis not present

## 2016-04-18 DIAGNOSIS — H1859 Other hereditary corneal dystrophies: Secondary | ICD-10-CM | POA: Diagnosis not present

## 2016-05-16 DIAGNOSIS — M259 Joint disorder, unspecified: Secondary | ICD-10-CM | POA: Diagnosis not present

## 2016-05-16 DIAGNOSIS — M47816 Spondylosis without myelopathy or radiculopathy, lumbar region: Secondary | ICD-10-CM | POA: Diagnosis not present

## 2016-05-18 DIAGNOSIS — M1712 Unilateral primary osteoarthritis, left knee: Secondary | ICD-10-CM | POA: Diagnosis not present

## 2016-05-29 DIAGNOSIS — Z96651 Presence of right artificial knee joint: Secondary | ICD-10-CM | POA: Diagnosis not present

## 2016-05-29 DIAGNOSIS — F558 Abuse of other non-psychoactive substances: Secondary | ICD-10-CM | POA: Diagnosis not present

## 2016-05-29 DIAGNOSIS — H9193 Unspecified hearing loss, bilateral: Secondary | ICD-10-CM | POA: Diagnosis not present

## 2016-05-29 DIAGNOSIS — Z1389 Encounter for screening for other disorder: Secondary | ICD-10-CM | POA: Diagnosis not present

## 2016-05-29 DIAGNOSIS — H409 Unspecified glaucoma: Secondary | ICD-10-CM | POA: Diagnosis not present

## 2016-05-29 DIAGNOSIS — N3281 Overactive bladder: Secondary | ICD-10-CM | POA: Diagnosis not present

## 2016-05-29 DIAGNOSIS — M797 Fibromyalgia: Secondary | ICD-10-CM | POA: Diagnosis not present

## 2016-05-29 DIAGNOSIS — E669 Obesity, unspecified: Secondary | ICD-10-CM | POA: Diagnosis not present

## 2016-05-29 DIAGNOSIS — G309 Alzheimer's disease, unspecified: Secondary | ICD-10-CM | POA: Diagnosis not present

## 2016-08-31 DIAGNOSIS — M47816 Spondylosis without myelopathy or radiculopathy, lumbar region: Secondary | ICD-10-CM | POA: Diagnosis not present

## 2016-10-09 ENCOUNTER — Other Ambulatory Visit: Payer: Self-pay

## 2016-10-09 DIAGNOSIS — Z1231 Encounter for screening mammogram for malignant neoplasm of breast: Secondary | ICD-10-CM

## 2016-10-18 DIAGNOSIS — H40053 Ocular hypertension, bilateral: Secondary | ICD-10-CM | POA: Diagnosis not present

## 2016-10-18 DIAGNOSIS — H1859 Other hereditary corneal dystrophies: Secondary | ICD-10-CM | POA: Diagnosis not present

## 2016-10-18 DIAGNOSIS — H04123 Dry eye syndrome of bilateral lacrimal glands: Secondary | ICD-10-CM | POA: Diagnosis not present

## 2016-10-18 DIAGNOSIS — Z961 Presence of intraocular lens: Secondary | ICD-10-CM | POA: Diagnosis not present

## 2016-11-20 ENCOUNTER — Ambulatory Visit
Admission: RE | Admit: 2016-11-20 | Discharge: 2016-11-20 | Disposition: A | Discharge status: Home / Self Care | Payer: Commercial Managed Care - HMO | Source: Ambulatory Visit

## 2016-11-20 ENCOUNTER — Other Ambulatory Visit: Payer: Self-pay | Admitting: Internal Medicine

## 2016-11-20 DIAGNOSIS — Z1231 Encounter for screening mammogram for malignant neoplasm of breast: Secondary | ICD-10-CM | POA: Diagnosis not present

## 2016-12-12 DIAGNOSIS — M259 Joint disorder, unspecified: Secondary | ICD-10-CM | POA: Diagnosis not present

## 2016-12-12 DIAGNOSIS — S3600XA Unspecified injury of spleen, initial encounter: Secondary | ICD-10-CM | POA: Diagnosis not present

## 2016-12-12 DIAGNOSIS — M47816 Spondylosis without myelopathy or radiculopathy, lumbar region: Secondary | ICD-10-CM | POA: Diagnosis not present

## 2016-12-12 DIAGNOSIS — M7062 Trochanteric bursitis, left hip: Secondary | ICD-10-CM | POA: Diagnosis not present

## 2016-12-15 ENCOUNTER — Other Ambulatory Visit: Payer: Self-pay | Admitting: Neurosurgery

## 2016-12-15 DIAGNOSIS — M7061 Trochanteric bursitis, right hip: Secondary | ICD-10-CM

## 2016-12-15 DIAGNOSIS — M7062 Trochanteric bursitis, left hip: Secondary | ICD-10-CM

## 2016-12-28 DIAGNOSIS — M810 Age-related osteoporosis without current pathological fracture: Secondary | ICD-10-CM | POA: Diagnosis not present

## 2016-12-28 DIAGNOSIS — G309 Alzheimer's disease, unspecified: Secondary | ICD-10-CM | POA: Diagnosis not present

## 2016-12-28 DIAGNOSIS — H409 Unspecified glaucoma: Secondary | ICD-10-CM | POA: Diagnosis not present

## 2016-12-28 DIAGNOSIS — E7800 Pure hypercholesterolemia, unspecified: Secondary | ICD-10-CM | POA: Diagnosis not present

## 2016-12-28 DIAGNOSIS — F558 Abuse of other non-psychoactive substances: Secondary | ICD-10-CM | POA: Diagnosis not present

## 2016-12-28 DIAGNOSIS — M797 Fibromyalgia: Secondary | ICD-10-CM | POA: Diagnosis not present

## 2016-12-28 DIAGNOSIS — I10 Essential (primary) hypertension: Secondary | ICD-10-CM | POA: Diagnosis not present

## 2016-12-28 DIAGNOSIS — Z6829 Body mass index (BMI) 29.0-29.9, adult: Secondary | ICD-10-CM | POA: Diagnosis not present

## 2016-12-28 DIAGNOSIS — I739 Peripheral vascular disease, unspecified: Secondary | ICD-10-CM | POA: Diagnosis not present

## 2017-01-15 ENCOUNTER — Ambulatory Visit
Admission: RE | Admit: 2017-01-15 | Discharge: 2017-01-15 | Disposition: A | Discharge status: Home / Self Care | Payer: Commercial Managed Care - HMO | Source: Ambulatory Visit | Attending: Neurosurgery | Admitting: Neurosurgery

## 2017-01-15 DIAGNOSIS — M7062 Trochanteric bursitis, left hip: Secondary | ICD-10-CM | POA: Diagnosis not present

## 2017-01-15 DIAGNOSIS — M7061 Trochanteric bursitis, right hip: Secondary | ICD-10-CM

## 2017-01-15 MED ORDER — METHYLPREDNISOLONE ACETATE 40 MG/ML INJ SUSP (RADIOLOG
120.0000 mg | Freq: Once | INTRAMUSCULAR | Status: AC
  Administered 2017-01-15: 120 mg via INTRA_ARTICULAR

## 2017-01-15 MED ORDER — IOPAMIDOL (ISOVUE-M 200) INJECTION 41%
1.0000 mL | Freq: Once | INTRAMUSCULAR | Status: AC
  Administered 2017-01-15: 1 mL

## 2017-01-15 NOTE — Discharge Instructions (Signed)

## 2017-03-27 DIAGNOSIS — M259 Joint disorder, unspecified: Secondary | ICD-10-CM | POA: Diagnosis not present

## 2017-03-27 DIAGNOSIS — M7062 Trochanteric bursitis, left hip: Secondary | ICD-10-CM | POA: Diagnosis not present

## 2017-03-27 DIAGNOSIS — M7061 Trochanteric bursitis, right hip: Secondary | ICD-10-CM | POA: Diagnosis not present

## 2017-03-27 DIAGNOSIS — M47816 Spondylosis without myelopathy or radiculopathy, lumbar region: Secondary | ICD-10-CM | POA: Diagnosis not present

## 2017-04-18 DIAGNOSIS — H1859 Other hereditary corneal dystrophies: Secondary | ICD-10-CM | POA: Diagnosis not present

## 2017-04-18 DIAGNOSIS — M810 Age-related osteoporosis without current pathological fracture: Secondary | ICD-10-CM | POA: Diagnosis not present

## 2017-04-18 DIAGNOSIS — Z961 Presence of intraocular lens: Secondary | ICD-10-CM | POA: Diagnosis not present

## 2017-04-18 DIAGNOSIS — H40053 Ocular hypertension, bilateral: Secondary | ICD-10-CM | POA: Diagnosis not present

## 2017-04-18 DIAGNOSIS — Z79899 Other long term (current) drug therapy: Secondary | ICD-10-CM | POA: Diagnosis not present

## 2017-04-18 DIAGNOSIS — Z6828 Body mass index (BMI) 28.0-28.9, adult: Secondary | ICD-10-CM | POA: Diagnosis not present

## 2017-05-08 ENCOUNTER — Ambulatory Visit (HOSPITAL_COMMUNITY)
Admission: RE | Admit: 2017-05-08 | Discharge: 2017-05-08 | Disposition: A | Discharge status: Home / Self Care | Payer: Medicare HMO | Source: Ambulatory Visit | Attending: Internal Medicine | Admitting: Internal Medicine

## 2017-05-08 ENCOUNTER — Encounter (HOSPITAL_COMMUNITY): Payer: Self-pay

## 2017-05-08 DIAGNOSIS — M810 Age-related osteoporosis without current pathological fracture: Secondary | ICD-10-CM | POA: Insufficient documentation

## 2017-05-08 MED ORDER — ZOLEDRONIC ACID 5 MG/100ML IV SOLN
5.0000 mg | Freq: Once | INTRAVENOUS | Status: AC
  Administered 2017-05-08: 5 mg via INTRAVENOUS
  Filled 2017-05-08: qty 100

## 2017-05-08 MED ORDER — SODIUM CHLORIDE 0.9 % IV SOLN
Freq: Once | INTRAVENOUS | Status: AC
  Administered 2017-05-08: 14:00:00 via INTRAVENOUS

## 2017-05-08 NOTE — Discharge Instructions (Signed)
Zoledronic Acid injection (Paget's Disease, Osteoporosis)/Reclast Infusion  °What is this medicine? °ZOLEDRONIC ACID (ZOE le dron ik AS id) lowers the amount of calcium loss from bone. It is used to treat Paget's disease and osteoporosis in women. °This medicine may be used for other purposes; ask your health care provider or pharmacist if you have questions. °COMMON BRAND NAME(S): Reclast, Zometa °What should I tell my health care provider before I take this medicine? °They need to know if you have any of these conditions: °-aspirin-sensitive asthma °-cancer, especially if you are receiving medicines used to treat cancer °-dental disease or wear dentures °-infection °-kidney disease °-low levels of calcium in the blood °-past surgery on the parathyroid gland or intestines °-receiving corticosteroids like dexamethasone or prednisone °-an unusual or allergic reaction to zoledronic acid, other medicines, foods, dyes, or preservatives °-pregnant or trying to get pregnant °-breast-feeding °How should I use this medicine? °This medicine is for infusion into a vein. It is given by a health care professional in a hospital or clinic setting. °Talk to your pediatrician regarding the use of this medicine in children. This medicine is not approved for use in children. °Overdosage: If you think you have taken too much of this medicine contact a poison control center or emergency room at once. °NOTE: This medicine is only for you. Do not share this medicine with others. °What if I miss a dose? °It is important not to miss your dose. Call your doctor or health care professional if you are unable to keep an appointment. °What may interact with this medicine? °-certain antibiotics given by injection °-NSAIDs, medicines for pain and inflammation, like ibuprofen or naproxen °-some diuretics like bumetanide, furosemide °-teriparatide °This list may not describe all possible interactions. Give your health care provider a list of all  the medicines, herbs, non-prescription drugs, or dietary supplements you use. Also tell them if you smoke, drink alcohol, or use illegal drugs. Some items may interact with your medicine. °What should I watch for while using this medicine? °Visit your doctor or health care professional for regular checkups. It may be some time before you see the benefit from this medicine. Do not stop taking your medicine unless your doctor tells you to. Your doctor may order blood tests or other tests to see how you are doing. °Women should inform their doctor if they wish to become pregnant or think they might be pregnant. There is a potential for serious side effects to an unborn child. Talk to your health care professional or pharmacist for more information. °You should make sure that you get enough calcium and vitamin D while you are taking this medicine. Discuss the foods you eat and the vitamins you take with your health care professional. °Some people who take this medicine have severe bone, joint, and/or muscle pain. This medicine may also increase your risk for jaw problems or a broken thigh bone. Tell your doctor right away if you have severe pain in your jaw, bones, joints, or muscles. Tell your doctor if you have any pain that does not go away or that gets worse. °Tell your dentist and dental surgeon that you are taking this medicine. You should not have major dental surgery while on this medicine. See your dentist to have a dental exam and fix any dental problems before starting this medicine. Take good care of your teeth while on this medicine. Make sure you see your dentist for regular follow-up appointments. °What side effects may I notice from receiving   this medicine? °Side effects that you should report to your doctor or health care professional as soon as possible: °-allergic reactions like skin rash, itching or hives, swelling of the face, lips, or tongue °-anxiety, confusion, or depression °-breathing  problems °-changes in vision °-eye pain °-feeling faint or lightheaded, falls °-jaw pain, especially after dental work °-mouth sores °-muscle cramps, stiffness, or weakness °-redness, blistering, peeling or loosening of the skin, including inside the mouth °-trouble passing urine or change in the amount of urine °Side effects that usually do not require medical attention (report to your doctor or health care professional if they continue or are bothersome): °-bone, joint, or muscle pain °-constipation °-diarrhea °-fever °-hair loss °-irritation at site where injected °-loss of appetite °-nausea, vomiting °-stomach upset °-trouble sleeping °-trouble swallowing °-weak or tired °This list may not describe all possible side effects. Call your doctor for medical advice about side effects. You may report side effects to FDA at 1-800-FDA-1088. °Where should I keep my medicine? °This drug is given in a hospital or clinic and will not be stored at home. °NOTE: This sheet is a summary. It may not cover all possible information. If you have questions about this medicine, talk to your doctor, pharmacist, or health care provider. °© 2018 [employer]/Gold Standard (2014-05-09 14:19:57) ° °

## 2017-05-28 DIAGNOSIS — I10 Essential (primary) hypertension: Secondary | ICD-10-CM | POA: Diagnosis not present

## 2017-05-28 DIAGNOSIS — R358 Other polyuria: Secondary | ICD-10-CM | POA: Diagnosis not present

## 2017-05-28 DIAGNOSIS — E559 Vitamin D deficiency, unspecified: Secondary | ICD-10-CM | POA: Diagnosis not present

## 2017-05-28 DIAGNOSIS — E7800 Pure hypercholesterolemia, unspecified: Secondary | ICD-10-CM | POA: Diagnosis not present

## 2017-06-01 DIAGNOSIS — N3281 Overactive bladder: Secondary | ICD-10-CM | POA: Diagnosis not present

## 2017-06-01 DIAGNOSIS — Z0000 Encounter for general adult medical examination without abnormal findings: Secondary | ICD-10-CM | POA: Diagnosis not present

## 2017-06-01 DIAGNOSIS — G308 Other Alzheimer's disease: Secondary | ICD-10-CM | POA: Diagnosis not present

## 2017-06-01 DIAGNOSIS — H9193 Unspecified hearing loss, bilateral: Secondary | ICD-10-CM | POA: Diagnosis not present

## 2017-06-01 DIAGNOSIS — E7800 Pure hypercholesterolemia, unspecified: Secondary | ICD-10-CM | POA: Diagnosis not present

## 2017-06-01 DIAGNOSIS — M810 Age-related osteoporosis without current pathological fracture: Secondary | ICD-10-CM | POA: Diagnosis not present

## 2017-06-01 DIAGNOSIS — E559 Vitamin D deficiency, unspecified: Secondary | ICD-10-CM | POA: Diagnosis not present

## 2017-06-01 DIAGNOSIS — H6123 Impacted cerumen, bilateral: Secondary | ICD-10-CM | POA: Diagnosis not present

## 2017-06-01 DIAGNOSIS — I10 Essential (primary) hypertension: Secondary | ICD-10-CM | POA: Diagnosis not present

## 2017-08-08 DIAGNOSIS — M47816 Spondylosis without myelopathy or radiculopathy, lumbar region: Secondary | ICD-10-CM | POA: Diagnosis not present

## 2017-10-17 DIAGNOSIS — Z23 Encounter for immunization: Secondary | ICD-10-CM | POA: Diagnosis not present

## 2017-11-06 DIAGNOSIS — H40053 Ocular hypertension, bilateral: Secondary | ICD-10-CM | POA: Diagnosis not present

## 2017-11-06 DIAGNOSIS — H1859 Other hereditary corneal dystrophies: Secondary | ICD-10-CM | POA: Diagnosis not present

## 2017-11-06 DIAGNOSIS — Z961 Presence of intraocular lens: Secondary | ICD-10-CM | POA: Diagnosis not present

## 2017-11-13 DIAGNOSIS — M47812 Spondylosis without myelopathy or radiculopathy, cervical region: Secondary | ICD-10-CM | POA: Diagnosis not present

## 2017-11-30 DIAGNOSIS — I739 Peripheral vascular disease, unspecified: Secondary | ICD-10-CM | POA: Diagnosis not present

## 2017-11-30 DIAGNOSIS — N3281 Overactive bladder: Secondary | ICD-10-CM | POA: Diagnosis not present

## 2017-11-30 DIAGNOSIS — M810 Age-related osteoporosis without current pathological fracture: Secondary | ICD-10-CM | POA: Diagnosis not present

## 2017-11-30 DIAGNOSIS — E7800 Pure hypercholesterolemia, unspecified: Secondary | ICD-10-CM | POA: Diagnosis not present

## 2017-11-30 DIAGNOSIS — I10 Essential (primary) hypertension: Secondary | ICD-10-CM | POA: Diagnosis not present

## 2017-11-30 DIAGNOSIS — E559 Vitamin D deficiency, unspecified: Secondary | ICD-10-CM | POA: Diagnosis not present

## 2017-11-30 DIAGNOSIS — E668 Other obesity: Secondary | ICD-10-CM | POA: Diagnosis not present

## 2017-11-30 DIAGNOSIS — H9193 Unspecified hearing loss, bilateral: Secondary | ICD-10-CM | POA: Diagnosis not present

## 2017-11-30 DIAGNOSIS — G309 Alzheimer's disease, unspecified: Secondary | ICD-10-CM | POA: Diagnosis not present

## 2018-02-14 DIAGNOSIS — M47816 Spondylosis without myelopathy or radiculopathy, lumbar region: Secondary | ICD-10-CM | POA: Diagnosis not present

## 2018-02-14 DIAGNOSIS — M542 Cervicalgia: Secondary | ICD-10-CM | POA: Diagnosis not present

## 2018-05-16 DIAGNOSIS — M542 Cervicalgia: Secondary | ICD-10-CM | POA: Diagnosis not present

## 2018-05-16 DIAGNOSIS — M47816 Spondylosis without myelopathy or radiculopathy, lumbar region: Secondary | ICD-10-CM | POA: Diagnosis not present

## 2018-06-03 DIAGNOSIS — E559 Vitamin D deficiency, unspecified: Secondary | ICD-10-CM | POA: Diagnosis not present

## 2018-06-03 DIAGNOSIS — I10 Essential (primary) hypertension: Secondary | ICD-10-CM | POA: Diagnosis not present

## 2018-06-03 DIAGNOSIS — E7800 Pure hypercholesterolemia, unspecified: Secondary | ICD-10-CM | POA: Diagnosis not present

## 2018-06-07 DIAGNOSIS — Z0000 Encounter for general adult medical examination without abnormal findings: Secondary | ICD-10-CM | POA: Diagnosis not present

## 2018-06-07 DIAGNOSIS — G309 Alzheimer's disease, unspecified: Secondary | ICD-10-CM | POA: Diagnosis not present

## 2018-06-07 DIAGNOSIS — N3281 Overactive bladder: Secondary | ICD-10-CM | POA: Diagnosis not present

## 2018-06-07 DIAGNOSIS — E7800 Pure hypercholesterolemia, unspecified: Secondary | ICD-10-CM | POA: Diagnosis not present

## 2018-06-07 DIAGNOSIS — I10 Essential (primary) hypertension: Secondary | ICD-10-CM | POA: Diagnosis not present

## 2018-06-07 DIAGNOSIS — H9193 Unspecified hearing loss, bilateral: Secondary | ICD-10-CM | POA: Diagnosis not present

## 2018-06-07 DIAGNOSIS — I7389 Other specified peripheral vascular diseases: Secondary | ICD-10-CM | POA: Diagnosis not present

## 2018-06-07 DIAGNOSIS — M810 Age-related osteoporosis without current pathological fracture: Secondary | ICD-10-CM | POA: Diagnosis not present

## 2018-06-07 DIAGNOSIS — M1990 Unspecified osteoarthritis, unspecified site: Secondary | ICD-10-CM | POA: Diagnosis not present

## 2018-06-10 DIAGNOSIS — Z1212 Encounter for screening for malignant neoplasm of rectum: Secondary | ICD-10-CM | POA: Diagnosis not present

## 2018-08-20 DIAGNOSIS — M542 Cervicalgia: Secondary | ICD-10-CM | POA: Diagnosis not present

## 2018-08-20 DIAGNOSIS — M47816 Spondylosis without myelopathy or radiculopathy, lumbar region: Secondary | ICD-10-CM | POA: Diagnosis not present

## 2018-10-08 DIAGNOSIS — M810 Age-related osteoporosis without current pathological fracture: Secondary | ICD-10-CM | POA: Diagnosis not present

## 2018-10-08 DIAGNOSIS — E7800 Pure hypercholesterolemia, unspecified: Secondary | ICD-10-CM | POA: Diagnosis not present

## 2018-10-08 DIAGNOSIS — E559 Vitamin D deficiency, unspecified: Secondary | ICD-10-CM | POA: Diagnosis not present

## 2018-10-08 DIAGNOSIS — M797 Fibromyalgia: Secondary | ICD-10-CM | POA: Diagnosis not present

## 2018-10-08 DIAGNOSIS — G309 Alzheimer's disease, unspecified: Secondary | ICD-10-CM | POA: Diagnosis not present

## 2018-10-08 DIAGNOSIS — I7389 Other specified peripheral vascular diseases: Secondary | ICD-10-CM | POA: Diagnosis not present

## 2018-10-08 DIAGNOSIS — F558 Abuse of other non-psychoactive substances: Secondary | ICD-10-CM | POA: Diagnosis not present

## 2018-10-08 DIAGNOSIS — M1990 Unspecified osteoarthritis, unspecified site: Secondary | ICD-10-CM | POA: Diagnosis not present

## 2018-10-08 DIAGNOSIS — I10 Essential (primary) hypertension: Secondary | ICD-10-CM | POA: Diagnosis not present

## 2018-10-22 DIAGNOSIS — N390 Urinary tract infection, site not specified: Secondary | ICD-10-CM | POA: Diagnosis not present

## 2018-10-22 DIAGNOSIS — R3129 Other microscopic hematuria: Secondary | ICD-10-CM | POA: Diagnosis not present

## 2018-10-22 DIAGNOSIS — R110 Nausea: Secondary | ICD-10-CM | POA: Diagnosis not present

## 2018-10-22 DIAGNOSIS — Z6826 Body mass index (BMI) 26.0-26.9, adult: Secondary | ICD-10-CM | POA: Diagnosis not present

## 2018-10-22 DIAGNOSIS — I10 Essential (primary) hypertension: Secondary | ICD-10-CM | POA: Diagnosis not present

## 2018-10-25 ENCOUNTER — Encounter (HOSPITAL_COMMUNITY): Payer: Self-pay | Admitting: Emergency Medicine

## 2018-10-25 ENCOUNTER — Inpatient Hospital Stay (HOSPITAL_COMMUNITY): Payer: Medicare HMO

## 2018-10-25 ENCOUNTER — Other Ambulatory Visit: Payer: Self-pay

## 2018-10-25 ENCOUNTER — Emergency Department (HOSPITAL_COMMUNITY): Payer: Medicare HMO

## 2018-10-25 ENCOUNTER — Inpatient Hospital Stay (HOSPITAL_COMMUNITY)
Admission: EM | Admit: 2018-10-25 | Discharge: 2018-10-27 | DRG: 193 | Disposition: A | Discharge status: Home Health | Payer: Medicare HMO | Attending: Internal Medicine | Admitting: Internal Medicine

## 2018-10-25 DIAGNOSIS — M810 Age-related osteoporosis without current pathological fracture: Secondary | ICD-10-CM | POA: Diagnosis not present

## 2018-10-25 DIAGNOSIS — Z90710 Acquired absence of both cervix and uterus: Secondary | ICD-10-CM | POA: Diagnosis not present

## 2018-10-25 DIAGNOSIS — G9341 Metabolic encephalopathy: Secondary | ICD-10-CM | POA: Diagnosis not present

## 2018-10-25 DIAGNOSIS — R112 Nausea with vomiting, unspecified: Secondary | ICD-10-CM | POA: Diagnosis present

## 2018-10-25 DIAGNOSIS — F0390 Unspecified dementia without behavioral disturbance: Secondary | ICD-10-CM | POA: Diagnosis present

## 2018-10-25 DIAGNOSIS — Z981 Arthrodesis status: Secondary | ICD-10-CM

## 2018-10-25 DIAGNOSIS — I160 Hypertensive urgency: Secondary | ICD-10-CM

## 2018-10-25 DIAGNOSIS — I959 Hypotension, unspecified: Secondary | ICD-10-CM | POA: Diagnosis present

## 2018-10-25 DIAGNOSIS — Z7982 Long term (current) use of aspirin: Secondary | ICD-10-CM

## 2018-10-25 DIAGNOSIS — M549 Dorsalgia, unspecified: Secondary | ICD-10-CM | POA: Diagnosis present

## 2018-10-25 DIAGNOSIS — J189 Pneumonia, unspecified organism: Principal | ICD-10-CM

## 2018-10-25 DIAGNOSIS — K5900 Constipation, unspecified: Secondary | ICD-10-CM | POA: Diagnosis present

## 2018-10-25 DIAGNOSIS — G8929 Other chronic pain: Secondary | ICD-10-CM | POA: Diagnosis not present

## 2018-10-25 DIAGNOSIS — R531 Weakness: Secondary | ICD-10-CM | POA: Diagnosis not present

## 2018-10-25 DIAGNOSIS — Z87891 Personal history of nicotine dependence: Secondary | ICD-10-CM | POA: Diagnosis not present

## 2018-10-25 DIAGNOSIS — I10 Essential (primary) hypertension: Secondary | ICD-10-CM | POA: Diagnosis present

## 2018-10-25 DIAGNOSIS — R0902 Hypoxemia: Secondary | ICD-10-CM | POA: Diagnosis not present

## 2018-10-25 DIAGNOSIS — Z79899 Other long term (current) drug therapy: Secondary | ICD-10-CM

## 2018-10-25 DIAGNOSIS — H9190 Unspecified hearing loss, unspecified ear: Secondary | ICD-10-CM | POA: Diagnosis present

## 2018-10-25 DIAGNOSIS — Z79891 Long term (current) use of opiate analgesic: Secondary | ICD-10-CM

## 2018-10-25 DIAGNOSIS — Z96651 Presence of right artificial knee joint: Secondary | ICD-10-CM | POA: Diagnosis present

## 2018-10-25 DIAGNOSIS — R42 Dizziness and giddiness: Secondary | ICD-10-CM | POA: Diagnosis not present

## 2018-10-25 DIAGNOSIS — R410 Disorientation, unspecified: Secondary | ICD-10-CM | POA: Diagnosis present

## 2018-10-25 DIAGNOSIS — H409 Unspecified glaucoma: Secondary | ICD-10-CM | POA: Diagnosis present

## 2018-10-25 DIAGNOSIS — N132 Hydronephrosis with renal and ureteral calculous obstruction: Secondary | ICD-10-CM | POA: Diagnosis not present

## 2018-10-25 DIAGNOSIS — M5440 Lumbago with sciatica, unspecified side: Secondary | ICD-10-CM | POA: Diagnosis not present

## 2018-10-25 DIAGNOSIS — R4182 Altered mental status, unspecified: Secondary | ICD-10-CM | POA: Diagnosis not present

## 2018-10-25 DIAGNOSIS — J9811 Atelectasis: Secondary | ICD-10-CM | POA: Diagnosis not present

## 2018-10-25 DIAGNOSIS — E785 Hyperlipidemia, unspecified: Secondary | ICD-10-CM | POA: Diagnosis present

## 2018-10-25 LAB — CBC
HCT: 40.7 % (ref 36.0–46.0)
HCT: 38.7 % (ref 36.0–46.0)
Hemoglobin: 12.7 g/dL (ref 12.0–15.0)
Hemoglobin: 12 g/dL (ref 12.0–15.0)
MCH: 27.7 pg (ref 26.0–34.0)
MCH: 27.1 pg (ref 26.0–34.0)
MCHC: 31.2 g/dL (ref 30.0–36.0)
MCHC: 31 g/dL (ref 30.0–36.0)
MCV: 88.9 fL (ref 80.0–100.0)
MCV: 87.6 fL (ref 80.0–100.0)
Platelets: 185 10*3/uL (ref 150–400)
Platelets: 168 10*3/uL (ref 150–400)
RBC: 4.58 MIL/uL (ref 3.87–5.11)
RBC: 4.42 MIL/uL (ref 3.87–5.11)
RDW: 13.4 % (ref 11.5–15.5)
RDW: 13.4 % (ref 11.5–15.5)
WBC: 10.5 10*3/uL (ref 4.0–10.5)
WBC: 8.6 10*3/uL (ref 4.0–10.5)
nRBC: 0 % (ref 0.0–0.2)
nRBC: 0 % (ref 0.0–0.2)

## 2018-10-25 LAB — COMPREHENSIVE METABOLIC PANEL
ALT: 15 U/L (ref 0–44)
AST: 22 U/L (ref 15–41)
Albumin: 3.7 g/dL (ref 3.5–5.0)
Alkaline Phosphatase: 57 U/L (ref 38–126)
Anion gap: 6 (ref 5–15)
BUN: 13 mg/dL (ref 8–23)
CO2: 26 mmol/L (ref 22–32)
Calcium: 9 mg/dL (ref 8.9–10.3)
Chloride: 107 mmol/L (ref 98–111)
Creatinine, Ser: 0.95 mg/dL (ref 0.44–1.00)
GFR calc Af Amer: >60 mL/min (ref >60)
GFR calc non Af Amer: 54 mL/min — ABNORMAL LOW (ref >60)
Glucose, Bld: 132 mg/dL — ABNORMAL HIGH (ref 70–99)
Potassium: 3.9 mmol/L (ref 3.5–5.1)
Sodium: 139 mmol/L (ref 135–145)
Total Bilirubin: 0.5 mg/dL (ref 0.3–1.2)
Total Protein: 6.2 g/dL — ABNORMAL LOW (ref 6.5–8.1)

## 2018-10-25 LAB — CREATININE, SERUM
Creatinine, Ser: 0.79 mg/dL (ref 0.44–1.00)
GFR calc Af Amer: >60 mL/min (ref >60)
GFR calc non Af Amer: >60 mL/min (ref >60)

## 2018-10-25 LAB — URINALYSIS, ROUTINE W REFLEX MICROSCOPIC
Bacteria, UA: NONE SEEN
Bilirubin Urine: NEGATIVE
Glucose, UA: NEGATIVE mg/dL
Hgb urine dipstick: NEGATIVE
Ketones, ur: NEGATIVE mg/dL
Nitrite: NEGATIVE
Protein, ur: NEGATIVE mg/dL
Specific Gravity, Urine: 1.02 (ref 1.005–1.030)
pH: 5 (ref 5.0–8.0)

## 2018-10-25 MED ORDER — ACETAMINOPHEN 650 MG RE SUPP: 650.0000 mg | Freq: Four times a day (QID) | RECTAL | Status: DC | PRN

## 2018-10-25 MED ORDER — MORPHINE SULFATE 15 MG PO TABS
30.0000 mg | ORAL_TABLET | Freq: Four times a day (QID) | ORAL | Status: DC | PRN
  Administered 2018-10-27: 30 mg via ORAL
  Filled 2018-10-25 (×2): qty 2

## 2018-10-25 MED ORDER — ONDANSETRON HCL 4 MG/2ML IJ SOLN: 4.0000 mg | Freq: Four times a day (QID) | INTRAMUSCULAR | Status: DC | PRN

## 2018-10-25 MED ORDER — ACETAMINOPHEN 325 MG PO TABS
650.0000 mg | ORAL_TABLET | Freq: Four times a day (QID) | ORAL | Status: DC | PRN
  Administered 2018-10-26 (×2): 650 mg via ORAL
  Filled 2018-10-25 (×2): qty 2

## 2018-10-25 MED ORDER — SERTRALINE HCL 50 MG PO TABS
150.0000 mg | ORAL_TABLET | Freq: Every day | ORAL | Status: DC
  Administered 2018-10-25 – 2018-10-26 (×2): 150 mg via ORAL
  Filled 2018-10-25 (×2): qty 1

## 2018-10-25 MED ORDER — METOPROLOL TARTRATE 25 MG PO TABS
50.0000 mg | ORAL_TABLET | Freq: Every day | ORAL | Status: DC
  Administered 2018-10-26: 50 mg via ORAL
  Filled 2018-10-25: qty 2

## 2018-10-25 MED ORDER — ENOXAPARIN SODIUM 40 MG/0.4ML ~~LOC~~ SOLN
40.0000 mg | Freq: Every day | SUBCUTANEOUS | Status: DC
  Administered 2018-10-25 – 2018-10-26 (×2): 40 mg via SUBCUTANEOUS
  Filled 2018-10-25 (×2): qty 0.4

## 2018-10-25 MED ORDER — ACETAMINOPHEN 325 MG PO TABS
650.0000 mg | ORAL_TABLET | Freq: Once | ORAL | Status: AC | PRN
  Administered 2018-10-25: 650 mg via ORAL
  Filled 2018-10-25: qty 2

## 2018-10-25 MED ORDER — HYDRALAZINE HCL 20 MG/ML IJ SOLN
5.0000 mg | INTRAMUSCULAR | Status: DC | PRN
  Administered 2018-10-26: 5 mg via INTRAVENOUS
  Filled 2018-10-25: qty 1

## 2018-10-25 MED ORDER — SODIUM CHLORIDE 0.9 % IV SOLN
1.0000 g | INTRAVENOUS | Status: DC
  Administered 2018-10-26: 1 g via INTRAVENOUS
  Filled 2018-10-25: qty 10

## 2018-10-25 MED ORDER — SODIUM CHLORIDE 0.9 % IV SOLN
500.0000 mg | Freq: Once | INTRAVENOUS | Status: AC
  Administered 2018-10-25: 500 mg via INTRAVENOUS
  Filled 2018-10-25: qty 500

## 2018-10-25 MED ORDER — LATANOPROST 0.005 % OP SOLN
1.0000 [drp] | Freq: Every day | OPHTHALMIC | Status: DC
  Administered 2018-10-25 – 2018-10-26 (×2): 1 [drp] via OPHTHALMIC
  Filled 2018-10-25: qty 2.5

## 2018-10-25 MED ORDER — ASPIRIN EC 81 MG PO TBEC
81.0000 mg | DELAYED_RELEASE_TABLET | Freq: Every day | ORAL | Status: DC
  Administered 2018-10-25 – 2018-10-27 (×3): 81 mg via ORAL
  Filled 2018-10-25 (×3): qty 1

## 2018-10-25 MED ORDER — SODIUM CHLORIDE 0.9 % IV SOLN
500.0000 mg | INTRAVENOUS | Status: DC
  Administered 2018-10-26: 500 mg via INTRAVENOUS
  Filled 2018-10-25 (×2): qty 500

## 2018-10-25 MED ORDER — DORZOLAMIDE HCL-TIMOLOL MAL 2-0.5 % OP SOLN
1.0000 [drp] | Freq: Two times a day (BID) | OPHTHALMIC | Status: DC
  Administered 2018-10-25 – 2018-10-27 (×4): 1 [drp] via OPHTHALMIC
  Filled 2018-10-25: qty 10

## 2018-10-25 MED ORDER — SODIUM CHLORIDE 0.9 % IV SOLN
1.0000 g | Freq: Once | INTRAVENOUS | Status: AC
  Administered 2018-10-25: 1 g via INTRAVENOUS
  Filled 2018-10-25: qty 10

## 2018-10-25 MED ORDER — ONDANSETRON HCL 4 MG PO TABS: 4.0000 mg | ORAL_TABLET | Freq: Four times a day (QID) | ORAL | Status: DC | PRN

## 2018-10-25 MED ORDER — AMLODIPINE BESYLATE 5 MG PO TABS
5.0000 mg | ORAL_TABLET | Freq: Every day | ORAL | Status: DC
  Administered 2018-10-25 – 2018-10-26 (×2): 5 mg via ORAL
  Filled 2018-10-25 (×2): qty 1

## 2018-10-25 MED ORDER — SODIUM CHLORIDE 0.9 % IV SOLN
INTRAVENOUS | Status: AC
  Administered 2018-10-25: via INTRAVENOUS

## 2018-10-25 MED ORDER — SIMVASTATIN 20 MG PO TABS
20.0000 mg | ORAL_TABLET | Freq: Every day | ORAL | Status: DC
  Administered 2018-10-25 – 2018-10-26 (×2): 20 mg via ORAL
  Filled 2018-10-25 (×2): qty 1

## 2018-10-25 NOTE — ED Notes (Signed)
Attempted report x1. Left call back number with secretary.  

## 2018-10-25 NOTE — ED Notes (Addendum)
Pt O2 sats 92% on RA, does not wear oxygen at home.

## 2018-10-25 NOTE — ED Provider Notes (Signed)
[facility] EMERGENCY DEPARTMENT Provider Note   CSN: [id number] Arrival date & time: 10/25/18  1359     History   Chief Complaint Chief Complaint  Patient presents with  . Altered Mental Status    HPI Cynthia Guzman is a 84 y.o. female.  84 year old female with prior medical history as documented below presents for evaluation of fatigue and weakness.  Patient is a poor historian.  Majority of history obtained from the patient's daughter who is present at the bedside.  Daughter reports that the patient has had several days of increasing fatigue and weakness.  No reported fevers at home.  Upon arrival to the ED the patient is noted to be febrile and mildly hypoxic.  She does not have a baseline O2 requirement.  Patient denies chest pain, nausea, vomiting, abdominal pain, or other acute complaint.  The history is provided by the patient, a relative and medical records.  Altered Mental Status   This is a new problem. The current episode started more than 2 days ago. The problem has not changed since onset.Associated symptoms include weakness. Pertinent negatives include no confusion and no somnolence.    Past Medical History:  Diagnosis Date  . Chronic back pain   . Complication of anesthesia    hard to wake up  . Depression   . eczema   . Glaucoma   . Hearing impairment   . Hypertension   . Memory impairment   . Osteoporosis     Patient Active Problem List   Diagnosis Date Noted  . CAP (community acquired pneumonia) 10/25/2018  . Postop Acute blood loss anemia 11/19/2012  . OA (osteoarthritis) of knee 11/18/2012    Past Surgical History:  Procedure Laterality Date  . ABDOMINAL HYSTERECTOMY    . BACK SURGERY     x 3  . DILATION AND CURETTAGE OF UTERUS    . EYE SURGERY     bilateral cataracts with lens implants  . HAND SURGERY     left hand surgery  . TOTAL KNEE ARTHROPLASTY  11/18/2012   Procedure: TOTAL KNEE ARTHROPLASTY;  Surgeon:  Loanne Drilling, MD;  Location: [facility] ORS;  Service: Orthopedics;  Laterality: Right;     OB History   None      Home Medications    Prior to Admission medications   Medication Sig Start Date End Date Taking? Authorizing Provider  amLODipine (NORVASC) 5 MG tablet Take 5 mg by mouth at bedtime.    Yes [provider]  aspirin EC 81 MG tablet Take 81 mg by mouth daily.   Yes [provider]  Cholecalciferol (VITAMIN D) 2000 units CAPS Take 2,000 Units by mouth daily.   Yes [provider]  dorzolamide-timolol (COSOPT) 22.3-6.8 MG/ML ophthalmic solution Place 1 drop into both eyes 2 (two) times daily.   Yes [provider]  latanoprost (XALATAN) 0.005 % ophthalmic solution Place 1 drop into both eyes at bedtime.   Yes [provider]  metoprolol (LOPRESSOR) 50 MG tablet Take 50 mg by mouth daily.    Yes [provider]  morphine (MSIR) 30 MG tablet Take 30 mg by mouth every 6 (six) hours as needed for moderate pain. Pain   Yes [provider]  promethazine (PHENERGAN) 25 MG tablet Take 25 mg by mouth 4 (four) times daily.   Yes [provider]  sertraline (ZOLOFT) 100 MG tablet Take 150 mg by mouth at bedtime.    Yes [provider]  simvastatin (ZOCOR) 20 MG tablet Take 20 mg by mouth at bedtime.    Yes [provider]  zoledronic acid (RECLAST) 5 MG/100ML SOLN injection Inject 5 mg into the vein once.   Yes [provider]  methocarbamol (ROBAXIN) 500 MG tablet Take 1 tablet (500 mg total) by mouth every 6 (six) hours as needed. Patient not taking: Reported on 10/25/2018 11/20/12   Julien Girt, Alexzandrew L, PA-C  oxyCODONE (OXY IR/ROXICODONE) 5 MG immediate release tablet Take 1-2 tablets (5-10 mg total) by mouth every 3 (three) hours as needed. Patient not taking: Reported on 10/25/2018 11/20/12   Lauraine Rinne, PA-C    Family History History reviewed. No pertinent family  history.  Social History Social History   Tobacco Use  . Smoking status: Former Smoker    Packs/day: 1.00    Years: 60.00    Pack years: 60.00    Types: Cigarettes    Last attempt to quit: 12/26/2015    Years since quitting: 2.8  . Smokeless tobacco: Never Used  Substance Use Topics  . Alcohol use: No  . Drug use: No     Allergies   Arthritis pain regimen [aspirin]   Review of Systems Review of Systems  Neurological: Positive for weakness.  Psychiatric/Behavioral: Negative for confusion.  All other systems reviewed and are negative.    Physical Exam Updated Vital Signs BP (!) 164/105   Pulse (!) 59   Temp 98.4 F (36.9 C) (Oral)   Resp 16   Ht 5\' 3"  (1.6 m)   Wt 68.5 kg   SpO2 96%   BMI 26.75 kg/m   Physical Exam  Constitutional: She is oriented to person, place, and time. She appears well-developed and well-nourished. No distress.  HENT:  Head: Normocephalic and atraumatic.  Mouth/Throat: Oropharynx is clear and moist.  Eyes: Pupils are equal, round, and reactive to light. Conjunctivae and EOM are normal.  Neck: Normal range of motion. Neck supple.  Cardiovascular: Normal rate, regular rhythm and normal heart sounds.  Pulmonary/Chest: Effort normal and breath sounds normal. No respiratory distress.  Abdominal: Soft. She exhibits no distension. There is no tenderness.  Musculoskeletal: Normal range of motion. She exhibits no edema or deformity.  Neurological: She is alert and oriented to person, place, and time.  Skin: Skin is warm and dry.  Psychiatric: She has a normal mood and affect.  Nursing note and vitals reviewed.    ED Treatments / Results  Labs (all labs ordered are listed, but only abnormal results are displayed) Labs Reviewed  COMPREHENSIVE METABOLIC PANEL - Abnormal; Notable for the following components:      Result Value   Glucose, Bld 132 (*)    Total Protein 6.2 (*)    GFR calc non Af Amer 54 (*)    All other components within  normal limits  CULTURE, BLOOD (ROUTINE X 2)  CULTURE, BLOOD (ROUTINE X 2)  CBC  URINALYSIS, ROUTINE W REFLEX MICROSCOPIC    EKG EKG Interpretation  Date/Time:  Friday October 25 2018 14:31:48 EDT Ventricular Rate:  67 PR Interval:  176 QRS Duration: 74 QT Interval:  414 QTC Calculation: 437 R Axis:   -2 Text Interpretation:  Normal sinus rhythm Normal ECG Confirmed by Kristine Royal [phone]) on 10/25/2018 6:19:07 PM   Radiology Dg Chest 2 View  Result Date: 10/25/2018 CLINICAL DATA:  Altered mental status.  Hypoxia.  Fever. EXAM: CHEST - 2 VIEW COMPARISON:  Chest x-ray 11/12/2012.  CT 12/21/2009. FINDINGS: Patient  is rotated to the right. Mild fullness of the anterior mediastinum is again noted consistent with prominent anterior mediastinal fat previously noted. Cardiomegaly with normal pulmonary vascularity. Tortuous thoracic aorta. Mild bibasilar subsegmental atelectasis/infiltrate. No focal alveolar infiltrate. Calcified pulmonary nodules noted consistent with prior granulomas disease. No pleural effusion or pneumothorax. Degenerative change thoracic spine and both shoulders. High-riding both shoulders consistent bilateral rotator cuff tears. Prior lumbar fusion. IMPRESSION: Mild bibasilar subsegmental atelectasis/infiltrates. 2. Cardiomegaly. No pulmonary venous congestion. Tortuous thoracic aorta. Electronically Signed   By: Maisie Fus  Register   On: 10/25/2018 15:42    Procedures Procedures (including critical care time)  Medications Ordered in ED Medications  acetaminophen (TYLENOL) tablet 650 mg (650 mg Oral Given 10/25/18 1501)  cefTRIAXone (ROCEPHIN) 1 g in sodium chloride 0.9 % 100 mL IVPB (1 g Intravenous New Bag/Given 10/25/18 1942)  azithromycin (ZITHROMAX) 500 mg in sodium chloride 0.9 % 250 mL IVPB (500 mg Intravenous New Bag/Given 10/25/18 1943)     Initial Impression / Assessment and Plan / ED Course  I have reviewed the triage vital signs and the nursing  notes.  Pertinent labs & imaging results that were available during my care of the patient were reviewed by me and considered in my medical decision making (see chart for details).     MDM  Screen complete  Patient is presenting for evaluation of generalized weakness and fatigue.  Initial presentation is concerning for likely pneumonia.  Given patient's fever and hypoxia patient was treated with broad-spectrum antibiotics.  Chest x-ray suggested bibasilar infiltrates.  Other screening labs thus far obtained do not suggest other acute pathology.  Patient will be admitted for further work-up and treatment.  Hospitalist service is aware of case will evaluate for admission.  Final Clinical Impressions(s) / ED Diagnoses   Final diagnoses:  Community acquired pneumonia, unspecified laterality    ED Discharge Orders    None       Wynetta Fines, MD 10/25/18 2059

## 2018-10-25 NOTE — ED Notes (Signed)
Pure wick placed.

## 2018-10-25 NOTE — ED Triage Notes (Signed)
Pt to ER for evaluation of altered mental status and fever today sent by PCP recommendation. Pt in NAD at this time. Daughter reports she experienced nausea and vomiting on Tuesday, saw PCP and received medication for this. Pt denies pain at this time aside from her chronic arthritic pain.

## 2018-10-25 NOTE — ED Notes (Addendum)
Patient transported to CT 

## 2018-10-25 NOTE — H&P (Signed)
History and Physical    Cynthia Guzman ZOX:[id number] DOB: 08-28-1932 DOA: 10/25/2018  PCP: Gaspar Garbe, MD  Patient coming from: Home.  Chief Complaint: Confusion and elevated blood pressure.  HPI: Cynthia Guzman is a 84 y.o. female with history of hypertension, chronic back pain, dementia, depression was brought to the ER after patient was found to be having markedly elevated blood pressure.  Patient also was found to be confused.  Patient started experiencing dizziness with nausea vomiting last Saturday on October 26 which initially was 4-5 episodes every day with dizziness which happens only on standing.  Patient states she was not dizzy on lying down.  Did not have any weakness of the upper or lower extremities.  Due to persistent symptoms patient went to her PCP on Tuesday, October 29 and was prescribed Phenergan following which patient with nausea vomiting improved and has been able to eat well.  Her dizziness still persist on standing.  And over the last 24 hours patient's daughter noticed that patient has been having some fever has not had any productive cough or diarrhea.  Patient had appointment with her neurosurgeon today at around noon time and during which patient was found to be markedly hypertensive and confused.  Patient was instructed to come to the ER.  Patient also had original follow-up with primary care physician for the dizziness and vomiting which was canceled and patient was brought to the ER.  Patient otherwise denies any chest pain shortness of breath headache visual symptoms or any focal deficits of the extremities.  ED Course: In the ER patient had elevated blood pressure and was febrile with temperature 101 F.  Chest x-ray shows features concerning for pneumonia.  UA is also showing possibility of UTI.  On exam patient has left flank tenderness.  Patient was started on empiric antibiotics for pneumonia.  UA is pending CT head and CT of the abdomen is also  pending.  Patient is found to be hypoxic in the ER.  Review of Systems: As per HPI, rest all negative.   Past Medical History:  Diagnosis Date  . Chronic back pain   . Complication of anesthesia    hard to wake up  . Depression   . eczema   . Glaucoma   . Hearing impairment   . Hypertension   . Memory impairment   . Osteoporosis     Past Surgical History:  Procedure Laterality Date  . ABDOMINAL HYSTERECTOMY    . BACK SURGERY     x 3  . DILATION AND CURETTAGE OF UTERUS    . EYE SURGERY     bilateral cataracts with lens implants  . HAND SURGERY     left hand surgery  . TOTAL KNEE ARTHROPLASTY  11/18/2012   Procedure: TOTAL KNEE ARTHROPLASTY;  Surgeon: Loanne Drilling, MD;  Location: [facility];  Service: Orthopedics;  Laterality: Right;     reports that she quit smoking about 2 years ago. Her smoking use included cigarettes. She has a 60.00 pack-year smoking history. She has never used smokeless tobacco. She reports that she does not drink alcohol or use drugs.  Allergies  Allergen Reactions  . Arthritis Pain Regimen [Aspirin] Nausea And Vomiting    Pt states all arthritis medications    Family History  Family history unknown: Yes    Prior to Admission medications   Medication Sig Start Date End Date Taking? Authorizing Provider  amLODipine (NORVASC) 5 MG tablet Take 5 mg by  mouth at bedtime.    Yes [provider]  aspirin EC 81 MG tablet Take 81 mg by mouth daily.   Yes [provider]  Cholecalciferol (VITAMIN D) 2000 units CAPS Take 2,000 Units by mouth daily.   Yes [provider]  dorzolamide-timolol (COSOPT) 22.3-6.8 MG/ML ophthalmic solution Place 1 drop into both eyes 2 (two) times daily.   Yes [provider]  latanoprost (XALATAN) 0.005 % ophthalmic solution Place 1 drop into both eyes at bedtime.   Yes [provider]  metoprolol (LOPRESSOR) 50 MG tablet Take 50 mg by mouth daily.    Yes [provider]    morphine (MSIR) 30 MG tablet Take 30 mg by mouth every 6 (six) hours as needed for moderate pain. Pain   Yes [provider]  promethazine (PHENERGAN) 25 MG tablet Take 25 mg by mouth 4 (four) times daily.   Yes [provider]  sertraline (ZOLOFT) 100 MG tablet Take 150 mg by mouth at bedtime.    Yes [provider]  simvastatin (ZOCOR) 20 MG tablet Take 20 mg by mouth at bedtime.    Yes [provider]  zoledronic acid (RECLAST) 5 MG/100ML SOLN injection Inject 5 mg into the vein once.   Yes [provider]  methocarbamol (ROBAXIN) 500 MG tablet Take 1 tablet (500 mg total) by mouth every 6 (six) hours as needed. Patient not taking: Reported on 10/25/2018 11/20/12   Julien Girt, Alexzandrew L, PA-C  oxyCODONE (OXY IR/ROXICODONE) 5 MG immediate release tablet Take 1-2 tablets (5-10 mg total) by mouth every 3 (three) hours as needed. Patient not taking: Reported on 10/25/2018 11/20/12   Lauraine Rinne, PA-C    Physical Exam: Vitals:   10/25/18 1650 10/25/18 1830 10/25/18 1940 10/25/18 1945  BP: (!) 133/97 137/60  (!) 164/105  Pulse: (!) 59 61  (!) 59  Resp: 18 19  16   Temp:   98.4 F (36.9 C)   TempSrc:   Oral   SpO2: 96% 100%  96%  Weight:      Height:          Constitutional: Moderately built and nourished. Vitals:   10/25/18 1650 10/25/18 1830 10/25/18 1940 10/25/18 1945  BP: (!) 133/97 137/60  (!) 164/105  Pulse: (!) 59 61  (!) 59  Resp: 18 19  16   Temp:   98.4 F (36.9 C)   TempSrc:   Oral   SpO2: 96% 100%  96%  Weight:      Height:       Eyes: Anicteric no pallor. ENMT: No discharge from the ears eyes nose or mouth. Neck: No mass felt.  No neck rigidity.  No JVD appreciated. Respiratory: No rhonchi or crepitations. Cardiovascular: S1-S2 heard no murmurs appreciated. Abdomen: Left flank tenderness. Musculoskeletal: No edema. Skin: No rash. Neurologic: Alert awake oriented to person and place moves all extremities.   No facial asymmetry pupils are equal and reacting to light. Psychiatric: Appears normal per normal affect.  She   Labs on Admission: I have personally reviewed following labs and imaging studies  CBC: Recent Labs  Lab 10/25/18 1505  WBC 10.5  HGB 12.7  HCT 40.7  MCV 88.9  PLT 185   Basic Metabolic Panel: Recent Labs  Lab 10/25/18 1505  NA 139  K 3.9  CL 107  CO2 26  GLUCOSE 132*  BUN 13  CREATININE 0.95  CALCIUM 9.0   GFR: Estimated Creatinine Clearance: 41.7 mL/min (by C-G formula  based on SCr of 0.95 mg/dL). Liver Function Tests: Recent Labs  Lab 10/25/18 1505  AST 22  ALT 15  ALKPHOS 57  BILITOT 0.5  PROT 6.2*  ALBUMIN 3.7   No results for input(s): LIPASE, AMYLASE in the last 168 hours. No results for input(s): AMMONIA in the last 168 hours. Coagulation Profile: No results for input(s): INR, PROTIME in the last 168 hours. Cardiac Enzymes: No results for input(s): CKTOTAL, CKMB, CKMBINDEX, TROPONINI in the last 168 hours. BNP (last 3 results) No results for input(s): PROBNP in the last 8760 hours. HbA1C: No results for input(s): HGBA1C in the last 72 hours. CBG: No results for input(s): GLUCAP in the last 168 hours. Lipid Profile: No results for input(s): CHOL, HDL, LDLCALC, TRIG, CHOLHDL, LDLDIRECT in the last 72 hours. Thyroid Function Tests: No results for input(s): TSH, T4TOTAL, FREET4, T3FREE, THYROIDAB in the last 72 hours. Anemia Panel: No results for input(s): VITAMINB12, FOLATE, FERRITIN, TIBC, IRON, RETICCTPCT in the last 72 hours. Urine analysis:    Component Value Date/Time   COLORURINE YELLOW 10/25/2018 2020   APPEARANCEUR CLEAR 10/25/2018 2020   LABSPEC 1.020 10/25/2018 2020   PHURINE 5.0 10/25/2018 2020   GLUCOSEU NEGATIVE 10/25/2018 2020   HGBUR NEGATIVE 10/25/2018 2020   BILIRUBINUR NEGATIVE 10/25/2018 2020   KETONESUR NEGATIVE 10/25/2018 2020   PROTEINUR NEGATIVE 10/25/2018 2020   UROBILINOGEN 1.0 11/12/2012 1336   NITRITE  NEGATIVE 10/25/2018 2020   LEUKOCYTESUR MODERATE (A) 10/25/2018 2020   Sepsis Labs: @LABRCNTIP (procalcitonin:4,lacticidven:4) )No results found for this or any previous visit (from the past 240 hour(s)).   Radiological Exams on Admission: Dg Chest 2 View  Result Date: 10/25/2018 CLINICAL DATA:  Altered mental status.  Hypoxia.  Fever. EXAM: CHEST - 2 VIEW COMPARISON:  Chest x-ray 11/12/2012.  CT 12/21/2009. FINDINGS: Patient is rotated to the right. Mild fullness of the anterior mediastinum is again noted consistent with prominent anterior mediastinal fat previously noted. Cardiomegaly with normal pulmonary vascularity. Tortuous thoracic aorta. Mild bibasilar subsegmental atelectasis/infiltrate. No focal alveolar infiltrate. Calcified pulmonary nodules noted consistent with prior granulomas disease. No pleural effusion or pneumothorax. Degenerative change thoracic spine and both shoulders. High-riding both shoulders consistent bilateral rotator cuff tears. Prior lumbar fusion. IMPRESSION: Mild bibasilar subsegmental atelectasis/infiltrates. 2. Cardiomegaly. No pulmonary venous congestion. Tortuous thoracic aorta. Electronically Signed   By: Maisie Fus  Register   On: 10/25/2018 15:42    EKG: Independently reviewed.  Normal sinus rhythm.  Assessment/Plan Principal Problem:   CAP (community acquired pneumonia) Active Problems:   Essential hypertension   Dizziness   Hypertensive urgency   Chronic back pain   HLD (hyperlipidemia)    1. Community-acquired pneumonia -patient has fever and chest x-ray showing possible infiltrates and patient was hypoxic patient likely has community-acquired pneumonia.  Could also be from recent vomiting but has not had any vomiting last 3 days.  Patient has been started on ceftriaxone and Zithromax which should be continued and check urine for Legionella strep antigen influenza PCR follow cultures. 2. Nausea vomiting with left flank pain -nausea vomiting as stopped  for last 3 days after Phenergan was started.  On exam patient has left flank tenderness for which CT renal study has been ordered.  3. Acute encephalopathy likely from hypotension and fever patient appears nonfocal.  CT head is pending. 4. Hypertensive urgency -we will continue home medications including metoprolol Norvasc and keep patient on PRN IV hydralazine.  Closely follow blood pressure trends. 5. Dizziness -mostly on standing up.  Will check  orthostatics get physical therapy consult.  CT head is pending. 6. Chronic back pain on morphine which should be continued. 7. Hyperlipidemia on statins.   This patient was hypoxic and also was confused with uncontrolled blood pressure with multiple comorbidities and conditions to be addressed patient will require inpatient status.   DVT prophylaxis: Lovenox. Code Status: Full code. Family Communication: Patient's daughter. Disposition Plan: Home. Consults called: None. Admission status: Inpatient.   Eduard Clos MD [facility] Pager [phone].  If 7PM-7AM, please contact night-coverage [email or web address] Password [facility]  10/25/2018, 9:24 PM

## 2018-10-26 ENCOUNTER — Other Ambulatory Visit: Payer: Self-pay

## 2018-10-26 DIAGNOSIS — I10 Essential (primary) hypertension: Secondary | ICD-10-CM

## 2018-10-26 DIAGNOSIS — G8929 Other chronic pain: Secondary | ICD-10-CM

## 2018-10-26 DIAGNOSIS — E785 Hyperlipidemia, unspecified: Secondary | ICD-10-CM

## 2018-10-26 DIAGNOSIS — M5440 Lumbago with sciatica, unspecified side: Secondary | ICD-10-CM

## 2018-10-26 LAB — BASIC METABOLIC PANEL
Anion gap: 6 (ref 5–15)
BUN: 10 mg/dL (ref 8–23)
CO2: 25 mmol/L (ref 22–32)
Calcium: 8.7 mg/dL — ABNORMAL LOW (ref 8.9–10.3)
Chloride: 108 mmol/L (ref 98–111)
Creatinine, Ser: 0.78 mg/dL (ref 0.44–1.00)
GFR calc Af Amer: >60 mL/min (ref >60)
GFR calc non Af Amer: >60 mL/min (ref >60)
Glucose, Bld: 88 mg/dL (ref 70–99)
Potassium: 3.4 mmol/L — ABNORMAL LOW (ref 3.5–5.1)
Sodium: 139 mmol/L (ref 135–145)

## 2018-10-26 LAB — CBC WITH DIFFERENTIAL/PLATELET
Abs Immature Granulocytes: 0.03 10*3/uL (ref 0.00–0.07)
Basophils Absolute: 0.1 10*3/uL (ref 0.0–0.1)
Basophils Relative: 1 %
Eosinophils Absolute: 0.4 10*3/uL (ref 0.0–0.5)
Eosinophils Relative: 4 %
HCT: 37.6 % (ref 36.0–46.0)
Hemoglobin: 11.9 g/dL — ABNORMAL LOW (ref 12.0–15.0)
Immature Granulocytes: 0 %
Lymphocytes Relative: 28 %
Lymphs Abs: 2.6 10*3/uL (ref 0.7–4.0)
MCH: 27.4 pg (ref 26.0–34.0)
MCHC: 31.6 g/dL (ref 30.0–36.0)
MCV: 86.6 fL (ref 80.0–100.0)
Monocytes Absolute: 1.1 10*3/uL — ABNORMAL HIGH (ref 0.1–1.0)
Monocytes Relative: 12 %
Neutro Abs: 5 10*3/uL (ref 1.7–7.7)
Neutrophils Relative %: 55 %
Platelets: 159 10*3/uL (ref 150–400)
RBC: 4.34 MIL/uL (ref 3.87–5.11)
RDW: 13.3 % (ref 11.5–15.5)
WBC: 9.2 10*3/uL (ref 4.0–10.5)
nRBC: 0 % (ref 0.0–0.2)

## 2018-10-26 LAB — HEPATIC FUNCTION PANEL
ALT: 13 U/L (ref 0–44)
AST: 22 U/L (ref 15–41)
Albumin: 3.3 g/dL — ABNORMAL LOW (ref 3.5–5.0)
Alkaline Phosphatase: 46 U/L (ref 38–126)
Bilirubin, Direct: 0.2 mg/dL (ref 0.0–0.2)
Indirect Bilirubin: 0.6 mg/dL (ref 0.3–0.9)
Total Bilirubin: 0.8 mg/dL (ref 0.3–1.2)
Total Protein: 5.8 g/dL — ABNORMAL LOW (ref 6.5–8.1)

## 2018-10-26 LAB — INFLUENZA PANEL BY PCR (TYPE A & B)
Influenza A By PCR: NEGATIVE
Influenza B By PCR: NEGATIVE

## 2018-10-26 LAB — STREP PNEUMONIAE URINARY ANTIGEN: Strep Pneumo Urinary Antigen: NEGATIVE

## 2018-10-26 LAB — MAGNESIUM: Magnesium: 1.6 mg/dL — ABNORMAL LOW (ref 1.7–2.4)

## 2018-10-26 NOTE — Evaluation (Signed)
Physical Therapy Evaluation Patient Details Name: Cynthia Guzman MRN: [id number] DOB: 11-08-1932 Today's Date: 10/26/2018   History of Present Illness  84 y.o. female with history of chronic back pain, hypertension, dementia and depression brought to the ED for evaluation of confusion, uncontrolled hypertension-found to have fever secondary to possible pneumonia.     Clinical Impression  Pt admitted with above diagnosis. Pt currently with functional limitations due to the deficits listed below (see PT Problem List). On eval, pt required min/min guard assist bed mobility, min guard assist transfers, and HH/min guard assist ambulation 25 feet. Pt will benefit from skilled PT to increase their independence and safety with mobility to allow discharge to the venue listed below.       Follow Up Recommendations Home health PT;Supervision for mobility/OOB    Equipment Recommendations  None recommended by PT    Recommendations for Other Services       Precautions / Restrictions Precautions Precautions: Fall Restrictions Weight Bearing Restrictions: No      Mobility  Bed Mobility Overal bed mobility: Needs Assistance Bed Mobility: Supine to Sit;Sit to Supine     Supine to sit: Min assist;HOB elevated Sit to supine: Min guard;HOB elevated   General bed mobility comments: +rail, increased time and effort  Transfers Overall transfer level: Needs assistance Equipment used: 1 person hand held assist Transfers: Sit to/from [employer] Sit to Stand: Min guard Stand pivot transfers: Min guard       General transfer comment: cues for sequencing and safety  Ambulation/Gait Ambulation/Gait assistance: Min guard Gait Distance (Feet): 25 Feet Assistive device: 1 person hand held assist Gait Pattern/deviations: Step-through pattern;Decreased stride length Gait velocity: decreased Gait velocity interpretation: <1.31 ft/sec, indicative of household ambulator General  Gait Details: mildly unsteady gait, fatigues quickly   Stairs            Wheelchair Mobility    Modified Rankin (Stroke Patients Only)       Balance Overall balance assessment: Needs assistance Sitting-balance support: No upper extremity supported;Feet supported Sitting balance-Leahy Scale: Good     Standing balance support: Single extremity supported;During functional activity Standing balance-Leahy Scale: Fair                               Pertinent Vitals/Pain Pain Assessment: Faces Faces Pain Scale: Hurts little more Pain Location: R flank Pain Descriptors / Indicators: Sore;Spasm Pain Intervention(s): Monitored during session    Home Living Family/patient expects to be discharged to:: Private residence Living Arrangements: Children Available Help at Discharge: Available 24 hours/day Type of Home: House Home Access: Level entry     Home Layout: One level Home Equipment: Bedside commode;Walker - 2 wheels;Cane - single point      Prior Function Level of Independence: Needs assistance   Gait / Transfers Assistance Needed: ambulates without AD vs SPC  ADL's / Homemaking Assistance Needed: independent bADLs, daughter assists with iADLs        Hand Dominance   Dominant Hand: Right    Extremity/Trunk Assessment   Upper Extremity Assessment Upper Extremity Assessment: Generalized weakness    Lower Extremity Assessment Lower Extremity Assessment: Generalized weakness    Cervical / Trunk Assessment Cervical / Trunk Assessment: Kyphotic  Communication   Communication: HOH  Cognition Arousal/Alertness: Awake/alert Behavior During Therapy: WFL for tasks assessed/performed Overall Cognitive Status: Within Functional Limits for tasks assessed  General Comments: Daughter present in room. Reports she feels pt back to baseline cognitively.      General Comments      Exercises      Assessment/Plan    PT Assessment Patient needs continued PT services  PT Problem List Decreased strength;Decreased mobility;Decreased activity tolerance;Decreased balance       PT Treatment Interventions DME instruction;Therapeutic activities;Gait training;Therapeutic exercise;Patient/family education;Balance training;Functional mobility training    PT Goals (Current goals can be found in the Care Plan section)  Acute Rehab PT Goals Patient Stated Goal: home PT Goal Formulation: With patient/family Time For Goal Achievement: 11/09/18 Potential to Achieve Goals: Good    Frequency Min 3X/week   Barriers to discharge        Co-evaluation               AM-PAC PT "6 Clicks" Daily Activity  Outcome Measure Difficulty turning over in bed (including adjusting bedclothes, sheets and blankets)?: A Little Difficulty moving from lying on back to sitting on the side of the bed? : A Lot Difficulty sitting down on and standing up from a chair with arms (e.g., wheelchair, bedside commode, etc,.)?: A Little Help needed moving to and from a bed to chair (including a wheelchair)?: A Little Help needed walking in hospital room?: A Little Help needed climbing 3-5 steps with a railing? : A Lot 6 Click Score: 16    End of Session Equipment Utilized During Treatment: Gait belt Activity Tolerance: Patient tolerated treatment well Patient left: in bed;with call bell/phone within reach;with bed alarm set;with family/visitor present Nurse Communication: Mobility status PT Visit Diagnosis: Unsteadiness on feet (R26.81);Muscle weakness (generalized) (M62.81)    Time: 1610-9604 PT Time Calculation (min) (ACUTE ONLY): 28 min   Charges:   PT Evaluation $PT Eval Low Complexity: 1 Low PT Treatments $Therapeutic Activity: 8-22 mins        Aida Raider, PT  Office # [phone] Pager [phone]   Ilda Foil 10/26/2018, 12:25 PM

## 2018-10-26 NOTE — Progress Notes (Signed)
PROGRESS NOTE        PATIENT DETAILS Name: Cynthia Guzman Age: 84 y.o. Sex: female Date of Birth: 1932/09/29 Admit Date: 10/25/2018 Admitting Physician Eduard Clos, MD ZOX:[id number], Adelfa Koh, MD  Brief Narrative: Patient is a 84 y.o. female with history of chronic back pain, hypertension, dementia and depression brought to the ED for evaluation of confusion, uncontrolled hypertension-found to have fever secondary to possible pneumonia.  See below for further details.  Subjective: Hard of hearing but when spoken loud-seems to follow all my commands appropriately.  Answers most of my questions as well.  Denies any shortness of breath or cough.  Afebrile overnight.  Assessment/[facility]-acquired pneumonia: Afebrile since admission, no leukocytosis-appears to have improved clinically-continue Rocephin/Zithromax and await culture data.  Acute metabolic encephalopathy: Secondary to pneumonia and uncontrolled hypertension.  Although hard of hearing-seems to have improved-not sure if she is back to her baseline-we will await arrival of family.  CT head without acute abnormalities.  Hopefully she will continue to improve with better control of her blood pressure and treatment of pneumonia.  Follow.  Nausea with vomiting: Etiology unclear-seems to have resolved with supportive care.  Follow.  Left flank pain: Denies any flank pain today-CT of the abdomen showed possible ureteral stone versus vascular calcification-supportive care for now-follow.  Hypertension: Uncontrolled on admission-better controlled this morning-continue metoprolol and Norvasc.  Follow closely over the next few days and adjust accordingly.  Dyslipidemia: Continue statin  Chronic back pain: Continue MSIR  DVT Prophylaxis: Prophylactic Lovenox  Code Status: Full code  Family Communication: None at bedside  Disposition Plan: Remain inpatient-Home in the next few days if  clinically better  Antimicrobial agents: Anti-infectives (From admission, onward)   Start     Dose/Rate Route Frequency Ordered Stop   10/26/18 1600  cefTRIAXone (ROCEPHIN) 1 g in sodium chloride 0.9 % 100 mL IVPB     1 g 200 mL/hr over 30 Minutes Intravenous Every 24 hours 10/25/18 2124 11/02/18 1559   10/26/18 1600  azithromycin (ZITHROMAX) 500 mg in sodium chloride 0.9 % 250 mL IVPB     500 mg 250 mL/hr over 60 Minutes Intravenous Every 24 hours 10/25/18 2124 11/02/18 1559   10/25/18 1845  cefTRIAXone (ROCEPHIN) 1 g in sodium chloride 0.9 % 100 mL IVPB     1 g 200 mL/hr over 30 Minutes Intravenous  Once 10/25/18 1844 10/25/18 2057   10/25/18 1845  azithromycin (ZITHROMAX) 500 mg in sodium chloride 0.9 % 250 mL IVPB     500 mg 250 mL/hr over 60 Minutes Intravenous  Once 10/25/18 1844 10/25/18 2054      Procedures: None  CONSULTS:  None  Time spent: 25- minutes-Greater than 50% of this time was spent in counseling, explanation of diagnosis, planning of further management, and coordination of care.  MEDICATIONS: Scheduled Meds: . amLODipine  5 mg Oral QHS  . aspirin EC  81 mg Oral Daily  . dorzolamide-timolol  1 drop Both Eyes BID  . enoxaparin (LOVENOX) injection  40 mg Subcutaneous QHS  . latanoprost  1 drop Both Eyes QHS  . metoprolol tartrate  50 mg Oral Daily  . sertraline  150 mg Oral QHS  . simvastatin  20 mg Oral QHS   Continuous Infusions: . sodium chloride 75 mL/hr at 10/25/18 2346  . azithromycin    . cefTRIAXone (ROCEPHIN)  IV     PRN Meds:.acetaminophen **OR** acetaminophen, hydrALAZINE, morphine, ondansetron **OR** ondansetron (ZOFRAN) IV   PHYSICAL EXAM: Vital signs: Vitals:   10/26/18 0104 10/26/18 0451 10/26/18 0530 10/26/18 0836  BP: (!) 156/95 (!) 202/72 (!) 156/74 (!) 168/76  Pulse: 68 60    Resp:  16    Temp:  98.7 F (37.1 C)    TempSrc:      SpO2:  96%    Weight:      Height:       Filed Weights   10/25/18 1435  Weight: 68.5 kg    Body mass index is 26.75 kg/m.   General appearance :Awake, alert, not in any distress. Speech Clear. Eyes:Pink conjunctiva HEENT: Atraumatic and Normocephalic Neck: supple, no JVD.  Resp:Good air entry bilaterally, no added sounds  CVS: S1 S2 regular, no murmurs.  GI: Bowel sounds present, Non tender and not distended with no gaurding, rigidity or rebound.No organomegaly Extremities: B/L Lower Ext shows no edema, both legs are warm to touch Neurology: Non focal Musculoskeletal:No digital cyanosis Skin:No Rash, warm and dry Wounds:N/A  I have personally reviewed following labs and imaging studies  LABORATORY DATA: CBC: Recent Labs  Lab 10/25/18 1505 10/25/18 2218 10/26/18 0230  WBC 10.5 8.6 9.2  NEUTROABS  --   --  5.0  HGB 12.7 12.0 11.9*  HCT 40.7 38.7 37.6  MCV 88.9 87.6 86.6  PLT 185 168 159    Basic Metabolic Panel: Recent Labs  Lab 10/25/18 1505 10/25/18 2218 10/26/18 0230  NA 139  --  139  K 3.9  --  3.4*  CL 107  --  108  CO2 26  --  25  GLUCOSE 132*  --  88  BUN 13  --  10  CREATININE 0.95 0.79 0.78  CALCIUM 9.0  --  8.7*  MG  --   --  1.6*    GFR: Estimated Creatinine Clearance: 49.5 mL/min (by C-G formula based on SCr of 0.78 mg/dL).  Liver Function Tests: Recent Labs  Lab 10/25/18 1505 10/26/18 0230  AST 22 22  ALT 15 13  ALKPHOS 57 46  BILITOT 0.5 0.8  PROT 6.2* 5.8*  ALBUMIN 3.7 3.3*   No results for input(s): LIPASE, AMYLASE in the last 168 hours. No results for input(s): AMMONIA in the last 168 hours.  Coagulation Profile: No results for input(s): INR, PROTIME in the last 168 hours.  Cardiac Enzymes: No results for input(s): CKTOTAL, CKMB, CKMBINDEX, TROPONINI in the last 168 hours.  BNP (last 3 results) No results for input(s): PROBNP in the last 8760 hours.  HbA1C: No results for input(s): HGBA1C in the last 72 hours.  CBG: No results for input(s): GLUCAP in the last 168 hours.  Lipid Profile: No results for  input(s): CHOL, HDL, LDLCALC, TRIG, CHOLHDL, LDLDIRECT in the last 72 hours.  Thyroid Function Tests: No results for input(s): TSH, T4TOTAL, FREET4, T3FREE, THYROIDAB in the last 72 hours.  Anemia Panel: No results for input(s): VITAMINB12, FOLATE, FERRITIN, TIBC, IRON, RETICCTPCT in the last 72 hours.  Urine analysis:    Component Value Date/Time   COLORURINE YELLOW 10/25/2018 2020   APPEARANCEUR CLEAR 10/25/2018 2020   LABSPEC 1.020 10/25/2018 2020   PHURINE 5.0 10/25/2018 2020   GLUCOSEU NEGATIVE 10/25/2018 2020   HGBUR NEGATIVE 10/25/2018 2020   BILIRUBINUR NEGATIVE 10/25/2018 2020   KETONESUR NEGATIVE 10/25/2018 2020   PROTEINUR NEGATIVE 10/25/2018 2020   UROBILINOGEN 1.0 11/12/2012 1336   NITRITE NEGATIVE 10/25/2018  2020   LEUKOCYTESUR MODERATE (A) 10/25/2018 2020    Sepsis Labs: Lactic Acid, Venous No results found for: LATICACIDVEN  MICROBIOLOGY: Recent Results (from the past 240 hour(s))  Culture, blood (routine x 2)     Status: None (Preliminary result)   Collection Time: 10/25/18  7:11 PM  Result Value Ref Range Status   Specimen Description BLOOD BLOOD RIGHT FOREARM  Final   Special Requests   Final    BOTTLES DRAWN AEROBIC AND ANAEROBIC Blood Culture adequate volume   Culture   Final    NO GROWTH < 12 HOURS Performed at [facility], [address], [place], [place] [address]    Report Status PENDING  Incomplete  Culture, blood (routine x 2)     Status: None (Preliminary result)   Collection Time: 10/25/18  7:11 PM  Result Value Ref Range Status   Specimen Description BLOOD BLOOD LEFT FOREARM  Final   Special Requests   Final    BOTTLES DRAWN AEROBIC AND ANAEROBIC Blood Culture adequate volume   Culture   Final    NO GROWTH < 12 HOURS Performed at [facility], [address], [place], [place] [address]    Report Status PENDING  Incomplete    RADIOLOGY STUDIES/RESULTS: Dg Chest 2 View  Result Date: 10/25/2018 CLINICAL DATA:  Altered  mental status.  Hypoxia.  Fever. EXAM: CHEST - 2 VIEW COMPARISON:  Chest x-ray 11/12/2012.  CT 12/21/2009. FINDINGS: Patient is rotated to the right. Mild fullness of the anterior mediastinum is again noted consistent with prominent anterior mediastinal fat previously noted. Cardiomegaly with normal pulmonary vascularity. Tortuous thoracic aorta. Mild bibasilar subsegmental atelectasis/infiltrate. No focal alveolar infiltrate. Calcified pulmonary nodules noted consistent with prior granulomas disease. No pleural effusion or pneumothorax. Degenerative change thoracic spine and both shoulders. High-riding both shoulders consistent bilateral rotator cuff tears. Prior lumbar fusion. IMPRESSION: Mild bibasilar subsegmental atelectasis/infiltrates. 2. Cardiomegaly. No pulmonary venous congestion. Tortuous thoracic aorta. Electronically Signed   By: Maisie Fus  Register   On: 10/25/2018 15:42   Ct Head Wo Contrast  Result Date: 10/25/2018 CLINICAL DATA:  Altered mental status and fever. EXAM: CT HEAD WITHOUT CONTRAST TECHNIQUE: Contiguous axial images were obtained from the base of the skull through the vertex without intravenous contrast. COMPARISON:  01/05/2014 FINDINGS: Brain: No evidence of acute infarction, hemorrhage, hydrocephalus, extra-axial collection or mass lesion/mass effect. Moderate brain parenchymal volume loss and marked deep white matter microangiopathy. Vascular: Calcific atherosclerotic disease at the skull base. Skull: Normal. Negative for fracture or focal lesion. Sinuses/Orbits: No acute finding. Other: None. IMPRESSION: No acute intracranial abnormality. Moderate brain parenchymal volume loss and marked chronic microvascular disease. Electronically Signed   By: Ted Mcalpine M.D.   On: 10/25/2018 21:32   Ct Renal Stone Study  Result Date: 10/25/2018 CLINICAL DATA:  84 year old female with flank pain. EXAM: CT ABDOMEN AND PELVIS WITHOUT CONTRAST TECHNIQUE: Multidetector CT imaging of the  abdomen and pelvis was performed following the standard protocol without IV contrast. COMPARISON:  None. FINDINGS: Evaluation of this exam is limited in the absence of intravenous contrast. Lower chest: Minimal bibasilar dependent atelectatic changes. The visualized lung bases are otherwise clear. There is coronary vascular calcification. No intra-abdominal free air or free fluid. Hepatobiliary: No focal liver abnormality is seen. No gallstones, gallbladder wall thickening, or biliary dilatation. Pancreas: Moderate fatty infiltration/atrophy of the pancreas. No active inflammatory changes. Spleen: Normal in size without focal abnormality. Adrenals/Urinary Tract: The adrenal glands are unremarkable. There is a  4 mm nonobstructing right renal upper pole calculus. Evaluation of the right ureter is limited due to suboptimal visualization. A 3 mm focus of calcification along the course of the distal right ureter at the crossing over the right external iliac artery (series 3, image 56 and coronal series 6, image 53) is indeterminate. Although this may represent atherosclerotic calcification of the iliac artery, a stone is not excluded. There is mild fullness of the right renal collecting system. Mild atrophy of the left kidney with irregularity and scarring of the left renal cortex likely related to chronic infection. There is no hydronephrosis or nephrolithiasis on the left. Multiple pelvic phleboliths noted. The urinary bladder is unremarkable. Stomach/Bowel: There is moderate stool throughout the colon. There is no bowel obstruction or active inflammation. The appendix is not visualized with certainty. No inflammatory changes identified in the right lower quadrant. Vascular/Lymphatic: Advanced aortoiliac atherosclerotic disease. No portal venous gas. There is no adenopathy. Reproductive: Hysterectomy. No pelvic mass. Other: None Musculoskeletal: Degenerative changes of the spine. Grade 1 L4-L5 anterolisthesis and  posterior fixation hardware. No acute osseous pathology. IMPRESSION: 1. Vascular calcification versus less likely a 3 mm distal right ureteral calculus. There is mild right hydronephrosis and a 4 mm nonobstructing right renal upper pole calculus. 2. No bowel obstruction or active inflammation. Moderate colonic stool burden. Electronically Signed   By: Elgie Collard M.D.   On: 10/25/2018 21:38     LOS: 1 day   Jeoffrey Massed, MD  [facility]  If 7PM-7AM, please contact night-coverage  Please page via [email or web address]-click on MD name and type text message  10/26/2018, 11:00 AM

## 2018-10-27 LAB — URINE CULTURE: Culture: 20000 — AB

## 2018-10-27 LAB — C DIFFICILE QUICK SCREEN W PCR REFLEX
C Diff antigen: NEGATIVE
C Diff interpretation: NOT DETECTED
C Diff toxin: NEGATIVE

## 2018-10-27 LAB — LEGIONELLA PNEUMOPHILA SEROGP 1 UR AG: L. pneumophila Serogp 1 Ur Ag: NEGATIVE

## 2018-10-27 MED ORDER — METOPROLOL TARTRATE 50 MG PO TABS
75.0000 mg | ORAL_TABLET | Freq: Every day | ORAL | Status: DC
  Administered 2018-10-27: 50 mg via ORAL
  Filled 2018-10-27: qty 1

## 2018-10-27 MED ORDER — MAGNESIUM SULFATE 2 GM/50ML IV SOLN
2.0000 g | Freq: Once | INTRAVENOUS | Status: AC
  Administered 2018-10-27: 2 g via INTRAVENOUS
  Filled 2018-10-27: qty 50

## 2018-10-27 MED ORDER — LOPERAMIDE HCL 2 MG PO CAPS: 2.0000 mg | ORAL_CAPSULE | ORAL | Status: DC | PRN

## 2018-10-27 MED ORDER — CEFDINIR 300 MG PO CAPS: 300.0000 mg | ORAL_CAPSULE | Freq: Two times a day (BID) | ORAL | 0 refills | Status: AC

## 2018-10-27 MED ORDER — AMLODIPINE BESYLATE 5 MG PO TABS: 5.0000 mg | ORAL_TABLET | Freq: Every day | ORAL | 0 refills | Status: DC

## 2018-10-27 MED ORDER — AMLODIPINE BESYLATE 10 MG PO TABS: 10.0000 mg | ORAL_TABLET | Freq: Every day | ORAL | Status: DC

## 2018-10-27 MED ORDER — POTASSIUM CHLORIDE CRYS ER 20 MEQ PO TBCR
40.0000 meq | EXTENDED_RELEASE_TABLET | Freq: Once | ORAL | Status: AC
  Administered 2018-10-27: 40 meq via ORAL
  Filled 2018-10-27: qty 2

## 2018-10-27 NOTE — Discharge Summary (Addendum)
PATIENT DETAILS Name: Cynthia Guzman Age: 84 y.o. Sex: female Date of Birth: 27-Jan-1932 MRN: [id number]. Admitting Physician: Eduard Clos, MD [id number], Adelfa Koh, MD  Admit Date: 10/25/2018 Discharge date: 10/27/2018  Recommendations for Outpatient Follow-up:  1. Follow up with PCP in 1-2 weeks 2. Please obtain BMP/CBC in one week 3. Optimize blood pressure regimen 4. Please follow up on the following pending results: Follow blood/urine culture results until final  Admitted From:  Home  Disposition: Home with home health services   Home Health: Yes  Equipment/Devices: None  Discharge Condition: Stable  CODE STATUS: FULL CODE  Diet recommendation:  Heart Healthy   Brief Summary: See H&P, Labs, Consult and Test reports for all details in brief, Patient is a 84 y.o. female with history of chronic back pain, hypertension, dementia and depression brought to the ED for evaluation of confusion, uncontrolled hypertension-found to have fever secondary to possible pneumonia.  See below for further details.  Brief Hospital Course: Community-acquired pneumonia:  Clinically improved-afebrile since admission-no leukocytosis-does not look acutely ill-managed with Rocephin and Zithromax-blood cultures negative-we will transition to [facility] on discharge.  Plans a PCR negative.   Acute metabolic encephalopathy: Secondary to pneumonia and uncontrolled hypertension.  Although hard of hearing-seems to have improved-and I suspect she is now back to her baseline.  CT of the head did not show any acute abnormalities.    Nausea with vomiting: Etiology unclear-seems to have resolved with supportive care.  Follow.  Constipation: Resolved-moderate stool burden was seen on CT scan of the abdomen-she in fact had numerous loose BMs last night-no BM this morning.  Stool C. difficile PCR was negative.  Left flank pain: Denies any flank pain this morning-there is no CVA tenderness.   CT of the abdomen did show a small nonobstructive 4 mm stone in the right renal upper pole.  CT of the abdomen also showed a vascular calcification or a distal ureteral stone-even if this is a stone-this should pass spontaneously.  She has no flank pain on my exam today.  Her abdominal exam is completely benign.  Hypertension: Tends to fluctuate-but overall better-increased amlodipine to 10 mg, continue metoprolol at its current dose.  Follow with PCP and adjust accordingly.    Dyslipidemia: Continue statin  Chronic back pain: Continue MSIR  Procedures/Studies: None  Discharge Diagnoses:  Principal Problem:   CAP (community acquired pneumonia) Active Problems:   Essential hypertension   Dizziness   Hypertensive urgency   Chronic back pain   HLD (hyperlipidemia)   Discharge Instructions:  Activity:  As tolerated with Full fall precautions use walker/cane & assistance as needed  Discharge Instructions    Call MD for:  persistant nausea and vomiting   Complete by:  As directed    Call MD for:  severe uncontrolled pain   Complete by:  As directed    Diet - low sodium heart healthy   Complete by:  As directed    Discharge instructions   Complete by:  As directed    Follow with Primary MD  Tisovec, Adelfa Koh, MD in 1 week  Please ask your primary care practitioner to follow blood and urine cultures until final  Please get a complete blood count and chemistry panel checked by your Primary MD at your next visit, and again as instructed by your Primary MD.  Get Medicines reviewed and adjusted: Please take all your medications with you for your next visit with your Primary MD  Laboratory/radiological data: Please request  your Primary MD to go over all hospital tests and procedure/radiological results at the follow up, please ask your Primary MD to get all Hospital records sent to his/her office.  In some cases, they will be blood work, cultures and biopsy results pending at  the time of your discharge. Please request that your primary care M.D. follows up on these results.  Also Note the following: If you experience worsening of your admission symptoms, develop shortness of breath, life threatening emergency, suicidal or homicidal thoughts you must seek medical attention immediately by calling 911 or calling your MD immediately  if symptoms less severe.  You must read complete instructions/literature along with all the possible adverse reactions/side effects for all the Medicines you take and that have been prescribed to you. Take any new Medicines after you have completely understood and accpet all the possible adverse reactions/side effects.   Do not drive when taking Pain medications or sleeping medications (Benzodaizepines)  Do not take more than prescribed Pain, Sleep and Anxiety Medications. It is not advisable to combine anxiety,sleep and pain medications without talking with your primary care practitioner  Special Instructions: If you have smoked or chewed Tobacco  in the last 2 yrs please stop smoking, stop any regular Alcohol  and or any Recreational drug use.  Wear Seat belts while driving.  Please note: You were cared for by a hospitalist during your hospital stay. Once you are discharged, your primary care physician will handle any further medical issues. Please note that NO REFILLS for any discharge medications will be authorized once you are discharged, as it is imperative that you return to your primary care physician (or establish a relationship with a primary care physician if you do not have one) for your post hospital discharge needs so that they can reassess your need for medications and monitor your lab values.   Increase activity slowly   Complete by:  As directed      Allergies as of 10/27/2018      Reactions   Arthritis Pain Regimen [aspirin] Nausea And Vomiting   Pt states all arthritis medications      Medication List    TAKE these  medications   amLODipine 5 MG tablet Commonly known as:  NORVASC Take 1 tablet (5 mg total) by mouth at bedtime.   aspirin EC 81 MG tablet Take 81 mg by mouth daily.   cefdinir 300 MG capsule Commonly known as:  OMNICEF Take 1 capsule (300 mg total) by mouth 2 (two) times daily for 3 days.   dorzolamide-timolol 22.3-6.8 MG/ML ophthalmic solution Commonly known as:  COSOPT Place 1 drop into both eyes 2 (two) times daily.   latanoprost 0.005 % ophthalmic solution Commonly known as:  XALATAN Place 1 drop into both eyes at bedtime.   methocarbamol 500 MG tablet Commonly known as:  ROBAXIN Take 1 tablet (500 mg total) by mouth every 6 (six) hours as needed.   metoprolol tartrate 50 MG tablet Commonly known as:  LOPRESSOR Take 50 mg by mouth daily.   morphine 30 MG tablet Commonly known as:  MSIR Take 30 mg by mouth every 6 (six) hours as needed for moderate pain. Pain   oxyCODONE 5 MG immediate release tablet Commonly known as:  Oxy IR/ROXICODONE Take 1-2 tablets (5-10 mg total) by mouth every 3 (three) hours as needed.   promethazine 25 MG tablet Commonly known as:  PHENERGAN Take 25 mg by mouth 4 (four) times daily.   sertraline 100 MG  tablet Commonly known as:  ZOLOFT Take 150 mg by mouth at bedtime.   simvastatin 20 MG tablet Commonly known as:  ZOCOR Take 20 mg by mouth at bedtime.   Vitamin D 2000 units Caps Take 2,000 Units by mouth daily.   zoledronic acid 5 MG/100ML Soln injection Commonly known as:  RECLAST Inject 5 mg into the vein once.      Follow-up Information    Tisovec, Adelfa Koh, MD. Schedule an appointment as soon as possible for a visit in 1 week(s).   Specialty:  Internal Medicine Contact information: [address] [place] [address] [phone]          Allergies  Allergen Reactions  . Arthritis Pain Regimen [Aspirin] Nausea And Vomiting    Pt states all arthritis medications    Consultations:   None  Other  Procedures/Studies: Dg Chest 2 View  Result Date: 10/25/2018 CLINICAL DATA:  Altered mental status.  Hypoxia.  Fever. EXAM: CHEST - 2 VIEW COMPARISON:  Chest x-ray 11/12/2012.  CT 12/21/2009. FINDINGS: Patient is rotated to the right. Mild fullness of the anterior mediastinum is again noted consistent with prominent anterior mediastinal fat previously noted. Cardiomegaly with normal pulmonary vascularity. Tortuous thoracic aorta. Mild bibasilar subsegmental atelectasis/infiltrate. No focal alveolar infiltrate. Calcified pulmonary nodules noted consistent with prior granulomas disease. No pleural effusion or pneumothorax. Degenerative change thoracic spine and both shoulders. High-riding both shoulders consistent bilateral rotator cuff tears. Prior lumbar fusion. IMPRESSION: Mild bibasilar subsegmental atelectasis/infiltrates. 2. Cardiomegaly. No pulmonary venous congestion. Tortuous thoracic aorta. Electronically Signed   By: Maisie Fus  Register   On: 10/25/2018 15:42   Ct Head Wo Contrast  Result Date: 10/25/2018 CLINICAL DATA:  Altered mental status and fever. EXAM: CT HEAD WITHOUT CONTRAST TECHNIQUE: Contiguous axial images were obtained from the base of the skull through the vertex without intravenous contrast. COMPARISON:  01/05/2014 FINDINGS: Brain: No evidence of acute infarction, hemorrhage, hydrocephalus, extra-axial collection or mass lesion/mass effect. Moderate brain parenchymal volume loss and marked deep white matter microangiopathy. Vascular: Calcific atherosclerotic disease at the skull base. Skull: Normal. Negative for fracture or focal lesion. Sinuses/Orbits: No acute finding. Other: None. IMPRESSION: No acute intracranial abnormality. Moderate brain parenchymal volume loss and marked chronic microvascular disease. Electronically Signed   By: Ted Mcalpine M.D.   On: 10/25/2018 21:32   Ct Renal Stone Study  Result Date: 10/25/2018 CLINICAL DATA:  84 year old female with flank pain.  EXAM: CT ABDOMEN AND PELVIS WITHOUT CONTRAST TECHNIQUE: Multidetector CT imaging of the abdomen and pelvis was performed following the standard protocol without IV contrast. COMPARISON:  None. FINDINGS: Evaluation of this exam is limited in the absence of intravenous contrast. Lower chest: Minimal bibasilar dependent atelectatic changes. The visualized lung bases are otherwise clear. There is coronary vascular calcification. No intra-abdominal free air or free fluid. Hepatobiliary: No focal liver abnormality is seen. No gallstones, gallbladder wall thickening, or biliary dilatation. Pancreas: Moderate fatty infiltration/atrophy of the pancreas. No active inflammatory changes. Spleen: Normal in size without focal abnormality. Adrenals/Urinary Tract: The adrenal glands are unremarkable. There is a 4 mm nonobstructing right renal upper pole calculus. Evaluation of the right ureter is limited due to suboptimal visualization. A 3 mm focus of calcification along the course of the distal right ureter at the crossing over the right external iliac artery (series 3, image 56 and coronal series 6, image 53) is indeterminate. Although this may represent atherosclerotic calcification of the iliac artery, a stone is not excluded. There is mild fullness  of the right renal collecting system. Mild atrophy of the left kidney with irregularity and scarring of the left renal cortex likely related to chronic infection. There is no hydronephrosis or nephrolithiasis on the left. Multiple pelvic phleboliths noted. The urinary bladder is unremarkable. Stomach/Bowel: There is moderate stool throughout the colon. There is no bowel obstruction or active inflammation. The appendix is not visualized with certainty. No inflammatory changes identified in the right lower quadrant. Vascular/Lymphatic: Advanced aortoiliac atherosclerotic disease. No portal venous gas. There is no adenopathy. Reproductive: Hysterectomy. No pelvic mass. Other: None  Musculoskeletal: Degenerative changes of the spine. Grade 1 L4-L5 anterolisthesis and posterior fixation hardware. No acute osseous pathology. IMPRESSION: 1. Vascular calcification versus less likely a 3 mm distal right ureteral calculus. There is mild right hydronephrosis and a 4 mm nonobstructing right renal upper pole calculus. 2. No bowel obstruction or active inflammation. Moderate colonic stool burden. Electronically Signed   By: Elgie Collard M.D.   On: 10/25/2018 21:38      TODAY-DAY OF DISCHARGE:  Subjective:   Cynthia Guzman today has no headache,no chest abdominal pain,no new weakness tingling or numbness, feels much better wants to go home today  Objective:   Blood pressure 138/74, pulse 63, temperature 98.3 F (36.8 C), temperature source Oral, resp. rate 16, height 5\' 3"  (1.6 m), weight 68.5 kg, SpO2 97 %.  Intake/Output Summary (Last 24 hours) at 10/27/2018 0923 Last data filed at 10/27/2018 0300 Gross per 24 hour  Intake 1066.38 ml  Output -  Net 1066.38 ml   Filed Weights   10/25/18 1435  Weight: 68.5 kg    Exam: Awake Alert, Oriented *3, No new F.N deficits, Normal affect [place].AT,PERRAL Supple Neck,No JVD, No cervical lymphadenopathy appriciated.  Symmetrical Chest wall movement, Good air movement bilaterally, CTAB RRR,No Gallops,Rubs or new Murmurs, No Parasternal Heave +ve B.Sounds, Abd Soft, Non tender, No organomegaly appriciated, No rebound -guarding or rigidity. No Cyanosis, Clubbing or edema, No new Rash or bruise   PERTINENT RADIOLOGIC STUDIES: Dg Chest 2 View  Result Date: 10/25/2018 CLINICAL DATA:  Altered mental status.  Hypoxia.  Fever. EXAM: CHEST - 2 VIEW COMPARISON:  Chest x-ray 11/12/2012.  CT 12/21/2009. FINDINGS: Patient is rotated to the right. Mild fullness of the anterior mediastinum is again noted consistent with prominent anterior mediastinal fat previously noted. Cardiomegaly with normal pulmonary vascularity. Tortuous thoracic  aorta. Mild bibasilar subsegmental atelectasis/infiltrate. No focal alveolar infiltrate. Calcified pulmonary nodules noted consistent with prior granulomas disease. No pleural effusion or pneumothorax. Degenerative change thoracic spine and both shoulders. High-riding both shoulders consistent bilateral rotator cuff tears. Prior lumbar fusion. IMPRESSION: Mild bibasilar subsegmental atelectasis/infiltrates. 2. Cardiomegaly. No pulmonary venous congestion. Tortuous thoracic aorta. Electronically Signed   By: Maisie Fus  Register   On: 10/25/2018 15:42   Ct Head Wo Contrast  Result Date: 10/25/2018 CLINICAL DATA:  Altered mental status and fever. EXAM: CT HEAD WITHOUT CONTRAST TECHNIQUE: Contiguous axial images were obtained from the base of the skull through the vertex without intravenous contrast. COMPARISON:  01/05/2014 FINDINGS: Brain: No evidence of acute infarction, hemorrhage, hydrocephalus, extra-axial collection or mass lesion/mass effect. Moderate brain parenchymal volume loss and marked deep white matter microangiopathy. Vascular: Calcific atherosclerotic disease at the skull base. Skull: Normal. Negative for fracture or focal lesion. Sinuses/Orbits: No acute finding. Other: None. IMPRESSION: No acute intracranial abnormality. Moderate brain parenchymal volume loss and marked chronic microvascular disease. Electronically Signed   By: Ted Mcalpine M.D.   On: 10/25/2018 21:32   Ct  Renal Stone Study  Result Date: 10/25/2018 CLINICAL DATA:  84 year old female with flank pain. EXAM: CT ABDOMEN AND PELVIS WITHOUT CONTRAST TECHNIQUE: Multidetector CT imaging of the abdomen and pelvis was performed following the standard protocol without IV contrast. COMPARISON:  None. FINDINGS: Evaluation of this exam is limited in the absence of intravenous contrast. Lower chest: Minimal bibasilar dependent atelectatic changes. The visualized lung bases are otherwise clear. There is coronary vascular calcification. No  intra-abdominal free air or free fluid. Hepatobiliary: No focal liver abnormality is seen. No gallstones, gallbladder wall thickening, or biliary dilatation. Pancreas: Moderate fatty infiltration/atrophy of the pancreas. No active inflammatory changes. Spleen: Normal in size without focal abnormality. Adrenals/Urinary Tract: The adrenal glands are unremarkable. There is a 4 mm nonobstructing right renal upper pole calculus. Evaluation of the right ureter is limited due to suboptimal visualization. A 3 mm focus of calcification along the course of the distal right ureter at the crossing over the right external iliac artery (series 3, image 56 and coronal series 6, image 53) is indeterminate. Although this may represent atherosclerotic calcification of the iliac artery, a stone is not excluded. There is mild fullness of the right renal collecting system. Mild atrophy of the left kidney with irregularity and scarring of the left renal cortex likely related to chronic infection. There is no hydronephrosis or nephrolithiasis on the left. Multiple pelvic phleboliths noted. The urinary bladder is unremarkable. Stomach/Bowel: There is moderate stool throughout the colon. There is no bowel obstruction or active inflammation. The appendix is not visualized with certainty. No inflammatory changes identified in the right lower quadrant. Vascular/Lymphatic: Advanced aortoiliac atherosclerotic disease. No portal venous gas. There is no adenopathy. Reproductive: Hysterectomy. No pelvic mass. Other: None Musculoskeletal: Degenerative changes of the spine. Grade 1 L4-L5 anterolisthesis and posterior fixation hardware. No acute osseous pathology. IMPRESSION: 1. Vascular calcification versus less likely a 3 mm distal right ureteral calculus. There is mild right hydronephrosis and a 4 mm nonobstructing right renal upper pole calculus. 2. No bowel obstruction or active inflammation. Moderate colonic stool burden. Electronically Signed    By: Elgie Collard M.D.   On: 10/25/2018 21:38     PERTINENT LAB RESULTS: CBC: Recent Labs    10/25/18 2218 10/26/18 0230  WBC 8.6 9.2  HGB 12.0 11.9*  HCT 38.7 37.6  PLT 168 159   CMET CMP     Component Value Date/Time   NA 139 10/26/2018 0230   K 3.4 (L) 10/26/2018 0230   CL 108 10/26/2018 0230   CO2 25 10/26/2018 0230   GLUCOSE 88 10/26/2018 0230   BUN 10 10/26/2018 0230   CREATININE 0.78 10/26/2018 0230   CALCIUM 8.7 (L) 10/26/2018 0230   PROT 5.8 (L) 10/26/2018 0230   ALBUMIN 3.3 (L) 10/26/2018 0230   AST 22 10/26/2018 0230   ALT 13 10/26/2018 0230   ALKPHOS 46 10/26/2018 0230   BILITOT 0.8 10/26/2018 0230   GFRNONAA >60 10/26/2018 0230   GFRAA >60 10/26/2018 0230    GFR Estimated Creatinine Clearance: 49.5 mL/min (by C-G formula based on SCr of 0.78 mg/dL). No results for input(s): LIPASE, AMYLASE in the last 72 hours. No results for input(s): CKTOTAL, CKMB, CKMBINDEX, TROPONINI in the last 72 hours. Invalid input(s): POCBNP No results for input(s): DDIMER in the last 72 hours. No results for input(s): HGBA1C in the last 72 hours. No results for input(s): CHOL, HDL, LDLCALC, TRIG, CHOLHDL, LDLDIRECT in the last 72 hours. No results for input(s): TSH, T4TOTAL, T3FREE, THYROIDAB  in the last 72 hours.  Invalid input(s): FREET3 No results for input(s): VITAMINB12, FOLATE, FERRITIN, TIBC, IRON, RETICCTPCT in the last 72 hours. Coags: No results for input(s): INR in the last 72 hours.  Invalid input(s): PT Microbiology: Recent Results (from the past 240 hour(s))  Culture, blood (routine x 2)     Status: None (Preliminary result)   Collection Time: 10/25/18  7:11 PM  Result Value Ref Range Status   Specimen Description BLOOD BLOOD RIGHT FOREARM  Final   Special Requests   Final    BOTTLES DRAWN AEROBIC AND ANAEROBIC Blood Culture adequate volume   Culture   Final    NO GROWTH < 24 HOURS Performed at [facility], [address] [place],  [place] [address]    Report Status PENDING  Incomplete  Culture, blood (routine x 2)     Status: None (Preliminary result)   Collection Time: 10/25/18  7:11 PM  Result Value Ref Range Status   Specimen Description BLOOD BLOOD LEFT FOREARM  Final   Special Requests   Final    BOTTLES DRAWN AEROBIC AND ANAEROBIC Blood Culture adequate volume   Culture   Final    NO GROWTH < 24 HOURS Performed at [facility], [address], [place], [place] [address]    Report Status PENDING  Incomplete  C difficile quick scan w PCR reflex     Status: None   Collection Time: 10/27/18  6:19 AM  Result Value Ref Range Status   C Diff antigen NEGATIVE NEGATIVE Final   C Diff toxin NEGATIVE NEGATIVE Final   C Diff interpretation No C. difficile detected.  Final    Comment: Performed at [facility], [address], [place], [place] [address]    FURTHER DISCHARGE INSTRUCTIONS:  Get Medicines reviewed and adjusted: Please take all your medications with you for your next visit with your Primary MD  Laboratory/radiological data: Please request your Primary MD to go over all hospital tests and procedure/radiological results at the follow up, please ask your Primary MD to get all Hospital records sent to his/her office.  In some cases, they will be blood work, cultures and biopsy results pending at the time of your discharge. Please request that your primary care M.D. goes through all the records of your hospital data and follows up on these results.  Also Note the following: If you experience worsening of your admission symptoms, develop shortness of breath, life threatening emergency, suicidal or homicidal thoughts you must seek medical attention immediately by calling 911 or calling your MD immediately  if symptoms less severe.  You must read complete instructions/literature along with all the possible adverse reactions/side effects for all the Medicines you take and that have been prescribed to you.  Take any new Medicines after you have completely understood and accpet all the possible adverse reactions/side effects.   Do not drive when taking Pain medications or sleeping medications (Benzodaizepines)  Do not take more than prescribed Pain, Sleep and Anxiety Medications. It is not advisable to combine anxiety,sleep and pain medications without talking with your primary care practitioner  Special Instructions: If you have smoked or chewed Tobacco  in the last 2 yrs please stop smoking, stop any regular Alcohol  and or any Recreational drug use.  Wear Seat belts while driving.  Please note: You were cared for by a hospitalist during your hospital stay. Once you are discharged, your primary care physician will handle any further  medical issues. Please note that NO REFILLS for any discharge medications will be authorized once you are discharged, as it is imperative that you return to your primary care physician (or establish a relationship with a primary care physician if you do not have one) for your post hospital discharge needs so that they can reassess your need for medications and monitor your lab values.  Total Time spent coordinating discharge including counseling, education and face to face time equals 35 minutes.  SignedJeoffrey Massed 10/27/2018 9:23 AM

## 2018-10-27 NOTE — Care Management Note (Signed)
Case Management Note  Patient Details  Name: Cynthia Guzman MRN: [id number] Date of Birth: 12/28/1931  Subjective/Objective:                    Action/Plan:  Patient's daughter would like to use [facility] for [facility]. Referral placed to clinical liaison. No other CM needs identified. Signing off.   Expected Discharge Date:  10/27/18               Expected Discharge Plan:  Home w Home Health Services  In-House Referral:     Discharge planning Services  CM Consult  Post Acute Care Choice:  Home Health Choice offered to:  Adult Children  DME Arranged:    DME Agency:     HH Arranged:  PT, OT HH Agency:  [facility]  Status of Service:  Completed, signed off  If discussed at Long Length of Stay Meetings, dates discussed:    Additional Comments:  Lawerance Sabal, RN 10/27/2018, 10:35 AM

## 2018-10-27 NOTE — Progress Notes (Signed)
Nsg Discharge Note  Admit Date:  10/25/2018 Discharge date: 10/27/2018   Cynthia Guzman to be D/C'd Home per MD order.  AVS completed.  Copy for chart, and copy for patient signed, and dated. Patient/caregiver able to verbalize understanding.  Discharge Medication: Allergies as of 10/27/2018      Reactions   Arthritis Pain Regimen [aspirin] Nausea And Vomiting   Pt states all arthritis medications      Medication List    TAKE these medications   amLODipine 5 MG tablet Commonly known as:  NORVASC Take 1 tablet (5 mg total) by mouth at bedtime.   aspirin EC 81 MG tablet Take 81 mg by mouth daily.   cefdinir 300 MG capsule Commonly known as:  OMNICEF Take 1 capsule (300 mg total) by mouth 2 (two) times daily for 3 days.   dorzolamide-timolol 22.3-6.8 MG/ML ophthalmic solution Commonly known as:  COSOPT Place 1 drop into both eyes 2 (two) times daily.   latanoprost 0.005 % ophthalmic solution Commonly known as:  XALATAN Place 1 drop into both eyes at bedtime.   methocarbamol 500 MG tablet Commonly known as:  ROBAXIN Take 1 tablet (500 mg total) by mouth every 6 (six) hours as needed.   metoprolol tartrate 50 MG tablet Commonly known as:  LOPRESSOR Take 50 mg by mouth daily.   morphine 30 MG tablet Commonly known as:  MSIR Take 30 mg by mouth every 6 (six) hours as needed for moderate pain. Pain   oxyCODONE 5 MG immediate release tablet Commonly known as:  Oxy IR/ROXICODONE Take 1-2 tablets (5-10 mg total) by mouth every 3 (three) hours as needed.   promethazine 25 MG tablet Commonly known as:  PHENERGAN Take 25 mg by mouth 4 (four) times daily.   sertraline 100 MG tablet Commonly known as:  ZOLOFT Take 150 mg by mouth at bedtime.   simvastatin 20 MG tablet Commonly known as:  ZOCOR Take 20 mg by mouth at bedtime.   Vitamin D 2000 units Caps Take 2,000 Units by mouth daily.   zoledronic acid 5 MG/100ML Soln injection Commonly known as:  RECLAST Inject  5 mg into the vein once.       Discharge Assessment: Vitals:   10/26/18 2212 10/27/18 0917  BP: (!) 191/134 138/74  Pulse: 63   Resp: 16   Temp: 98.3 F (36.8 C)   SpO2: 97%    Skin clean, dry and intact without evidence of skin break down, no evidence of skin tears noted. IV catheter discontinued intact. Site without signs and symptoms of complications - no redness or edema noted at insertion site, patient denies c/o pain - only slight tenderness at site.  Dressing with slight pressure applied.  D/c Instructions-Education: Discharge instructions given to patient/family with verbalized understanding. D/c education completed with patient/family including follow up instructions, medication list, d/c activities limitations if indicated, with other d/c instructions as indicated by MD - patient able to verbalize understanding, all questions fully answered. Patient instructed to return to ED, call 911, or call MD for any changes in condition.  Patient escorted via WC, and D/C home via private auto.  Lyndal Pulley, RN 10/27/2018 11:04 AM

## 2018-10-30 LAB — CULTURE, BLOOD (ROUTINE X 2)
Culture: NO GROWTH
Culture: NO GROWTH
Special Requests: ADEQUATE
Special Requests: ADEQUATE

## 2018-10-31 DIAGNOSIS — F0390 Unspecified dementia without behavioral disturbance: Secondary | ICD-10-CM | POA: Diagnosis not present

## 2018-10-31 DIAGNOSIS — K5909 Other constipation: Secondary | ICD-10-CM | POA: Diagnosis not present

## 2018-10-31 DIAGNOSIS — G8929 Other chronic pain: Secondary | ICD-10-CM | POA: Diagnosis not present

## 2018-10-31 DIAGNOSIS — K5900 Constipation, unspecified: Secondary | ICD-10-CM | POA: Diagnosis not present

## 2018-10-31 DIAGNOSIS — M810 Age-related osteoporosis without current pathological fracture: Secondary | ICD-10-CM | POA: Diagnosis not present

## 2018-10-31 DIAGNOSIS — R42 Dizziness and giddiness: Secondary | ICD-10-CM | POA: Diagnosis not present

## 2018-10-31 DIAGNOSIS — I10 Essential (primary) hypertension: Secondary | ICD-10-CM | POA: Diagnosis not present

## 2018-10-31 DIAGNOSIS — E785 Hyperlipidemia, unspecified: Secondary | ICD-10-CM | POA: Diagnosis not present

## 2018-10-31 DIAGNOSIS — M549 Dorsalgia, unspecified: Secondary | ICD-10-CM | POA: Diagnosis not present

## 2018-10-31 DIAGNOSIS — Z8701 Personal history of pneumonia (recurrent): Secondary | ICD-10-CM | POA: Diagnosis not present

## 2018-10-31 DIAGNOSIS — M5489 Other dorsalgia: Secondary | ICD-10-CM | POA: Diagnosis not present

## 2018-10-31 DIAGNOSIS — E7849 Other hyperlipidemia: Secondary | ICD-10-CM | POA: Diagnosis not present

## 2018-11-01 DIAGNOSIS — J189 Pneumonia, unspecified organism: Secondary | ICD-10-CM | POA: Diagnosis not present

## 2018-11-01 DIAGNOSIS — R197 Diarrhea, unspecified: Secondary | ICD-10-CM | POA: Diagnosis not present

## 2018-11-01 DIAGNOSIS — Z6826 Body mass index (BMI) 26.0-26.9, adult: Secondary | ICD-10-CM | POA: Diagnosis not present

## 2018-11-01 DIAGNOSIS — N200 Calculus of kidney: Secondary | ICD-10-CM | POA: Diagnosis not present

## 2018-11-01 DIAGNOSIS — I10 Essential (primary) hypertension: Secondary | ICD-10-CM | POA: Diagnosis not present

## 2018-11-01 DIAGNOSIS — E7800 Pure hypercholesterolemia, unspecified: Secondary | ICD-10-CM | POA: Diagnosis not present

## 2018-11-01 DIAGNOSIS — Z23 Encounter for immunization: Secondary | ICD-10-CM | POA: Diagnosis not present

## 2018-11-05 ENCOUNTER — Other Ambulatory Visit: Payer: Self-pay

## 2018-11-05 ENCOUNTER — Emergency Department (HOSPITAL_COMMUNITY)
Admission: EM | Admit: 2018-11-05 | Discharge: 2018-11-05 | Disposition: A | Discharge status: Home / Self Care | Payer: Medicare HMO | Attending: Emergency Medicine | Admitting: Emergency Medicine

## 2018-11-05 ENCOUNTER — Emergency Department (HOSPITAL_COMMUNITY): Payer: Medicare HMO

## 2018-11-05 ENCOUNTER — Encounter (HOSPITAL_COMMUNITY): Payer: Self-pay

## 2018-11-05 DIAGNOSIS — R197 Diarrhea, unspecified: Secondary | ICD-10-CM | POA: Insufficient documentation

## 2018-11-05 DIAGNOSIS — R1032 Left lower quadrant pain: Secondary | ICD-10-CM | POA: Insufficient documentation

## 2018-11-05 DIAGNOSIS — R112 Nausea with vomiting, unspecified: Secondary | ICD-10-CM | POA: Insufficient documentation

## 2018-11-05 DIAGNOSIS — N200 Calculus of kidney: Secondary | ICD-10-CM | POA: Diagnosis not present

## 2018-11-05 DIAGNOSIS — I10 Essential (primary) hypertension: Secondary | ICD-10-CM | POA: Insufficient documentation

## 2018-11-05 LAB — CBC WITH DIFFERENTIAL/PLATELET
Abs Immature Granulocytes: 0.04 10*3/uL (ref 0.00–0.07)
Basophils Absolute: 0.1 10*3/uL (ref 0.0–0.1)
Basophils Relative: 1 %
Eosinophils Absolute: 0.4 10*3/uL (ref 0.0–0.5)
Eosinophils Relative: 5 %
HCT: 41.6 % (ref 36.0–46.0)
Hemoglobin: 13.3 g/dL (ref 12.0–15.0)
Immature Granulocytes: 1 %
Lymphocytes Relative: 22 %
Lymphs Abs: 1.9 10*3/uL (ref 0.7–4.0)
MCH: 27.6 pg (ref 26.0–34.0)
MCHC: 32 g/dL (ref 30.0–36.0)
MCV: 86.3 fL (ref 80.0–100.0)
Monocytes Absolute: 0.7 10*3/uL (ref 0.1–1.0)
Monocytes Relative: 9 %
Neutro Abs: 5.5 10*3/uL (ref 1.7–7.7)
Neutrophils Relative %: 62 %
Platelets: 190 10*3/uL (ref 150–400)
RBC: 4.82 MIL/uL (ref 3.87–5.11)
RDW: 13.3 % (ref 11.5–15.5)
WBC: 8.5 10*3/uL (ref 4.0–10.5)
nRBC: 0 % (ref 0.0–0.2)

## 2018-11-05 LAB — URINALYSIS, ROUTINE W REFLEX MICROSCOPIC
Bilirubin Urine: NEGATIVE
Glucose, UA: NEGATIVE mg/dL
Ketones, ur: NEGATIVE mg/dL
Leukocytes, UA: NEGATIVE
Nitrite: NEGATIVE
Protein, ur: NEGATIVE mg/dL
Specific Gravity, Urine: 1.012 (ref 1.005–1.030)
pH: 5 (ref 5.0–8.0)

## 2018-11-05 LAB — COMPREHENSIVE METABOLIC PANEL
ALT: 15 U/L (ref 0–44)
AST: 21 U/L (ref 15–41)
Albumin: 3.5 g/dL (ref 3.5–5.0)
Alkaline Phosphatase: 52 U/L (ref 38–126)
Anion gap: 7 (ref 5–15)
BUN: 9 mg/dL (ref 8–23)
CO2: 26 mmol/L (ref 22–32)
Calcium: 8.9 mg/dL (ref 8.9–10.3)
Chloride: 105 mmol/L (ref 98–111)
Creatinine, Ser: 0.79 mg/dL (ref 0.44–1.00)
GFR calc Af Amer: >60 mL/min (ref >60)
GFR calc non Af Amer: >60 mL/min (ref >60)
Glucose, Bld: 157 mg/dL — ABNORMAL HIGH (ref 70–99)
Potassium: 3.1 mmol/L — ABNORMAL LOW (ref 3.5–5.1)
Sodium: 138 mmol/L (ref 135–145)
Total Bilirubin: 0.6 mg/dL (ref 0.3–1.2)
Total Protein: 6.2 g/dL — ABNORMAL LOW (ref 6.5–8.1)

## 2018-11-05 LAB — C DIFFICILE QUICK SCREEN W PCR REFLEX
C Diff antigen: NEGATIVE
C Diff interpretation: NOT DETECTED
C Diff toxin: NEGATIVE

## 2018-11-05 LAB — LIPASE, BLOOD: Lipase: 25 U/L (ref 11–51)

## 2018-11-05 MED ORDER — POTASSIUM CHLORIDE CRYS ER 20 MEQ PO TBCR
40.0000 meq | EXTENDED_RELEASE_TABLET | Freq: Once | ORAL | Status: AC
  Administered 2018-11-05: 40 meq via ORAL
  Filled 2018-11-05: qty 2

## 2018-11-05 MED ORDER — IOHEXOL 300 MG/ML  SOLN
100.0000 mL | Freq: Once | INTRAMUSCULAR | Status: AC | PRN
  Administered 2018-11-05: 100 mL via INTRAVENOUS

## 2018-11-05 MED ORDER — ONDANSETRON HCL 4 MG PO TABS: 4.0000 mg | ORAL_TABLET | Freq: Three times a day (TID) | ORAL | 0 refills | Status: DC | PRN

## 2018-11-05 NOTE — ED Notes (Signed)
Attempted to collect clean catch urine, stool contaminated in catch basin in toilet (pt states unable to hold cup and urinate).  Advised pt we need a clean catch urine to further advise on her care.

## 2018-11-05 NOTE — ED Provider Notes (Signed)
[facility] EMERGENCY DEPARTMENT Provider Note   CSN: [id number] Arrival date & time: 11/05/18  1327   History   Chief Complaint Chief Complaint  Patient presents with  . Emesis    HPI  Cynthia Guzman is a 84 y.o. female presenting with intermittent emesis onset 1 day ago. Patient reports she has also had diarrhea for 1 week. Patient reports she has taken Pepto Bismol without relief. Patient states stool is dark, but reports no blood in stool. Patient reports nothing makes the pain worse. Patient reports last episode of vomiting was last night and she vomited a few times. Patient states she has been able to eat and drink fluids today. Patient was discharged from [facility] on 11/13 for pneumonia and UTI. Patient reports she has completed the course of antibiotics. Patient denies fever, cough, shortness of breath, or chest pain. Patient reports she is here because the home health nurse advised her to be evaluated due to concern for dehydration.   HPI  Past Medical History:  Diagnosis Date  . Chronic back pain   . Complication of anesthesia    hard to wake up  . Depression   . eczema   . Glaucoma   . Hearing impairment   . Hypertension   . Memory impairment   . Osteoporosis     Patient Active Problem List   Diagnosis Date Noted  . CAP (community acquired pneumonia) 10/25/2018  . Essential hypertension 10/25/2018  . Dizziness 10/25/2018  . Hypertensive urgency 10/25/2018  . Chronic back pain 10/25/2018  . HLD (hyperlipidemia) 10/25/2018  . Postop Acute blood loss anemia 11/19/2012  . OA (osteoarthritis) of knee 11/18/2012    Past Surgical History:  Procedure Laterality Date  . ABDOMINAL HYSTERECTOMY    . BACK SURGERY     x 3  . DILATION AND CURETTAGE OF UTERUS    . EYE SURGERY     bilateral cataracts with lens implants  . HAND SURGERY     left hand surgery  . TOTAL KNEE ARTHROPLASTY  11/18/2012   Procedure: TOTAL KNEE ARTHROPLASTY;   Surgeon: Loanne Drilling, MD;  Location: [facility]S;  Service: Orthopedics;  Laterality: Right;     OB History   None      Home Medications    Prior to Admission medications   Medication Sig Start Date End Date Taking? Authorizing Provider  amLODipine (NORVASC) 5 MG tablet Take 1 tablet (5 mg total) by mouth at bedtime. 10/27/18   Ghimire, Werner Lean, MD  aspirin EC 81 MG tablet Take 81 mg by mouth daily.    [provider]  Cholecalciferol (VITAMIN D) 2000 units CAPS Take 2,000 Units by mouth daily.    [provider]  dorzolamide-timolol (COSOPT) 22.3-6.8 MG/ML ophthalmic solution Place 1 drop into both eyes 2 (two) times daily.    [provider]  latanoprost (XALATAN) 0.005 % ophthalmic solution Place 1 drop into both eyes at bedtime.    [provider]  methocarbamol (ROBAXIN) 500 MG tablet Take 1 tablet (500 mg total) by mouth every 6 (six) hours as needed. Patient not taking: Reported on 10/25/2018 11/20/12   Julien Girt, Alexzandrew L, PA-C  metoprolol (LOPRESSOR) 50 MG tablet Take 50 mg by mouth daily.     [provider]  morphine (MSIR) 30 MG tablet Take 30 mg by mouth every 6 (six) hours as needed for moderate pain. Pain    [provider]  ondansetron (ZOFRAN) 4 MG tablet  Take 1 tablet (4 mg total) by mouth every 8 (eight) hours as needed for nausea or vomiting. 11/05/18   Carlyle Basques P, PA-C  oxyCODONE (OXY IR/ROXICODONE) 5 MG immediate release tablet Take 1-2 tablets (5-10 mg total) by mouth every 3 (three) hours as needed. Patient not taking: Reported on 10/25/2018 11/20/12   Julien Girt, Alexzandrew L, PA-C  promethazine (PHENERGAN) 25 MG tablet Take 25 mg by mouth 4 (four) times daily.    [provider]  sertraline (ZOLOFT) 100 MG tablet Take 150 mg by mouth at bedtime.     [provider]  simvastatin (ZOCOR) 20 MG tablet Take 20 mg by mouth at bedtime.     [provider]  zoledronic acid (RECLAST) 5  MG/100ML SOLN injection Inject 5 mg into the vein once.    [provider]    Family History Family History  Family history unknown: Yes    Social History Social History   Tobacco Use  . Smoking status: Former Smoker    Packs/day: 1.00    Years: 60.00    Pack years: 60.00    Types: Cigarettes    Last attempt to quit: 12/26/2015    Years since quitting: 2.8  . Smokeless tobacco: Never Used  Substance Use Topics  . Alcohol use: No  . Drug use: No     Allergies   Arthritis pain regimen [aspirin]   Review of Systems Review of Systems  Constitutional: Negative for activity change, appetite change, chills, fever and unexpected weight change.  HENT: Negative for congestion, rhinorrhea and sore throat.   Eyes: Negative for visual disturbance.  Respiratory: Negative for cough and shortness of breath.   Cardiovascular: Negative for chest pain.  Gastrointestinal: Positive for diarrhea, nausea and vomiting. Negative for abdominal pain and constipation.  Endocrine: Negative for polydipsia, polyphagia and polyuria.  Genitourinary: Negative for dysuria, flank pain and frequency.  Musculoskeletal: Negative for back pain.  Skin: Negative for rash.  Allergic/Immunologic: Negative for immunocompromised state.  Psychiatric/Behavioral: The patient is not nervous/anxious.      Physical Exam Updated Vital Signs BP (!) 198/63   Pulse 65   Temp (P) 98.3 F (36.8 C) (Oral)   Resp 18   Ht 5\' 3"  (1.6 m)   Wt 68.5 kg   SpO2 94%   BMI 26.75 kg/m   Physical Exam  Constitutional: She appears well-developed and well-nourished. No distress.  HENT:  Head: Normocephalic and atraumatic.  Neck: Normal range of motion. Neck supple.  Cardiovascular: Normal rate, regular rhythm and normal heart sounds. Exam reveals no gallop and no friction rub.  No murmur heard. Pulmonary/Chest: Effort normal and breath sounds normal. No respiratory distress. She has no wheezes. She has no rales.    Abdominal: Soft. Bowel sounds are normal. She exhibits no distension and no mass. There is no tenderness. There is no rigidity, no rebound, no guarding and no CVA tenderness. No hernia.  Musculoskeletal: Normal range of motion.  Neurological: She is alert.  Skin: Skin is warm. No rash noted. She is not diaphoretic.  Psychiatric: She has a normal mood and affect.  Nursing note and vitals reviewed.    ED Treatments / Results  Labs (all labs ordered are listed, but only abnormal results are displayed) Labs Reviewed  COMPREHENSIVE METABOLIC PANEL - Abnormal; Notable for the following components:      Result Value   Potassium 3.1 (*)    Glucose, Bld 157 (*)    Total Protein 6.2 (*)  All other components within normal limits  URINALYSIS, ROUTINE W REFLEX MICROSCOPIC - Abnormal; Notable for the following components:   APPearance HAZY (*)    Hgb urine dipstick SMALL (*)    Bacteria, UA RARE (*)    All other components within normal limits  C DIFFICILE QUICK SCREEN W PCR REFLEX  GASTROINTESTINAL PANEL BY PCR, STOOL (REPLACES STOOL CULTURE)  URINE CULTURE  LIPASE, BLOOD  CBC WITH DIFFERENTIAL/PLATELET    EKG None  Radiology Ct Abdomen Pelvis W Contrast  Result Date: 11/05/2018 CLINICAL DATA:  Nausea, vomiting, diarrhea EXAM: CT ABDOMEN AND PELVIS WITH CONTRAST TECHNIQUE: Multidetector CT imaging of the abdomen and pelvis was performed using the standard protocol following bolus administration of intravenous contrast. CONTRAST:  OMNIPAQUE IOHEXOL 300 MG/ML  SOLN COMPARISON:  10/25/2018 FINDINGS: Lower chest: No acute abnormality. Hepatobiliary: There are tiny hypodensities in the right lobe of the liver. Pancreas: Fatty atrophy.  No mass. Spleen: Unremarkable. Adrenals/Urinary Tract: Adrenal glands are within normal limits. There is scarring of the kidneys worse on the left. Tiny calculus in the upper pole of the right kidney. Mild right hydronephrosis is stable. There is a  tiny calculus at the right ureterovesical junction that is best seen on sagittal reconstruction imaging. See image 66 of series 7. Bladder is otherwise unremarkable. Stomach/Bowel: No evidence of small-bowel obstruction. No obvious mass in the colon. Is small hiatal hernia is noted. Vascular/Lymphatic: Atherosclerotic calcifications of the aorta and iliac arteries. No abnormal retroperitoneal adenopathy. Reproductive: Uterus is absent.  Adnexa are within normal limits. Other: No free-fluid. Musculoskeletal: Bilateral L4 and L5 pedicle screws for L4-5 fusion. No vertebral compression. IMPRESSION: Tiny right ureterovesical junction calculus is associated with mild right hydronephrosis. Right nephrolithiasis. There are tiny hypodensities in the right lobe of the liver. If there is a history of malignancy or there is risk of malignancy, six-month follow-up MRI is recommended. Electronically Signed   By: Jolaine Click M.D.   On: 11/05/2018 19:03    Procedures Procedures (including critical care time)  Medications Ordered in ED Medications  iohexol (OMNIPAQUE) 300 MG/ML solution 100 mL (100 mLs Intravenous Contrast Given 11/05/18 1832)  potassium chloride SA (K-DUR,KLOR-CON) CR tablet 40 mEq (40 mEq Oral Given 11/05/18 1932)     Initial Impression / Assessment and Plan / ED Course  I have reviewed the triage vital signs and the nursing notes.  Pertinent labs & imaging results that were available during my care of the patient were reviewed by me and considered in my medical decision making (see chart for details).  Clinical Course as of Nov 05 2022  Tue Nov 05, 2018  1440 Patient is now experiencing left sided abdominal pain with guarding. Will order CT abdomen with contrast.    [AH]  1626 C diff   C difficile quick scan w PCR reflex [AH]  1930 BP has been elevated, but patient is asymptomatic.   BP(!): 198/63 [AH]    Clinical Course User Index [AH] Leretha Dykes, PA-C   Patient presents  with complaint of vomiting. Patient nontoxic appearing, in no apparent distress, vitals WNL.  DDX includes, but is not limited to: systemic infection, Appendicitis, biliary colic, perforated viscus. In this non-toxic, afebrile adult with a normal abdominal exam, and in light of the history, I think those considerations are very unlikely at this time.  Labs/imaging:  Ordered CBC, CMP, and Lipase due to nausea and vomiting episodes. Ordered stool testing due to diarrhea symptoms for 1 week. Ordered CT  abdomen and pelvis due to an episode of LLQ pain and guarding on exam. CT was remarkable for   Therapeutics: Provided Potassium due to hypokalemia. Provided zofran for nausea at home.  Assessment/Plan: Patient is nontoxic, nonseptic appearing, in no apparent distress.  Patient's symptoms adequately managed in emergency department.  Labs, imaging and vitals reviewed.  Patient does not meet the SIRS or Sepsis criteria.  On repeat exam patient does not have a surgical abdomin and there are no peritoneal signs.  No indication of appendicitis, bowel obstruction, bowel perforation, cholecystitis, diverticulitis. Patient discharged home with symptomatic treatment and given strict instructions for follow-up with their primary care physician.  I have also discussed reasons to return immediately to the ER.  Patient expresses understanding and agrees with plan.  Findings and plan of care discussed with supervising physician Dr. Patria Mane.    Final Clinical Impressions(s) / ED Diagnoses   Final diagnoses:  Nausea vomiting and diarrhea    ED Discharge Orders         Ordered    ondansetron (ZOFRAN) 4 MG tablet  Every 8 hours PRN     11/05/18 1959           Leretha Dykes, PA-C 11/05/18 2023    Azalia Bilis, MD 11/07/18 [phone]

## 2018-11-05 NOTE — ED Triage Notes (Signed)
Pt here today for N/V/D off and on since discharge from the hospital Sunday before last. Was in the hosp recently for bacterial PNA and HTN. Took her morning dose of lopressor 50 mg and baby aspirin today. Denies fever, chills, sweats, SOB, CP, weakness or dizziness.

## 2018-11-05 NOTE — Discharge Instructions (Addendum)
You have been seen today for nausea/vomiting. Please read and follow all provided instructions.   1. Medications: zofran (nausea), usual home medications. Discuss with your PCP your elevated blood pressure.  2. Treatment: rest, drink plenty of fluids, eat a bland diet for a few days. 3. Follow Up: Please follow up with your primary doctor in 2 days for discussion of your diagnoses and further evaluation after today's visit; if you do not have a primary care doctor use the resource guide provided to find one; Please return to the ER for any new or worsening symptoms.   Take medications as prescribed. Return to the emergency room for worsening condition or new concerning symptoms. Follow up with your regular doctor. If you don't have a regular doctor use one of the numbers below to establish a primary care doctor.  Abdominal Pain  Your exam might not show the exact reason you have abdominal pain. Since there are many different causes of abdominal pain, another checkup and more tests may be needed. It is very important to follow up for lasting (persistent) or worsening symptoms. A possible cause of abdominal pain in any person who still has his or her appendix is acute appendicitis. Appendicitis is often hard to diagnose. Normal blood tests, urine tests, ultrasound, and CT scans do not completely rule out early appendicitis or other causes of abdominal pain. Sometimes, only the changes that happen over time will allow appendicitis and other causes of abdominal pain to be determined. Other potential problems that may require surgery may also take time to become more apparent. Because of this, it is important that you follow all of the instructions below.   HOME CARE INSTRUCTIONS  Do not take laxatives unless directed by your caregiver. Rest as much as possible.  Do not eat solid food until your pain is gone: A diet of water, weak decaffeinated tea, broth or bouillon, gelatin, oral rehydration solutions  (ORS), frozen ice pops, or ice chips may be helpful.  When pain is gone: Start a light diet (dry toast, crackers, applesauce, or white rice). Increase the diet slowly as long as it does not bother you. Eat no dairy products (including cheese and eggs) and no spicy, fatty, fried, or high-fiber foods.  Use no alcohol, caffeine, or cigarettes.  Take your regular medicines unless your caregiver told you not to.  Take any prescribed medicine as directed.   SEEK IMMEDIATE MEDICAL CARE IF:  The pain does not go away.  You have a fever >101 that persists You keep throwing up (vomiting) or cannot drink liquids.  The pain becomes localized (Pain in the right side could possibly be appendicitis. In an adult, pain in the left lower portion of the abdomen could be colitis or diverticulitis). You pass bloody or black tarry stools.  You have shaking chills.  There is blood in your vomit or you see blood in your bowel movements.  Your bowel movements stop (become blocked) or you cannot pass gas.  You have bloody, frequent, or painful urination.  You have yellow discoloration in the skin or whites of the eyes.  Your stomach becomes bloated or bigger.  You have dizziness or fainting.  You have chest or back pain.

## 2018-11-05 NOTE — ED Notes (Addendum)
Pt ambulates to restroom with assistance x1. UA + Culture sent.

## 2018-11-06 LAB — GASTROINTESTINAL PANEL BY PCR, STOOL (REPLACES STOOL CULTURE)

## 2018-11-07 LAB — URINE CULTURE: Culture: <10000 — AB

## 2018-11-08 ENCOUNTER — Other Ambulatory Visit (HOSPITAL_COMMUNITY): Payer: Self-pay | Admitting: *Deleted

## 2018-11-08 DIAGNOSIS — G8929 Other chronic pain: Secondary | ICD-10-CM | POA: Diagnosis not present

## 2018-11-08 DIAGNOSIS — M810 Age-related osteoporosis without current pathological fracture: Secondary | ICD-10-CM | POA: Diagnosis not present

## 2018-11-08 DIAGNOSIS — R42 Dizziness and giddiness: Secondary | ICD-10-CM | POA: Diagnosis not present

## 2018-11-08 DIAGNOSIS — K5900 Constipation, unspecified: Secondary | ICD-10-CM | POA: Diagnosis not present

## 2018-11-08 DIAGNOSIS — F0390 Unspecified dementia without behavioral disturbance: Secondary | ICD-10-CM | POA: Diagnosis not present

## 2018-11-08 DIAGNOSIS — E785 Hyperlipidemia, unspecified: Secondary | ICD-10-CM | POA: Diagnosis not present

## 2018-11-08 DIAGNOSIS — I10 Essential (primary) hypertension: Secondary | ICD-10-CM | POA: Diagnosis not present

## 2018-11-08 DIAGNOSIS — M549 Dorsalgia, unspecified: Secondary | ICD-10-CM | POA: Diagnosis not present

## 2018-11-08 DIAGNOSIS — Z8701 Personal history of pneumonia (recurrent): Secondary | ICD-10-CM | POA: Diagnosis not present

## 2018-11-11 ENCOUNTER — Ambulatory Visit (HOSPITAL_COMMUNITY)
Admission: RE | Admit: 2018-11-11 | Discharge: 2018-11-11 | Disposition: A | Discharge status: Home / Self Care | Payer: Medicare HMO | Source: Ambulatory Visit | Attending: Internal Medicine | Admitting: Internal Medicine

## 2018-11-11 DIAGNOSIS — M810 Age-related osteoporosis without current pathological fracture: Secondary | ICD-10-CM | POA: Diagnosis not present

## 2018-11-11 MED ORDER — ZOLEDRONIC ACID 5 MG/100ML IV SOLN
5.0000 mg | Freq: Once | INTRAVENOUS | Status: AC
  Administered 2018-11-11: 5 mg via INTRAVENOUS

## 2018-11-11 MED ORDER — ZOLEDRONIC ACID 5 MG/100ML IV SOLN
INTRAVENOUS | Status: AC
  Filled 2018-11-11: qty 100

## 2018-11-12 DIAGNOSIS — G8929 Other chronic pain: Secondary | ICD-10-CM | POA: Diagnosis not present

## 2018-11-12 DIAGNOSIS — M549 Dorsalgia, unspecified: Secondary | ICD-10-CM | POA: Diagnosis not present

## 2018-11-12 DIAGNOSIS — F0390 Unspecified dementia without behavioral disturbance: Secondary | ICD-10-CM | POA: Diagnosis not present

## 2018-11-12 DIAGNOSIS — Z8701 Personal history of pneumonia (recurrent): Secondary | ICD-10-CM | POA: Diagnosis not present

## 2018-11-12 DIAGNOSIS — E785 Hyperlipidemia, unspecified: Secondary | ICD-10-CM | POA: Diagnosis not present

## 2018-11-12 DIAGNOSIS — R42 Dizziness and giddiness: Secondary | ICD-10-CM | POA: Diagnosis not present

## 2018-11-12 DIAGNOSIS — K5900 Constipation, unspecified: Secondary | ICD-10-CM | POA: Diagnosis not present

## 2018-11-12 DIAGNOSIS — I10 Essential (primary) hypertension: Secondary | ICD-10-CM | POA: Diagnosis not present

## 2018-11-12 DIAGNOSIS — M810 Age-related osteoporosis without current pathological fracture: Secondary | ICD-10-CM | POA: Diagnosis not present

## 2018-11-13 DIAGNOSIS — G8929 Other chronic pain: Secondary | ICD-10-CM | POA: Diagnosis not present

## 2018-11-13 DIAGNOSIS — F0390 Unspecified dementia without behavioral disturbance: Secondary | ICD-10-CM | POA: Diagnosis not present

## 2018-11-13 DIAGNOSIS — Z8701 Personal history of pneumonia (recurrent): Secondary | ICD-10-CM | POA: Diagnosis not present

## 2018-11-13 DIAGNOSIS — K5900 Constipation, unspecified: Secondary | ICD-10-CM | POA: Diagnosis not present

## 2018-11-13 DIAGNOSIS — M549 Dorsalgia, unspecified: Secondary | ICD-10-CM | POA: Diagnosis not present

## 2018-11-13 DIAGNOSIS — M810 Age-related osteoporosis without current pathological fracture: Secondary | ICD-10-CM | POA: Diagnosis not present

## 2018-11-13 DIAGNOSIS — R42 Dizziness and giddiness: Secondary | ICD-10-CM | POA: Diagnosis not present

## 2018-11-13 DIAGNOSIS — E785 Hyperlipidemia, unspecified: Secondary | ICD-10-CM | POA: Diagnosis not present

## 2018-11-13 DIAGNOSIS — I10 Essential (primary) hypertension: Secondary | ICD-10-CM | POA: Diagnosis not present

## 2018-11-14 NOTE — Care Management Important Message (Signed)
Important Message  Patient Details  Name: Cynthia CrosbyRachael M [place]unale MRN: [id number] Date of Birth: 10/05/1932   Medicare Important Message Given:  Yes    Iris Stefan ChurchBratton 11/14/2018, 2:37 PM

## 2018-11-15 DIAGNOSIS — Z8701 Personal history of pneumonia (recurrent): Secondary | ICD-10-CM | POA: Diagnosis not present

## 2018-11-15 DIAGNOSIS — M542 Cervicalgia: Secondary | ICD-10-CM | POA: Diagnosis not present

## 2018-11-15 DIAGNOSIS — E785 Hyperlipidemia, unspecified: Secondary | ICD-10-CM | POA: Diagnosis not present

## 2018-11-15 DIAGNOSIS — K5900 Constipation, unspecified: Secondary | ICD-10-CM | POA: Diagnosis not present

## 2018-11-15 DIAGNOSIS — F0390 Unspecified dementia without behavioral disturbance: Secondary | ICD-10-CM | POA: Diagnosis not present

## 2018-11-15 DIAGNOSIS — M549 Dorsalgia, unspecified: Secondary | ICD-10-CM | POA: Diagnosis not present

## 2018-11-15 DIAGNOSIS — M47816 Spondylosis without myelopathy or radiculopathy, lumbar region: Secondary | ICD-10-CM | POA: Diagnosis not present

## 2018-11-15 DIAGNOSIS — R42 Dizziness and giddiness: Secondary | ICD-10-CM | POA: Diagnosis not present

## 2018-11-15 DIAGNOSIS — M810 Age-related osteoporosis without current pathological fracture: Secondary | ICD-10-CM | POA: Diagnosis not present

## 2018-11-15 DIAGNOSIS — G8929 Other chronic pain: Secondary | ICD-10-CM | POA: Diagnosis not present

## 2018-11-15 DIAGNOSIS — I10 Essential (primary) hypertension: Secondary | ICD-10-CM | POA: Diagnosis not present

## 2018-11-18 DIAGNOSIS — I10 Essential (primary) hypertension: Secondary | ICD-10-CM | POA: Diagnosis not present

## 2018-11-18 DIAGNOSIS — Z6825 Body mass index (BMI) 25.0-25.9, adult: Secondary | ICD-10-CM | POA: Diagnosis not present

## 2018-11-18 DIAGNOSIS — N200 Calculus of kidney: Secondary | ICD-10-CM | POA: Diagnosis not present

## 2018-11-18 DIAGNOSIS — K7689 Other specified diseases of liver: Secondary | ICD-10-CM | POA: Diagnosis not present

## 2018-11-18 DIAGNOSIS — R110 Nausea: Secondary | ICD-10-CM | POA: Diagnosis not present

## 2018-11-18 DIAGNOSIS — R197 Diarrhea, unspecified: Secondary | ICD-10-CM | POA: Diagnosis not present

## 2018-11-19 DIAGNOSIS — I10 Essential (primary) hypertension: Secondary | ICD-10-CM | POA: Diagnosis not present

## 2018-11-19 DIAGNOSIS — G8929 Other chronic pain: Secondary | ICD-10-CM | POA: Diagnosis not present

## 2018-11-19 DIAGNOSIS — R42 Dizziness and giddiness: Secondary | ICD-10-CM | POA: Diagnosis not present

## 2018-11-19 DIAGNOSIS — F0390 Unspecified dementia without behavioral disturbance: Secondary | ICD-10-CM | POA: Diagnosis not present

## 2018-11-19 DIAGNOSIS — M549 Dorsalgia, unspecified: Secondary | ICD-10-CM | POA: Diagnosis not present

## 2018-11-19 DIAGNOSIS — E785 Hyperlipidemia, unspecified: Secondary | ICD-10-CM | POA: Diagnosis not present

## 2018-11-19 DIAGNOSIS — Z8701 Personal history of pneumonia (recurrent): Secondary | ICD-10-CM | POA: Diagnosis not present

## 2018-11-19 DIAGNOSIS — K5900 Constipation, unspecified: Secondary | ICD-10-CM | POA: Diagnosis not present

## 2018-11-19 DIAGNOSIS — M810 Age-related osteoporosis without current pathological fracture: Secondary | ICD-10-CM | POA: Diagnosis not present

## 2018-11-27 DIAGNOSIS — E785 Hyperlipidemia, unspecified: Secondary | ICD-10-CM | POA: Diagnosis not present

## 2018-11-27 DIAGNOSIS — R42 Dizziness and giddiness: Secondary | ICD-10-CM | POA: Diagnosis not present

## 2018-11-27 DIAGNOSIS — F0390 Unspecified dementia without behavioral disturbance: Secondary | ICD-10-CM | POA: Diagnosis not present

## 2018-11-27 DIAGNOSIS — I10 Essential (primary) hypertension: Secondary | ICD-10-CM | POA: Diagnosis not present

## 2018-11-27 DIAGNOSIS — K5900 Constipation, unspecified: Secondary | ICD-10-CM | POA: Diagnosis not present

## 2018-11-27 DIAGNOSIS — G8929 Other chronic pain: Secondary | ICD-10-CM | POA: Diagnosis not present

## 2018-11-27 DIAGNOSIS — M810 Age-related osteoporosis without current pathological fracture: Secondary | ICD-10-CM | POA: Diagnosis not present

## 2018-11-27 DIAGNOSIS — M549 Dorsalgia, unspecified: Secondary | ICD-10-CM | POA: Diagnosis not present

## 2018-11-27 DIAGNOSIS — Z8701 Personal history of pneumonia (recurrent): Secondary | ICD-10-CM | POA: Diagnosis not present

## 2018-12-04 DIAGNOSIS — M1990 Unspecified osteoarthritis, unspecified site: Secondary | ICD-10-CM | POA: Diagnosis not present

## 2018-12-04 DIAGNOSIS — I10 Essential (primary) hypertension: Secondary | ICD-10-CM | POA: Diagnosis not present

## 2018-12-04 DIAGNOSIS — F558 Abuse of other non-psychoactive substances: Secondary | ICD-10-CM | POA: Diagnosis not present

## 2018-12-04 DIAGNOSIS — I7389 Other specified peripheral vascular diseases: Secondary | ICD-10-CM | POA: Diagnosis not present

## 2018-12-04 DIAGNOSIS — Z6826 Body mass index (BMI) 26.0-26.9, adult: Secondary | ICD-10-CM | POA: Diagnosis not present

## 2018-12-04 DIAGNOSIS — E559 Vitamin D deficiency, unspecified: Secondary | ICD-10-CM | POA: Diagnosis not present

## 2018-12-04 DIAGNOSIS — G309 Alzheimer's disease, unspecified: Secondary | ICD-10-CM | POA: Diagnosis not present

## 2018-12-04 DIAGNOSIS — M810 Age-related osteoporosis without current pathological fracture: Secondary | ICD-10-CM | POA: Diagnosis not present

## 2018-12-04 DIAGNOSIS — E7800 Pure hypercholesterolemia, unspecified: Secondary | ICD-10-CM | POA: Diagnosis not present

## 2018-12-31 DIAGNOSIS — I10 Essential (primary) hypertension: Secondary | ICD-10-CM | POA: Diagnosis not present

## 2018-12-31 DIAGNOSIS — Z6826 Body mass index (BMI) 26.0-26.9, adult: Secondary | ICD-10-CM | POA: Diagnosis not present

## 2018-12-31 DIAGNOSIS — R2231 Localized swelling, mass and lump, right upper limb: Secondary | ICD-10-CM | POA: Diagnosis not present

## 2018-12-31 DIAGNOSIS — J358 Other chronic diseases of tonsils and adenoids: Secondary | ICD-10-CM | POA: Diagnosis not present

## 2019-01-31 DIAGNOSIS — I10 Essential (primary) hypertension: Secondary | ICD-10-CM | POA: Diagnosis not present

## 2019-01-31 DIAGNOSIS — R600 Localized edema: Secondary | ICD-10-CM | POA: Diagnosis not present

## 2019-01-31 DIAGNOSIS — G479 Sleep disorder, unspecified: Secondary | ICD-10-CM | POA: Diagnosis not present

## 2019-01-31 DIAGNOSIS — Z6827 Body mass index (BMI) 27.0-27.9, adult: Secondary | ICD-10-CM | POA: Diagnosis not present

## 2019-02-17 DIAGNOSIS — R600 Localized edema: Secondary | ICD-10-CM | POA: Diagnosis not present

## 2019-02-17 DIAGNOSIS — I10 Essential (primary) hypertension: Secondary | ICD-10-CM | POA: Diagnosis not present

## 2019-02-17 DIAGNOSIS — G479 Sleep disorder, unspecified: Secondary | ICD-10-CM | POA: Diagnosis not present

## 2019-02-17 DIAGNOSIS — Z6827 Body mass index (BMI) 27.0-27.9, adult: Secondary | ICD-10-CM | POA: Diagnosis not present

## 2019-02-25 DIAGNOSIS — M47816 Spondylosis without myelopathy or radiculopathy, lumbar region: Secondary | ICD-10-CM | POA: Diagnosis not present

## 2019-02-25 DIAGNOSIS — M542 Cervicalgia: Secondary | ICD-10-CM | POA: Diagnosis not present

## 2019-02-28 DIAGNOSIS — I129 Hypertensive chronic kidney disease with stage 1 through stage 4 chronic kidney disease, or unspecified chronic kidney disease: Secondary | ICD-10-CM | POA: Diagnosis not present

## 2019-02-28 DIAGNOSIS — I951 Orthostatic hypotension: Secondary | ICD-10-CM | POA: Diagnosis not present

## 2019-02-28 DIAGNOSIS — Z6826 Body mass index (BMI) 26.0-26.9, adult: Secondary | ICD-10-CM | POA: Diagnosis not present

## 2019-02-28 DIAGNOSIS — K7689 Other specified diseases of liver: Secondary | ICD-10-CM | POA: Diagnosis not present

## 2019-02-28 DIAGNOSIS — R42 Dizziness and giddiness: Secondary | ICD-10-CM | POA: Diagnosis not present

## 2019-02-28 DIAGNOSIS — N183 Chronic kidney disease, stage 3 (moderate): Secondary | ICD-10-CM | POA: Diagnosis not present

## 2019-03-27 DIAGNOSIS — M129 Arthropathy, unspecified: Secondary | ICD-10-CM | POA: Diagnosis not present

## 2019-03-27 DIAGNOSIS — E559 Vitamin D deficiency, unspecified: Secondary | ICD-10-CM | POA: Diagnosis not present

## 2019-03-27 DIAGNOSIS — M47816 Spondylosis without myelopathy or radiculopathy, lumbar region: Secondary | ICD-10-CM | POA: Diagnosis not present

## 2019-03-27 DIAGNOSIS — I10 Essential (primary) hypertension: Secondary | ICD-10-CM | POA: Diagnosis not present

## 2019-03-27 DIAGNOSIS — M1710 Unilateral primary osteoarthritis, unspecified knee: Secondary | ICD-10-CM | POA: Diagnosis not present

## 2019-03-27 DIAGNOSIS — Z79899 Other long term (current) drug therapy: Secondary | ICD-10-CM | POA: Diagnosis not present

## 2019-03-27 DIAGNOSIS — G894 Chronic pain syndrome: Secondary | ICD-10-CM | POA: Diagnosis not present

## 2019-04-03 DIAGNOSIS — Z471 Aftercare following joint replacement surgery: Secondary | ICD-10-CM | POA: Diagnosis not present

## 2019-04-03 DIAGNOSIS — Z96651 Presence of right artificial knee joint: Secondary | ICD-10-CM | POA: Diagnosis not present

## 2019-04-03 DIAGNOSIS — M1712 Unilateral primary osteoarthritis, left knee: Secondary | ICD-10-CM | POA: Diagnosis not present

## 2019-06-04 DIAGNOSIS — F329 Major depressive disorder, single episode, unspecified: Secondary | ICD-10-CM | POA: Diagnosis not present

## 2019-06-04 DIAGNOSIS — G894 Chronic pain syndrome: Secondary | ICD-10-CM | POA: Diagnosis not present

## 2019-06-04 DIAGNOSIS — Z79899 Other long term (current) drug therapy: Secondary | ICD-10-CM | POA: Diagnosis not present

## 2019-06-04 DIAGNOSIS — M1710 Unilateral primary osteoarthritis, unspecified knee: Secondary | ICD-10-CM | POA: Diagnosis not present

## 2019-06-04 DIAGNOSIS — M47816 Spondylosis without myelopathy or radiculopathy, lumbar region: Secondary | ICD-10-CM | POA: Diagnosis not present

## 2019-06-06 DIAGNOSIS — N183 Chronic kidney disease, stage 3 (moderate): Secondary | ICD-10-CM | POA: Diagnosis not present

## 2019-06-06 DIAGNOSIS — M810 Age-related osteoporosis without current pathological fracture: Secondary | ICD-10-CM | POA: Diagnosis not present

## 2019-06-06 DIAGNOSIS — I10 Essential (primary) hypertension: Secondary | ICD-10-CM | POA: Diagnosis not present

## 2019-06-06 DIAGNOSIS — E7800 Pure hypercholesterolemia, unspecified: Secondary | ICD-10-CM | POA: Diagnosis not present

## 2019-06-11 DIAGNOSIS — R269 Unspecified abnormalities of gait and mobility: Secondary | ICD-10-CM | POA: Diagnosis not present

## 2019-06-11 DIAGNOSIS — M25662 Stiffness of left knee, not elsewhere classified: Secondary | ICD-10-CM | POA: Diagnosis not present

## 2019-06-11 DIAGNOSIS — M25562 Pain in left knee: Secondary | ICD-10-CM | POA: Diagnosis not present

## 2019-06-11 DIAGNOSIS — M62552 Muscle wasting and atrophy, not elsewhere classified, left thigh: Secondary | ICD-10-CM | POA: Diagnosis not present

## 2019-06-13 DIAGNOSIS — E7800 Pure hypercholesterolemia, unspecified: Secondary | ICD-10-CM | POA: Diagnosis not present

## 2019-06-13 DIAGNOSIS — E559 Vitamin D deficiency, unspecified: Secondary | ICD-10-CM | POA: Diagnosis not present

## 2019-06-13 DIAGNOSIS — I129 Hypertensive chronic kidney disease with stage 1 through stage 4 chronic kidney disease, or unspecified chronic kidney disease: Secondary | ICD-10-CM | POA: Diagnosis not present

## 2019-06-13 DIAGNOSIS — M797 Fibromyalgia: Secondary | ICD-10-CM | POA: Diagnosis not present

## 2019-06-13 DIAGNOSIS — R600 Localized edema: Secondary | ICD-10-CM | POA: Diagnosis not present

## 2019-06-13 DIAGNOSIS — Z0000 Encounter for general adult medical examination without abnormal findings: Secondary | ICD-10-CM | POA: Diagnosis not present

## 2019-06-13 DIAGNOSIS — I739 Peripheral vascular disease, unspecified: Secondary | ICD-10-CM | POA: Diagnosis not present

## 2019-06-13 DIAGNOSIS — N183 Chronic kidney disease, stage 3 (moderate): Secondary | ICD-10-CM | POA: Diagnosis not present

## 2019-06-13 DIAGNOSIS — M810 Age-related osteoporosis without current pathological fracture: Secondary | ICD-10-CM | POA: Diagnosis not present

## 2019-06-19 DIAGNOSIS — M62552 Muscle wasting and atrophy, not elsewhere classified, left thigh: Secondary | ICD-10-CM | POA: Diagnosis not present

## 2019-06-19 DIAGNOSIS — M25562 Pain in left knee: Secondary | ICD-10-CM | POA: Diagnosis not present

## 2019-06-19 DIAGNOSIS — R269 Unspecified abnormalities of gait and mobility: Secondary | ICD-10-CM | POA: Diagnosis not present

## 2019-06-19 DIAGNOSIS — M25662 Stiffness of left knee, not elsewhere classified: Secondary | ICD-10-CM | POA: Diagnosis not present

## 2019-06-23 DIAGNOSIS — M25662 Stiffness of left knee, not elsewhere classified: Secondary | ICD-10-CM | POA: Diagnosis not present

## 2019-06-23 DIAGNOSIS — M62552 Muscle wasting and atrophy, not elsewhere classified, left thigh: Secondary | ICD-10-CM | POA: Diagnosis not present

## 2019-06-23 DIAGNOSIS — M25562 Pain in left knee: Secondary | ICD-10-CM | POA: Diagnosis not present

## 2019-06-23 DIAGNOSIS — R269 Unspecified abnormalities of gait and mobility: Secondary | ICD-10-CM | POA: Diagnosis not present

## 2019-06-26 DIAGNOSIS — M25562 Pain in left knee: Secondary | ICD-10-CM | POA: Diagnosis not present

## 2019-06-26 DIAGNOSIS — R269 Unspecified abnormalities of gait and mobility: Secondary | ICD-10-CM | POA: Diagnosis not present

## 2019-06-26 DIAGNOSIS — F4321 Adjustment disorder with depressed mood: Secondary | ICD-10-CM | POA: Diagnosis not present

## 2019-06-26 DIAGNOSIS — M25662 Stiffness of left knee, not elsewhere classified: Secondary | ICD-10-CM | POA: Diagnosis not present

## 2019-06-26 DIAGNOSIS — M62552 Muscle wasting and atrophy, not elsewhere classified, left thigh: Secondary | ICD-10-CM | POA: Diagnosis not present

## 2019-06-30 DIAGNOSIS — M62552 Muscle wasting and atrophy, not elsewhere classified, left thigh: Secondary | ICD-10-CM | POA: Diagnosis not present

## 2019-06-30 DIAGNOSIS — M25562 Pain in left knee: Secondary | ICD-10-CM | POA: Diagnosis not present

## 2019-06-30 DIAGNOSIS — R269 Unspecified abnormalities of gait and mobility: Secondary | ICD-10-CM | POA: Diagnosis not present

## 2019-06-30 DIAGNOSIS — M25662 Stiffness of left knee, not elsewhere classified: Secondary | ICD-10-CM | POA: Diagnosis not present

## 2019-07-03 DIAGNOSIS — M25562 Pain in left knee: Secondary | ICD-10-CM | POA: Diagnosis not present

## 2019-07-03 DIAGNOSIS — R269 Unspecified abnormalities of gait and mobility: Secondary | ICD-10-CM | POA: Diagnosis not present

## 2019-07-03 DIAGNOSIS — M25662 Stiffness of left knee, not elsewhere classified: Secondary | ICD-10-CM | POA: Diagnosis not present

## 2019-07-03 DIAGNOSIS — M62552 Muscle wasting and atrophy, not elsewhere classified, left thigh: Secondary | ICD-10-CM | POA: Diagnosis not present

## 2019-07-03 DIAGNOSIS — M1712 Unilateral primary osteoarthritis, left knee: Secondary | ICD-10-CM | POA: Diagnosis not present

## 2019-07-07 DIAGNOSIS — R269 Unspecified abnormalities of gait and mobility: Secondary | ICD-10-CM | POA: Diagnosis not present

## 2019-07-07 DIAGNOSIS — M62552 Muscle wasting and atrophy, not elsewhere classified, left thigh: Secondary | ICD-10-CM | POA: Diagnosis not present

## 2019-07-07 DIAGNOSIS — M25662 Stiffness of left knee, not elsewhere classified: Secondary | ICD-10-CM | POA: Diagnosis not present

## 2019-07-07 DIAGNOSIS — M25562 Pain in left knee: Secondary | ICD-10-CM | POA: Diagnosis not present

## 2019-07-14 DIAGNOSIS — R269 Unspecified abnormalities of gait and mobility: Secondary | ICD-10-CM | POA: Diagnosis not present

## 2019-07-14 DIAGNOSIS — M25662 Stiffness of left knee, not elsewhere classified: Secondary | ICD-10-CM | POA: Diagnosis not present

## 2019-07-14 DIAGNOSIS — M25562 Pain in left knee: Secondary | ICD-10-CM | POA: Diagnosis not present

## 2019-07-14 DIAGNOSIS — M62552 Muscle wasting and atrophy, not elsewhere classified, left thigh: Secondary | ICD-10-CM | POA: Diagnosis not present

## 2019-07-17 DIAGNOSIS — M25562 Pain in left knee: Secondary | ICD-10-CM | POA: Diagnosis not present

## 2019-07-17 DIAGNOSIS — M62552 Muscle wasting and atrophy, not elsewhere classified, left thigh: Secondary | ICD-10-CM | POA: Diagnosis not present

## 2019-07-17 DIAGNOSIS — M25662 Stiffness of left knee, not elsewhere classified: Secondary | ICD-10-CM | POA: Diagnosis not present

## 2019-07-17 DIAGNOSIS — R269 Unspecified abnormalities of gait and mobility: Secondary | ICD-10-CM | POA: Diagnosis not present

## 2019-08-12 ENCOUNTER — Encounter (HOSPITAL_COMMUNITY): Payer: Self-pay | Admitting: Emergency Medicine

## 2019-08-12 ENCOUNTER — Emergency Department (HOSPITAL_COMMUNITY): Payer: Medicare HMO

## 2019-08-12 ENCOUNTER — Emergency Department (HOSPITAL_COMMUNITY)
Admission: EM | Admit: 2019-08-12 | Discharge: 2019-08-12 | Disposition: A | Discharge status: Home / Self Care | Payer: Medicare HMO | Attending: Emergency Medicine | Admitting: Emergency Medicine

## 2019-08-12 DIAGNOSIS — Z79899 Other long term (current) drug therapy: Secondary | ICD-10-CM | POA: Insufficient documentation

## 2019-08-12 DIAGNOSIS — R4689 Other symptoms and signs involving appearance and behavior: Secondary | ICD-10-CM | POA: Diagnosis not present

## 2019-08-12 DIAGNOSIS — I10 Essential (primary) hypertension: Secondary | ICD-10-CM | POA: Insufficient documentation

## 2019-08-12 DIAGNOSIS — F919 Conduct disorder, unspecified: Secondary | ICD-10-CM | POA: Diagnosis not present

## 2019-08-12 DIAGNOSIS — Z87891 Personal history of nicotine dependence: Secondary | ICD-10-CM | POA: Insufficient documentation

## 2019-08-12 DIAGNOSIS — Z7982 Long term (current) use of aspirin: Secondary | ICD-10-CM | POA: Diagnosis not present

## 2019-08-12 DIAGNOSIS — Z96651 Presence of right artificial knee joint: Secondary | ICD-10-CM | POA: Insufficient documentation

## 2019-08-12 DIAGNOSIS — R4182 Altered mental status, unspecified: Secondary | ICD-10-CM | POA: Diagnosis not present

## 2019-08-12 DIAGNOSIS — R0989 Other specified symptoms and signs involving the circulatory and respiratory systems: Secondary | ICD-10-CM | POA: Diagnosis not present

## 2019-08-12 LAB — URINALYSIS, ROUTINE W REFLEX MICROSCOPIC
Bilirubin Urine: NEGATIVE
Glucose, UA: NEGATIVE mg/dL
Hgb urine dipstick: NEGATIVE
Ketones, ur: NEGATIVE mg/dL
Leukocytes,Ua: NEGATIVE
Nitrite: NEGATIVE
Protein, ur: NEGATIVE mg/dL
Specific Gravity, Urine: 1.017 (ref 1.005–1.030)
pH: 5 (ref 5.0–8.0)

## 2019-08-12 LAB — BASIC METABOLIC PANEL
Anion gap: 12 (ref 5–15)
BUN: 16 mg/dL (ref 8–23)
CO2: 24 mmol/L (ref 22–32)
Calcium: 9.5 mg/dL (ref 8.9–10.3)
Chloride: 102 mmol/L (ref 98–111)
Creatinine, Ser: 0.93 mg/dL (ref 0.44–1.00)
GFR calc Af Amer: >60 mL/min (ref >60)
GFR calc non Af Amer: 56 mL/min — ABNORMAL LOW (ref >60)
Glucose, Bld: 110 mg/dL — ABNORMAL HIGH (ref 70–99)
Potassium: 3.5 mmol/L (ref 3.5–5.1)
Sodium: 138 mmol/L (ref 135–145)

## 2019-08-12 LAB — CBC
HCT: 41.9 % (ref 36.0–46.0)
Hemoglobin: 13.7 g/dL (ref 12.0–15.0)
MCH: 29.1 pg (ref 26.0–34.0)
MCHC: 32.7 g/dL (ref 30.0–36.0)
MCV: 89.1 fL (ref 80.0–100.0)
Platelets: 234 10*3/uL (ref 150–400)
RBC: 4.7 MIL/uL (ref 3.87–5.11)
RDW: 12.6 % (ref 11.5–15.5)
WBC: 8.9 10*3/uL (ref 4.0–10.5)
nRBC: 0 % (ref 0.0–0.2)

## 2019-08-12 MED ORDER — SODIUM CHLORIDE 0.9% FLUSH: 3.0000 mL | Freq: Once | INTRAVENOUS | Status: DC

## 2019-08-12 NOTE — ED Provider Notes (Signed)
Medical screening examination/treatment/procedure(s) were conducted as a shared visit with non-physician practitioner(s) and myself.  I personally evaluated the patient during the encounter. Briefly, the patient is a 85 y.o. female with history of hypertension, depression, memory issues/dementia who presents the ED with altered mental status/confusion at times.  Patient with normal vitals.  Patient neurologically intact.  Family states has been slowly gotten a little bit more confused recently.  Concern for possible UTI.  She had a recent fall.  CT scan of her head was unremarkable.  Lab work showed no significant anemia, electrolyte abnormality, kidney injury.  Patient with no signs of urinary tract infection.  She is overall pleasant.  Unremarkable vitals.  No chest pain, no shortness of breath.  Possibly a progression of her memory issues/development of dementia.  Recommend follow-up with primary care doctor.  Family member states that she has been much closer to her baseline since being in the emergency department.  No concern for stroke.  Patient discharged from ED in good condition.  Given return precautions.  This chart was dictated using voice recognition software.  Despite best efforts to proofread,  errors can occur which can change the documentation meaning.     EKG Interpretation None          Lennice Sites, DO 08/12/19 2056

## 2019-08-12 NOTE — ED Provider Notes (Signed)
[facility] EMERGENCY DEPARTMENT Provider Note   CSN: [id number] Arrival date & time: 08/12/19  1313     History   Chief Complaint Chief Complaint  Patient presents with  . Altered Mental Status    HPI Cynthia Guzman is a 85 y.o. female with a hx of HTN, depression, hearing & memory impairment, & chronic back pain who presents to the ED with her daughter with complaints of AMS & generally not feeling well since yesterday AM. Per patient she states she just "does not feel right", she is without specific complaints otherwise. She states she was having mild headache this AM on L side but this has resolved. Per patient's daughter she has not been acting herself since yesterday AM, she further describes this as the patient not knowing where she was when she awoke from nap which is atypical as well as abnormal ADLs. She states that the patient was attempting to eat her coleslaw with her hands & by drinking of the bowl when she typically uses a fork. She also informed her that she needed to go to the bathroom when she normally would go on her own. Patient was able to ambulate to the bathroom with her cane she uses @ baseline but did so more slowly. Patient & her daughter also mention that patient had a fall 1.5 weeks ago, she rolled over out of bed & hit the left side of her head- no LOC, was not acting particularly different until yesterday. With this constellation of abnormalities patient's daughter called & spoke with PCP who recommended ED evaluation.  Patient and her daughter deny fever, dizziness, focal numbness/weakness, facial droop, slurred speech, cough, dyspnea, chest pain, abdominal pain, vomiting, diarrhea, or dysuria.  Patient's daughter does state that the patient had a UTI few years ago and had similar behavior then.     HPI  Past Medical History:  Diagnosis Date  . Chronic back pain   . Complication of anesthesia    hard to wake up  . Depression   . eczema    . Glaucoma   . Hearing impairment   . Hypertension   . Memory impairment   . Osteoporosis     Patient Active Problem List   Diagnosis Date Noted  . CAP (community acquired pneumonia) 10/25/2018  . Essential hypertension 10/25/2018  . Dizziness 10/25/2018  . Hypertensive urgency 10/25/2018  . Chronic back pain 10/25/2018  . HLD (hyperlipidemia) 10/25/2018  . Postop Acute blood loss anemia 11/19/2012  . OA (osteoarthritis) of knee 11/18/2012    Past Surgical History:  Procedure Laterality Date  . ABDOMINAL HYSTERECTOMY    . BACK SURGERY     x 3  . DILATION AND CURETTAGE OF UTERUS    . EYE SURGERY     bilateral cataracts with lens implants  . HAND SURGERY     left hand surgery  . TOTAL KNEE ARTHROPLASTY  11/18/2012   Procedure: TOTAL KNEE ARTHROPLASTY;  Surgeon: Gearlean Alf, MD;  Location: [facility];  Service: Orthopedics;  Laterality: Right;     OB History   No obstetric history on file.      Home Medications    Prior to Admission medications   Medication Sig Start Date End Date Taking? Authorizing Provider  amLODipine (NORVASC) 5 MG tablet Take 1 tablet (5 mg total) by mouth at bedtime. 10/27/18   Ghimire, Henreitta Leber, MD  aspirin EC 81 MG tablet Take 81 mg by mouth daily.  [provider]  Cholecalciferol (VITAMIN D) 2000 units CAPS Take 2,000 Units by mouth daily.    [provider]  dorzolamide-timolol (COSOPT) 22.3-6.8 MG/ML ophthalmic solution Place 1 drop into both eyes 2 (two) times daily.    [provider]  latanoprost (XALATAN) 0.005 % ophthalmic solution Place 1 drop into both eyes at bedtime.    [provider]  methocarbamol (ROBAXIN) 500 MG tablet Take 1 tablet (500 mg total) by mouth every 6 (six) hours as needed. Patient not taking: Reported on 10/25/2018 11/20/12   Julien GirtPerkins, Alexzandrew L, PA-C  metoprolol (LOPRESSOR) 50 MG tablet Take 50 mg by mouth daily.     [provider]  morphine (MSIR) 30 MG tablet  Take 30 mg by mouth every 6 (six) hours as needed for moderate pain. Pain    [provider]  ondansetron (ZOFRAN) 4 MG tablet Take 1 tablet (4 mg total) by mouth every 8 (eight) hours as needed for nausea or vomiting. 11/05/18   Carlyle BasquesHernandez, Ana P, PA-C  oxyCODONE (OXY IR/ROXICODONE) 5 MG immediate release tablet Take 1-2 tablets (5-10 mg total) by mouth every 3 (three) hours as needed. Patient not taking: Reported on 10/25/2018 11/20/12   Julien GirtPerkins, Alexzandrew L, PA-C  promethazine (PHENERGAN) 25 MG tablet Take 25 mg by mouth 4 (four) times daily.    [provider]  sertraline (ZOLOFT) 100 MG tablet Take 150 mg by mouth at bedtime.     [provider]  simvastatin (ZOCOR) 20 MG tablet Take 20 mg by mouth at bedtime.     [provider]  zoledronic acid (RECLAST) 5 MG/100ML SOLN injection Inject 5 mg into the vein once.    [provider]    Family History Family History  Family history unknown: Yes    Social History Social History   Tobacco Use  . Smoking status: Former Smoker    Packs/day: 1.00    Years: 60.00    Pack years: 60.00    Types: Cigarettes    Quit date: 12/26/2015    Years since quitting: 3.6  . Smokeless tobacco: Never Used  Substance Use Topics  . Alcohol use: No  . Drug use: No     Allergies   Arthritis pain regimen [aspirin]   Review of Systems Review of Systems  Constitutional: Negative for chills and fever.  Respiratory: Negative for cough and shortness of breath.   Cardiovascular: Negative for palpitations.  Gastrointestinal: Negative for abdominal pain, diarrhea and vomiting.  Genitourinary: Negative for dysuria.  Neurological: Positive for headaches (resolved @ present). Negative for dizziness, seizures, syncope, facial asymmetry, speech difficulty, weakness and numbness.  Psychiatric/Behavioral: Positive for confusion.  All other systems reviewed and are negative.   Physical Exam Updated Vital Signs BP  (!) 154/68 (BP Location: Right Arm)   Pulse 76   Temp 99 F (37.2 C) (Oral)   Resp 16   SpO2 99%   Physical Exam Vitals signs and nursing note reviewed.  Constitutional:      General: She is not in acute distress.    Appearance: She is well-developed. She is not toxic-appearing.  HENT:     Head: Normocephalic.     Comments: Small 6 to 7 cm diameter scabbed area to left temporal region.  No surrounding ecchymosis or underlying swelling.  No significant tenderness to palpation.    Ears:     Comments: No hemotympanum.    Nose: Nose normal.     Mouth/Throat:  Comments: Uvula midline. Eyes:     General:        Right eye: No discharge.        Left eye: No discharge.     Extraocular Movements: Extraocular movements intact.     Conjunctiva/sclera: Conjunctivae normal.     Pupils: Pupils are equal, round, and reactive to light.  Neck:     Musculoskeletal: Normal range of motion and neck supple.     Comments: No midline cervical spine tenderness or palpable step-off. Cardiovascular:     Rate and Rhythm: Normal rate and regular rhythm.  Pulmonary:     Effort: Pulmonary effort is normal. No respiratory distress.     Breath sounds: Normal breath sounds. No wheezing, rhonchi or rales.  Abdominal:     General: There is no distension.     Palpations: Abdomen is soft.     Tenderness: There is no abdominal tenderness. There is no guarding or rebound.  Musculoskeletal:     Comments: Intact active range of motion throughout bilateral upper and lower extremities without point/focal bony tenderness.  No midline spinal tenderness or palpable step-off.  Skin:    General: Skin is warm and dry.     Findings: No rash.  Neurological:     Mental Status: She is alert.     Comments: Clear speech.  No facial droop.  Oriented x 4. CN III through XII grossly intact.  5 out of 5 symmetric grip strength.  5 out of 5 strength of plantar dorsiflexion bilaterally.  Sensation grossly intact x4.  Intact  finger-to-nose.  Negative pronator drift.  Psychiatric:        Behavior: Behavior normal.    ED Treatments / Results  Labs (all labs ordered are listed, but only abnormal results are displayed) Labs Reviewed  BASIC METABOLIC PANEL - Abnormal; Notable for the following components:      Result Value   Glucose, Bld 110 (*)    GFR calc non Af Amer 56 (*)    All other components within normal limits  URINE CULTURE  CBC  URINALYSIS, ROUTINE W REFLEX MICROSCOPIC  CBG MONITORING, ED    EKG None  Radiology Dg Chest 2 View  Result Date: 08/12/2019 CLINICAL DATA:  Altered mental status for a day. History of hypertension. Former smoker. EXAM: CHEST - 2 VIEW COMPARISON:  Chest x-ray dated 10/25/2018. FINDINGS: Stable mild cardiomegaly. Overall cardiomediastinal silhouette is stable. Central pulmonary vascular congestion. No confluent opacity to suggest consolidating pneumonia or overt alveolar pulmonary edema. No pleural effusion or pneumothorax seen. Stable kyphosis of the thoracic spine with associated mild degenerative spondylosis throughout. No acute or suspicious osseous finding. IMPRESSION: 1. Cardiomegaly with central pulmonary vascular congestion suggesting mild CHF/volume overload. 2. No evidence of pneumonia or pulmonary edema. Electronically Signed   By: Bary RichardStan  Maynard M.D.   On: 08/12/2019 18:51   Ct Head Wo Contrast  Result Date: 08/12/2019 CLINICAL DATA:  Altered mental status since a fall 3 days ago at home. Abrasion on the left side of the head. EXAM: CT HEAD WITHOUT CONTRAST TECHNIQUE: Contiguous axial images were obtained from the base of the skull through the vertex without intravenous contrast. COMPARISON:  CT scan dated 10/25/2018 FINDINGS: Brain: There is no acute intracranial hemorrhage or infarction or mass lesion. There is diffuse cerebral cortical atrophy with secondary ventricular dilatation, unchanged. There is extensive periventricular white matter lucency as well as a  few small old lacunar infarcts in the basal ganglia, all unchanged since the prior  study. Vascular: No hyperdense vessel or unexpected calcification. Skull: Normal. Negative for fracture or focal lesion. Sinuses/Orbits: Normal. Other: None IMPRESSION: No acute intracranial abnormality. Atrophy with extensive chronic small vessel ischemic changes. Electronically Signed   By: Francene BoyersJames  Maxwell M.D.   On: 08/12/2019 17:49    Procedures Procedures (including critical care time)  Medications Ordered in ED Medications  sodium chloride flush (NS) 0.9 % injection 3 mL (has no administration in time range)     Initial Impression / Assessment and Plan / ED Course  I have reviewed the triage vital signs and the nursing notes.  Pertinent labs & imaging results that were available during my care of the patient were reviewed by me and considered in my medical decision making (see chart for details).   Patient presents to the ED w/ her daughter for abnormal behavior & just not feeling quite right. Nontoxic appearing, no apparent distress, vitals with somewhat elevated BP- doubt HTN emergency. Rectal temp 99. On exam patient is alert & oriented x 4 w/o focal neuro deficits.   Work-up reviewed:  CBC: No leukocytosis or anemia BMP: No hyper/hypoglycemia, significant electrolyte derangement, or renal dysfunction.  UA: No UTI.  CT head: No acute abnormality.  CXR: No pneumonia, some mild CHF findings- patient is not complaining of dyspnea, she is not hypoxic.   No infectious source identified: UA negative for UTI, chest x-ray without pneumonia, no meningismus, no abdominal tenderness.CT head without bleed, additionally no findings of infarct.  She is without focal neuro deficits, this does not seem consistent with an acute ischemic CVA.  Unclear definitive etiology to patient's symptoms.  She is actually feeling much better while in the emergency department is requesting discharge.  Her daughter feels that she  has improved since yesterday.  She had transiently elevated BP documented in the 220s- improved to 160s, again doubt HTN emergency.  She appears appropriate for discharge home with close PCP follow-up. I discussed results, treatment plan, need for follow-up, and return precautions with the patient & her daughter @ bedside. Provided opportunity for questions, patient & her daughter confirmed understanding and are in agreement with plan.   Findings and plan of care discussed with supervising physician Dr. Lockie Molauratolo who has evaluated patient, provided guidance, & is in agreement.   Final Clinical Impressions(s) / ED Diagnoses   Final diagnoses:  Abnormal behavior    ED Discharge Orders    None       Cherly Andersonetrucelli, Samantha R, PA-C 08/12/19 2041    Virgina NorfolkCuratolo, Adam, DO 08/12/19 2359

## 2019-08-12 NOTE — Discharge Instructions (Addendum)
You were seen in the emergency department today for not feeling right and abnormal behavior.  Your work-up in the ER was overall reassuring.  The CT scan of your head did not show any new/acute abnormalities.  Your lab work did not show any concerning findings.  Your chest x-ray showed some mild signs of fluid, please discuss with your primary care provider, however we do not think this is the cause of your symptoms.  Please follow-up with your primary care provider within the next 1 to 3 days.  Return to the ER for new or worsening symptoms including but not limited to trouble moving or feeling certain limb (s), trouble walking, dizziness like the room spinning, passing out, trouble breathing, chest pain, fever, or any other concerns.  Your blood pressure was additionally noted to be elevated in the ER, have this rechecked by your primary care at your follow-up appointment.

## 2019-08-12 NOTE — ED Triage Notes (Signed)
Per daughter pt fell out of bed on Saturday and hit her side of the head- pt is not on blood thinners. Pt was seen by ems but not taking to the hospital. Daughter was out of town when this happened and when she got back she seems to think the patient is "just not acting her normal self". Per daughter yesterday pt was eating with her hands and staring off into space at times which she noticed more yesterday.  Pt is very hard of hearing but pt is alert and 0x4. Pt states since she fell she has a headache in the front of her head. Pt has a scab on the left side of her head above ear where she had a small abrasion from fall.

## 2019-08-12 NOTE — ED Notes (Signed)
Patient verbalizes understanding of discharge instructions. Opportunity for questioning and answers were provided. Armband removed by staff, pt discharged from ED in wheelchair.  

## 2019-08-13 LAB — URINE CULTURE: Culture: NO GROWTH

## 2019-08-15 DIAGNOSIS — R197 Diarrhea, unspecified: Secondary | ICD-10-CM | POA: Diagnosis not present

## 2019-08-15 DIAGNOSIS — I129 Hypertensive chronic kidney disease with stage 1 through stage 4 chronic kidney disease, or unspecified chronic kidney disease: Secondary | ICD-10-CM | POA: Diagnosis not present

## 2019-08-15 DIAGNOSIS — G309 Alzheimer's disease, unspecified: Secondary | ICD-10-CM | POA: Diagnosis not present

## 2019-08-15 DIAGNOSIS — R404 Transient alteration of awareness: Secondary | ICD-10-CM | POA: Diagnosis not present

## 2019-08-15 DIAGNOSIS — N183 Chronic kidney disease, stage 3 (moderate): Secondary | ICD-10-CM | POA: Diagnosis not present

## 2019-08-15 DIAGNOSIS — F419 Anxiety disorder, unspecified: Secondary | ICD-10-CM | POA: Diagnosis not present

## 2019-08-23 ENCOUNTER — Emergency Department (HOSPITAL_COMMUNITY)
Admission: EM | Admit: 2019-08-23 | Discharge: 2019-08-23 | Disposition: A | Discharge status: Home / Self Care | Payer: Medicare HMO | Attending: Emergency Medicine | Admitting: Emergency Medicine

## 2019-08-23 ENCOUNTER — Encounter (HOSPITAL_COMMUNITY): Payer: Self-pay | Admitting: Emergency Medicine

## 2019-08-23 ENCOUNTER — Emergency Department (HOSPITAL_COMMUNITY): Payer: Medicare HMO

## 2019-08-23 ENCOUNTER — Other Ambulatory Visit: Payer: Self-pay

## 2019-08-23 DIAGNOSIS — R8290 Unspecified abnormal findings in urine: Secondary | ICD-10-CM | POA: Diagnosis not present

## 2019-08-23 DIAGNOSIS — K8689 Other specified diseases of pancreas: Secondary | ICD-10-CM | POA: Diagnosis not present

## 2019-08-23 DIAGNOSIS — I10 Essential (primary) hypertension: Secondary | ICD-10-CM | POA: Insufficient documentation

## 2019-08-23 DIAGNOSIS — Z79899 Other long term (current) drug therapy: Secondary | ICD-10-CM | POA: Insufficient documentation

## 2019-08-23 DIAGNOSIS — Z87891 Personal history of nicotine dependence: Secondary | ICD-10-CM | POA: Diagnosis not present

## 2019-08-23 DIAGNOSIS — Z7982 Long term (current) use of aspirin: Secondary | ICD-10-CM | POA: Diagnosis not present

## 2019-08-23 DIAGNOSIS — M545 Low back pain, unspecified: Secondary | ICD-10-CM

## 2019-08-23 LAB — URINALYSIS, ROUTINE W REFLEX MICROSCOPIC
Bacteria, UA: NONE SEEN
Bilirubin Urine: NEGATIVE
Glucose, UA: NEGATIVE mg/dL
Ketones, ur: NEGATIVE mg/dL
Leukocytes,Ua: NEGATIVE
Nitrite: NEGATIVE
Protein, ur: NEGATIVE mg/dL
Specific Gravity, Urine: 1.011 (ref 1.005–1.030)
pH: 6 (ref 5.0–8.0)

## 2019-08-23 LAB — CBC WITH DIFFERENTIAL/PLATELET
Abs Immature Granulocytes: 0.03 10*3/uL (ref 0.00–0.07)
Basophils Absolute: 0.1 10*3/uL (ref 0.0–0.1)
Basophils Relative: 1 %
Eosinophils Absolute: 0.1 10*3/uL (ref 0.0–0.5)
Eosinophils Relative: 1 %
HCT: 41.9 % (ref 36.0–46.0)
Hemoglobin: 14.2 g/dL (ref 12.0–15.0)
Immature Granulocytes: 0 %
Lymphocytes Relative: 20 %
Lymphs Abs: 2 10*3/uL (ref 0.7–4.0)
MCH: 29.1 pg (ref 26.0–34.0)
MCHC: 33.9 g/dL (ref 30.0–36.0)
MCV: 85.9 fL (ref 80.0–100.0)
Monocytes Absolute: 1.2 10*3/uL — ABNORMAL HIGH (ref 0.1–1.0)
Monocytes Relative: 12 %
Neutro Abs: 6.8 10*3/uL (ref 1.7–7.7)
Neutrophils Relative %: 66 %
Platelets: 209 10*3/uL (ref 150–400)
RBC: 4.88 MIL/uL (ref 3.87–5.11)
RDW: 12.5 % (ref 11.5–15.5)
WBC: 10.2 10*3/uL (ref 4.0–10.5)
nRBC: 0 % (ref 0.0–0.2)

## 2019-08-23 LAB — BASIC METABOLIC PANEL
Anion gap: 13 (ref 5–15)
BUN: 14 mg/dL (ref 8–23)
CO2: 23 mmol/L (ref 22–32)
Calcium: 9.6 mg/dL (ref 8.9–10.3)
Chloride: 103 mmol/L (ref 98–111)
Creatinine, Ser: 0.89 mg/dL (ref 0.44–1.00)
GFR calc Af Amer: >60 mL/min (ref >60)
GFR calc non Af Amer: 60 mL/min — ABNORMAL LOW (ref >60)
Glucose, Bld: 108 mg/dL — ABNORMAL HIGH (ref 70–99)
Potassium: 3.5 mmol/L (ref 3.5–5.1)
Sodium: 139 mmol/L (ref 135–145)

## 2019-08-23 MED ORDER — HYDROCODONE-ACETAMINOPHEN 5-325 MG PO TABS
1.0000 | ORAL_TABLET | Freq: Once | ORAL | Status: AC
  Administered 2019-08-23: 1 via ORAL
  Filled 2019-08-23: qty 1

## 2019-08-23 MED ORDER — LORAZEPAM 2 MG/ML IJ SOLN
0.5000 mg | Freq: Once | INTRAMUSCULAR | Status: AC
  Administered 2019-08-23: 0.5 mg via INTRAVENOUS
  Filled 2019-08-23: qty 1

## 2019-08-23 MED ORDER — MORPHINE SULFATE (PF) 2 MG/ML IV SOLN
2.0000 mg | Freq: Once | INTRAVENOUS | Status: AC
  Administered 2019-08-23: 2 mg via INTRAVENOUS
  Filled 2019-08-23: qty 1

## 2019-08-23 MED ORDER — SODIUM CHLORIDE 0.9 % IV SOLN
INTRAVENOUS | Status: DC
  Administered 2019-08-23: 08:00:00 via INTRAVENOUS

## 2019-08-23 MED ORDER — HYDROCODONE-ACETAMINOPHEN 5-325 MG PO TABS: 2.0000 | ORAL_TABLET | Freq: Four times a day (QID) | ORAL | 0 refills | Status: DC | PRN

## 2019-08-23 NOTE — ED Triage Notes (Signed)
Pt here for continued back pain since fall on the 18th. Seen here for the fall. Pain was originally in head and neck but now is worse to lower back.

## 2019-08-23 NOTE — ED Provider Notes (Signed)
[facility] EMERGENCY DEPARTMENT Provider Note   CSN: [id number] Arrival date & time: 08/23/19  0705     History   Chief Complaint No chief complaint on file.   HPI Cynthia Guzman is a 85 y.o. female.     85 year old female presents with recurrent right-sided back pain times several weeks but worse over the last 3 to 4 days.  Pain characterizes dull and worse with movement.  Has noted some dark urine but denies any dysuria or fever.  No cough or congestion.  She is not been short of breath.  Pain does not radiate down her leg.  No bowel or bladder dysfunction.  Denies any rashes.  Has had temporary relief with muscle relaxants.     Past Medical History:  Diagnosis Date  . Chronic back pain   . Complication of anesthesia    hard to wake up  . Depression   . eczema   . Glaucoma   . Hearing impairment   . Hypertension   . Memory impairment   . Osteoporosis     Patient Active Problem List   Diagnosis Date Noted  . CAP (community acquired pneumonia) 10/25/2018  . Essential hypertension 10/25/2018  . Dizziness 10/25/2018  . Hypertensive urgency 10/25/2018  . Chronic back pain 10/25/2018  . HLD (hyperlipidemia) 10/25/2018  . Postop Acute blood loss anemia 11/19/2012  . OA (osteoarthritis) of knee 11/18/2012    Past Surgical History:  Procedure Laterality Date  . ABDOMINAL HYSTERECTOMY    . BACK SURGERY     x 3  . DILATION AND CURETTAGE OF UTERUS    . EYE SURGERY     bilateral cataracts with lens implants  . HAND SURGERY     left hand surgery  . TOTAL KNEE ARTHROPLASTY  11/18/2012   Procedure: TOTAL KNEE ARTHROPLASTY;  Surgeon: Loanne Drilling, MD;  Location: [facility];  Service: Orthopedics;  Laterality: Right;     OB History   No obstetric history on file.      Home Medications    Prior to Admission medications   Medication Sig Start Date End Date Taking? Authorizing Provider  amLODipine (NORVASC) 5 MG tablet Take 1 tablet (5 mg  total) by mouth at bedtime. 10/27/18   Ghimire, Werner Lean, MD  aspirin EC 81 MG tablet Take 81 mg by mouth daily.    [provider]  Cholecalciferol (VITAMIN D) 2000 units CAPS Take 2,000 Units by mouth daily.    [provider]  dorzolamide-timolol (COSOPT) 22.3-6.8 MG/ML ophthalmic solution Place 1 drop into both eyes 2 (two) times daily.    [provider]  latanoprost (XALATAN) 0.005 % ophthalmic solution Place 1 drop into both eyes at bedtime.    [provider]  methocarbamol (ROBAXIN) 500 MG tablet Take 1 tablet (500 mg total) by mouth every 6 (six) hours as needed. Patient not taking: Reported on 10/25/2018 11/20/12   Julien Girt, Alexzandrew L, PA-C  metoprolol (LOPRESSOR) 50 MG tablet Take 50 mg by mouth daily.     [provider]  morphine (MSIR) 30 MG tablet Take 30 mg by mouth every 6 (six) hours as needed for moderate pain. Pain    [provider]  ondansetron (ZOFRAN) 4 MG tablet Take 1 tablet (4 mg total) by mouth every 8 (eight) hours as needed for nausea or vomiting. 11/05/18   Carlyle Basques P, PA-C  oxyCODONE (OXY IR/ROXICODONE) 5 MG immediate release tablet Take 1-2 tablets (5-10 mg total)  by mouth every 3 (three) hours as needed. Patient not taking: Reported on 10/25/2018 11/20/12   Julien GirtPerkins, Alexzandrew L, PA-C  promethazine (PHENERGAN) 25 MG tablet Take 25 mg by mouth 4 (four) times daily.    [provider]  sertraline (ZOLOFT) 100 MG tablet Take 150 mg by mouth at bedtime.     [provider]  simvastatin (ZOCOR) 20 MG tablet Take 20 mg by mouth at bedtime.     [provider]  zoledronic acid (RECLAST) 5 MG/100ML SOLN injection Inject 5 mg into the vein once.    [provider]    Family History Family History  Family history unknown: Yes    Social History Social History   Tobacco Use  . Smoking status: Former Smoker    Packs/day: 1.00    Years: 60.00    Pack years: 60.00     Types: Cigarettes    Quit date: 12/26/2015    Years since quitting: 3.6  . Smokeless tobacco: Never Used  Substance Use Topics  . Alcohol use: No  . Drug use: No     Allergies   Arthritis pain regimen [aspirin]   Review of Systems Review of Systems  All other systems reviewed and are negative.    Physical Exam Updated Vital Signs BP (!) 184/108 (BP Location: Right Arm)   Pulse 78   Temp 97.7 F (36.5 C) (Oral)   Resp 18   SpO2 95%   Physical Exam Vitals signs and nursing note reviewed.  Constitutional:      General: She is not in acute distress.    Appearance: Normal appearance. She is well-developed. She is not toxic-appearing.  HENT:     Head: Normocephalic and atraumatic.  Eyes:     General: Lids are normal.     Conjunctiva/sclera: Conjunctivae normal.     Pupils: Pupils are equal, round, and reactive to light.  Neck:     Musculoskeletal: Normal range of motion and neck supple.     Thyroid: No thyroid mass.     Trachea: No tracheal deviation.  Cardiovascular:     Rate and Rhythm: Normal rate and regular rhythm.     Heart sounds: Normal heart sounds. No murmur. No gallop.   Pulmonary:     Effort: Pulmonary effort is normal. No respiratory distress.     Breath sounds: Normal breath sounds. No stridor. No decreased breath sounds, wheezing, rhonchi or rales.  Abdominal:     General: Bowel sounds are normal. There is no distension.     Palpations: Abdomen is soft.     Tenderness: There is no abdominal tenderness. There is no rebound.  Musculoskeletal: Normal range of motion.        General: No tenderness.       Back:  Skin:    General: Skin is warm and dry.     Findings: No abrasion or rash.  Neurological:     Mental Status: She is alert and oriented to person, place, and time.     GCS: GCS eye subscore is 4. GCS verbal subscore is 5. GCS motor subscore is 6.     Cranial Nerves: No cranial nerve deficit.     Sensory: No sensory deficit.     Comments:  Strength is 5 of 5 in all 4 extremities.  Psychiatric:        Speech: Speech normal.        Behavior: Behavior normal.      ED Treatments / Results  Labs (all labs ordered are listed, but only abnormal results are displayed) Labs Reviewed  URINE CULTURE  URINALYSIS, ROUTINE W REFLEX MICROSCOPIC  CBC WITH DIFFERENTIAL/PLATELET  BASIC METABOLIC PANEL    EKG None  Radiology No results found.  Procedures Procedures (including critical care time)  Medications Ordered in ED Medications  0.9 %  sodium chloride infusion (has no administration in time range)  LORazepam (ATIVAN) injection 0.5 mg (has no administration in time range)  morphine 2 MG/ML injection 2 mg (has no administration in time range)     Initial Impression / Assessment and Plan / ED Course  I have reviewed the triage vital signs and the nursing notes.  Pertinent labs & imaging results that were available during my care of the patient were reviewed by me and considered in my medical decision making (see chart for details).        Patient had her pain treated here and feels better.  Some blood noted in her urine CT was negative for stone.  She is amatory at her baseline here and we discharged to home.  Will prescribe short course of hydrocodone  Final Clinical Impressions(s) / ED Diagnoses   Final diagnoses:  None    ED Discharge Orders    None       Lacretia Leigh, MD 08/23/19 1231

## 2019-08-23 NOTE — ED Notes (Signed)
Patient ambulated to the bathroom with 2 person assist. Appears a little unsteady on her feet. Family states she normally walks with a cane and drags her feet.

## 2019-08-24 LAB — URINE CULTURE: Culture: NO GROWTH

## 2019-08-28 DIAGNOSIS — R197 Diarrhea, unspecified: Secondary | ICD-10-CM | POA: Diagnosis not present

## 2019-08-28 DIAGNOSIS — N183 Chronic kidney disease, stage 3 (moderate): Secondary | ICD-10-CM | POA: Diagnosis not present

## 2019-08-28 DIAGNOSIS — M6281 Muscle weakness (generalized): Secondary | ICD-10-CM | POA: Diagnosis not present

## 2019-08-28 DIAGNOSIS — G309 Alzheimer's disease, unspecified: Secondary | ICD-10-CM | POA: Diagnosis not present

## 2019-08-28 DIAGNOSIS — I129 Hypertensive chronic kidney disease with stage 1 through stage 4 chronic kidney disease, or unspecified chronic kidney disease: Secondary | ICD-10-CM | POA: Diagnosis not present

## 2019-08-28 DIAGNOSIS — M47816 Spondylosis without myelopathy or radiculopathy, lumbar region: Secondary | ICD-10-CM | POA: Diagnosis not present

## 2019-09-18 ENCOUNTER — Ambulatory Visit (INDEPENDENT_AMBULATORY_CARE_PROVIDER_SITE_OTHER): Payer: Medicare HMO | Admitting: Neurology

## 2019-09-18 ENCOUNTER — Encounter: Payer: Self-pay | Admitting: Neurology

## 2019-09-18 ENCOUNTER — Other Ambulatory Visit: Payer: Self-pay

## 2019-09-18 VITALS — Temp 96.6°F | Wt 162.0 lb

## 2019-09-18 DIAGNOSIS — F0391 Unspecified dementia with behavioral disturbance: Secondary | ICD-10-CM

## 2019-09-18 DIAGNOSIS — R799 Abnormal finding of blood chemistry, unspecified: Secondary | ICD-10-CM | POA: Diagnosis not present

## 2019-09-18 DIAGNOSIS — E539 Vitamin B deficiency, unspecified: Secondary | ICD-10-CM | POA: Diagnosis not present

## 2019-09-18 NOTE — Progress Notes (Signed)
[facility]    Provider:  Dr Lucia Gaskins Requesting Provider: Tisovec, Adelfa Koh, MD Primary Care Provider:  Gaspar Garbe, MD  CC:  Memory loss  HPI:  Cynthia Guzman is a 85 y.o. female here as requested by Tisovec, Adelfa Koh, MD for memory loss. PMHx memory impairment, HTN, glaucoma, depression, chronic back pain. She lives with her daughter who is here and provides most information. She is increasingly having a difficult time with IADLs and ADLs, she  rolled over and hit her head on the bedside, They took her to the the hospital. The hospital thought she may have dementia. She is extremely hard of hearing. She has been having memory loss at least 10 years ago. She has progressively getting worse. She started forgetting little things, more short-term memory, progressively, daughter has to manage her medications, they had to take over her finances bc she was forgetting to pay, she is not driving. No cooking or cleaning. She has trouble using the telephone. She is hearing impaired. She can dress herself. They have to remind her it its time to take a shower. She doesn't talk a lot because of her hearing. She broke her hearing aid. She is not sleeping well. She goes to her bedroom 930-10, she watches TV, no wandering at night. She is not having hallucinations or delusions. She is aggravated, frustrated and daughter says it is hard to deal with. She has depression, her husband passed away and it has been bad since then, he passed last year. They decline aricept. She is incontinent. No other focal neurologic deficits, associated symptoms, inciting events or modifiable factors.  Reviewed notes, labs and imaging from outside physicians, which showed:  CT head: No acute intracranial abnormality. Atrophy with extensive chronic small vessel ischemic changes. Personally reviewed images and agree with the above.   Review of Systems: Patient complains of symptoms per HPI as well as the  following symptoms: hearing loss, memory loss. Pertinent negatives and positives per HPI. All others negative.   Social History   Socioeconomic History  . Marital status: Married    Spouse name: Not on file  . Number of children: Not on file  . Years of education: Not on file  . Highest education level: Not on file  Occupational History  . Not on file  Social Needs  . Financial resource strain: Not on file  . Food insecurity    Worry: Not on file    Inability: Not on file  . Transportation needs    Medical: Not on file    Non-medical: Not on file  Tobacco Use  . Smoking status: Former Smoker    Packs/day: 1.00    Years: 60.00    Pack years: 60.00    Types: Cigarettes    Quit date: 12/26/2015    Years since quitting: 3.7  . Smokeless tobacco: Never Used  Substance and Sexual Activity  . Alcohol use: No  . Drug use: No  . Sexual activity: Not on file  Lifestyle  . Physical activity    Days per week: Not on file    Minutes per session: Not on file  . Stress: Not on file  Relationships  . Social Musician on phone: Not on file    Gets together: Not on file    Attends religious service: Not on file    Active member of club or organization: Not on file    Attends meetings of clubs or organizations: Not  on file    Relationship status: Not on file  . Intimate partner violence    Fear of current or ex partner: Not on file    Emotionally abused: Not on file    Physically abused: Not on file    Forced sexual activity: Not on file  Other Topics Concern  . Not on file  Social History Narrative  . Not on file    Family History  Problem Relation Age of Onset  . Alzheimer's disease Other        "all of her aunts and her mother" per daughter    Past Medical History:  Diagnosis Date  . Chronic back pain   . Complication of anesthesia    hard to wake up  . Depression   . eczema   . Glaucoma   . Hearing impairment   . Hypertension   . Memory impairment    . Osteoporosis     Patient Active Problem List   Diagnosis Date Noted  . Dementia with behavioral disturbance (HCC) 09/20/2019  . CAP (community acquired pneumonia) 10/25/2018  . Essential hypertension 10/25/2018  . Dizziness 10/25/2018  . Hypertensive urgency 10/25/2018  . Chronic back pain 10/25/2018  . HLD (hyperlipidemia) 10/25/2018  . Postop Acute blood loss anemia 11/19/2012  . OA (osteoarthritis) of knee 11/18/2012    Past Surgical History:  Procedure Laterality Date  . ABDOMINAL HYSTERECTOMY    . BACK SURGERY     x 3  . DILATION AND CURETTAGE OF UTERUS    . EYE SURGERY     bilateral cataracts with lens implants  . HAND SURGERY     left hand surgery  . TOTAL KNEE ARTHROPLASTY  11/18/2012   Procedure: TOTAL KNEE ARTHROPLASTY;  Surgeon: Loanne DrillingFrank V Aluisio, MD;  Location: [facility];  Service: Orthopedics;  Laterality: Right;    Current Outpatient Medications  Medication Sig Dispense Refill  . aspirin EC 81 MG tablet Take 81 mg by mouth daily.    . buprenorphine (BUTRANS) 10 MCG/HR PTWK patch APPLY 1 PATCH ONCE PER WEEK    . Cholecalciferol (VITAMIN D) 2000 units CAPS Take 2,000 Units by mouth daily.    . metoprolol (LOPRESSOR) 50 MG tablet Take 50 mg by mouth daily.     . Probiotic Product (ALIGN PO) Take by mouth.    . sertraline (ZOLOFT) 100 MG tablet Take 150 mg by mouth at bedtime.     . simvastatin (ZOCOR) 20 MG tablet Take 20 mg by mouth at bedtime.     Marland Kitchen. telmisartan (MICARDIS) 40 MG tablet Take 40 mg by mouth daily.    Marland Kitchen. amLODipine (NORVASC) 5 MG tablet Take 1 tablet (5 mg total) by mouth at bedtime. (Patient not taking: Reported on 09/18/2019) 60 tablet 0  . dorzolamide-timolol (COSOPT) 22.3-6.8 MG/ML ophthalmic solution Place 1 drop into both eyes 2 (two) times daily.    Marland Kitchen. latanoprost (XALATAN) 0.005 % ophthalmic solution Place 1 drop into both eyes at bedtime.    . methocarbamol (ROBAXIN) 500 MG tablet Take 1 tablet (500 mg total) by mouth every 6 (six) hours as  needed. (Patient not taking: Reported on 10/25/2018) 80 tablet 0  . morphine (MSIR) 30 MG tablet Take 30 mg by mouth every 6 (six) hours as needed for moderate pain. Pain    . ondansetron (ZOFRAN) 4 MG tablet Take 1 tablet (4 mg total) by mouth every 8 (eight) hours as needed for nausea or vomiting. (Patient not taking: Reported on 09/18/2019) 4  tablet 0  . oxyCODONE (OXY IR/ROXICODONE) 5 MG immediate release tablet Take 1-2 tablets (5-10 mg total) by mouth every 3 (three) hours as needed. (Patient not taking: Reported on 10/25/2018) 90 tablet 0  . promethazine (PHENERGAN) 25 MG tablet Take 25 mg by mouth 4 (four) times daily.    . zoledronic acid (RECLAST) 5 MG/100ML SOLN injection Inject 5 mg into the vein once.     No current facility-administered medications for this visit.     Allergies as of 09/18/2019 - Review Complete 09/18/2019  Allergen Reaction Noted  . Arthritis pain regimen [aspirin] Nausea And Vomiting 06/19/2012    Vitals: Temp (!) 96.6 F (35.9 C) Comment: taken upon entry  Wt 162 lb (73.5 kg)   BMI 28.70 kg/m  Last Weight:  Wt Readings from Last 1 Encounters:  09/18/19 162 lb (73.5 kg)   Respirations 14, temp 98 Last Height:   Ht Readings from Last 1 Encounters:  11/05/18 5\' 3"  (1.6 m)     Physical exam: Exam: Gen: NAD                  CV: RRR, no MRG. No Carotid Bruits. No peripheral edema, warm, nontender Eyes: Conjunctivae clear without exudates or hemorrhage  Neuro: Detailed Neurologic Exam  Speech:    Speech is normal; impaired comprehension however is also quite hearing impaired and I have to yell Cognition:  MMSE - Mini Mental State Exam 09/18/2019  Orientation to time 4  Orientation to Place 4  Registration 3  Attention/ Calculation 0  Recall 0  Language- name 2 objects 2  Language- repeat 0  Language- follow 3 step command 2  Language- read & follow direction 1  Write a sentence 1  Copy design 0  Total score 17       The patient is  oriented to person, place, and time;     recent and remote memory impaired;     language fluent;     Impaired attention, concentration, fund of knowledge Cranial Nerves:    The pupils are equal, round, and reactive to light. Attempted fundoscopy, small pupils. Visual fields are full to finger confrontation. Extraocular movements are intact. Trigeminal sensation is intact and the muscles of mastication are normal. The face is symmetric. The palate elevates in the midline. Hearing impaired. Voice is normal. Shoulder shrug is normal. The tongue has normal motion without fasciculations.   Coordination:    No dysmetria  Gait: shuffling and stooped     Motor Observation:    no involuntary movements noted. Tone:    Normal muscle tone.    Posture:    Posture is stooped    Strength:    Difficult strength exam due to cognitive issues appears to be antigravity and symmetric     Sensation: intact to LT     Reflex Exam:  DTR's: absent Ajs, brisk patellars and biceps however difficulty exam due to cognitive problems     Toes:    The toes are upgoing bilaterally.   Clonus:    Clonus is absent.    Assessment/Plan:86 y.o. female here as requested by Tisovec, Fransico Him, MD for memory loss. PMHx memory impairment, HTN, glaucoma, depression, chronic back pain. She lives with her daughter who is here and provides most information. She is increasingly having a difficult time with IADLs and ADLs, Progressive memory loss for over 10 years. Likely Alzheimers and a component of vascular dementia given her extensive microvascular changes and gait apraxia (vascular  parkinsonism). Discussed with daughter at length when we took patient to lab. Discussed aricept, declined, likely would not make a significant clinical difference. MMSE 17/30. We agreed they can follow up as needed.  Orders Placed This Encounter  Procedures  . B12 and Folate Panel  . Methylmalonic acid, serum  . Homocysteine  . TSH  . RPR   . Comprehensive metabolic panel  . CBC     Cc: Tisovec, Adelfa Koh, MD,    Naomie Dean, MD  [facility] [address] [place], [place] [address]  Phone [phone] Fax [phone]

## 2019-09-18 NOTE — Patient Instructions (Signed)
Dementia Dementia is a condition that affects the way the brain functions. It often affects memory and thinking. Usually, dementia gets worse with time and cannot be reversed (progressive dementia). There are many types of dementia, including:  Alzheimer's disease. This type is the most common.  Vascular dementia. This type may happen as the result of a stroke.  Lewy body dementia. This type may happen to people who have Parkinson's disease.  Frontotemporal dementia. This type is caused by damage to nerve cells (neurons) in certain parts of the brain. Some people may be affected by more than one type of dementia. This is called mixed dementia. What are the causes? Dementia is caused by damage to cells in the brain. The area of the brain and the types of cells damaged determine the type of dementia. Usually, this damage is irreversible or cannot be undone. Some examples of irreversible causes include:  Conditions that affect the blood vessels of the brain, such as diabetes, heart disease, or blood vessel disease.  Genetic mutations. In some cases, changes in the brain may be caused by another condition and can be reversed or slowed. Some examples of reversible causes include:  Injury to the brain.  Certain medicines.  Infection, such as meningitis.  Metabolic problems, such as vitamin B12 deficiency or thyroid disease.  Pressure on the brain, such as from a tumor or blood clot. What are the signs or symptoms? Symptoms of dementia depend on the type of dementia. Common signs of dementia include problems with remembering, thinking, problem solving, decision making, and communicating. These signs develop slowly or get worse with time. This may include:  Problems remembering things.  Having trouble taking a bath or putting clothes on.  Forgetting appointments.  Forgetting to pay bills.  Difficulty planning and preparing meals.  Having trouble speaking.  Getting lost easily. How  is this diagnosed? This condition is diagnosed by a specialist (neurologist). It is diagnosed based on the history of your symptoms, your medical history, a physical exam, and tests. Tests may include:  Tests to evaluate brain function, such as memory tests, cognitive tests, and other tests.  Lab tests, such as blood or urine tests.  Imaging tests, such as a CT scan, a PET scan, or an MRI.  Genetic testing. This may be done if other family members have a diagnosis of certain types of dementia. Your health care provider will talk with you and your family, friends, or caregivers about your history and symptoms. How is this treated?  Treatment for this condition depends on the cause of the dementia. Progressive dementias, such as Alzheimer's disease, cannot be cured, but there may be treatments that help to manage symptoms. Treatment might involve taking medicines that may help to:  Control the dementia.  Slow down the progression of the dementia.  Manage symptoms. In some cases, treating the cause of your dementia can improve symptoms, reverse symptoms, or slow down how quickly your dementia becomes worse. Your health care provider can direct you to support groups, organizations, and other health care providers who can help with decisions about your care. Follow these instructions at home: Medicines  Take over-the-counter and prescription medicines only as told by your health care provider.  Use a pill organizer or pill reminder to help you manage your medicines.  Avoid taking medicines that can affect thinking, such as pain medicines or sleeping medicines. Lifestyle  Make healthy lifestyle choices. ? Be physically active as told by your health care provider. ? Do   not use any products that contain nicotine or tobacco, such as cigarettes, e-cigarettes, and chewing tobacco. If you need help quitting, ask your health care provider. ? Do not drink alcohol. ? Practice stress-management  techniques when you get stressed. ? Spend time with other people.  Make sure to get quality sleep. These tips can help you get a good night's rest: ? Avoid napping during the day. ? Keep your sleeping area dark and cool. ? Avoid exercising during the few hours before you go to bed. ? Avoid caffeine products in the evening. Eating and drinking  Drink enough fluid to keep your urine pale yellow.  Eat a healthy diet. General instructions   Work with your health care provider to determine what you need help with and what your safety needs are.  Talk with your health care provider about whether it is safe for you to drive.  If you were given a bracelet that identifies you as a person with memory loss or tracks your location, make sure to wear it at all times.  Work with your family to make important decisions, such as advance directives, medical power of attorney, or a living will.  Keep all follow-up visits as told by your health care provider. This is important. Where to find more information  Alzheimer's Association: www.alz.org  National Institute on Aging: www.nia.nih.gov/alzheimers  World Health Organization: www.who.int Contact a health care provider if:  You have any new or worsening symptoms.  You have problems with choking or swallowing. Get help right away if:  You feel depressed or sad, or feel that you want to harm yourself.  Your family members become concerned for your safety. If you ever feel like you may hurt yourself or others, or have thoughts about taking your own life, get help right away. You can go to your nearest emergency department or call:  Your local emergency services (911 in the [place]  A suicide crisis helpline, such as the National Suicide Prevention Lifeline at 1-800-273-8255. This is open 24 hours a day. Summary  Dementia is a condition that affects the way the brain functions. Dementia often affects memory and thinking.  Usually,  dementia gets worse with time and cannot be reversed (progressive dementia).  Treatment for this condition depends on the cause of the dementia.  Work with your health care provider to determine what you need help with and what your safety needs are.  Your health care provider can direct you to support groups, organizations, and other health care providers who can help with decisions about your care. This information is not intended to replace advice given to you by your health care provider. Make sure you discuss any questions you have with your health care provider. Document Released: 06/06/2001 Document Revised: 02/25/2019 Document Reviewed: 02/25/2019 Elsevier Patient Education  2020 [employer]  

## 2019-09-20 ENCOUNTER — Encounter: Payer: Self-pay | Admitting: Neurology

## 2019-09-20 DIAGNOSIS — F0391 Unspecified dementia with behavioral disturbance: Secondary | ICD-10-CM | POA: Insufficient documentation

## 2019-09-20 DIAGNOSIS — F03918 Unspecified dementia, unspecified severity, with other behavioral disturbance: Secondary | ICD-10-CM | POA: Insufficient documentation

## 2019-09-23 DIAGNOSIS — N183 Chronic kidney disease, stage 3 (moderate): Secondary | ICD-10-CM | POA: Diagnosis not present

## 2019-09-23 DIAGNOSIS — R197 Diarrhea, unspecified: Secondary | ICD-10-CM | POA: Diagnosis not present

## 2019-09-23 DIAGNOSIS — M542 Cervicalgia: Secondary | ICD-10-CM | POA: Diagnosis not present

## 2019-09-23 DIAGNOSIS — R42 Dizziness and giddiness: Secondary | ICD-10-CM | POA: Diagnosis not present

## 2019-09-23 DIAGNOSIS — Z23 Encounter for immunization: Secondary | ICD-10-CM | POA: Diagnosis not present

## 2019-09-23 DIAGNOSIS — I129 Hypertensive chronic kidney disease with stage 1 through stage 4 chronic kidney disease, or unspecified chronic kidney disease: Secondary | ICD-10-CM | POA: Diagnosis not present

## 2019-09-25 LAB — B12 AND FOLATE PANEL
Folate: 8.6 ng/mL (ref >3.0)
Vitamin B-12: 268 pg/mL (ref 232–1245)

## 2019-09-25 LAB — COMPREHENSIVE METABOLIC PANEL
ALT: 10 IU/L (ref 0–32)
AST: 20 IU/L (ref 0–40)
Albumin/Globulin Ratio: 1.7 (ref 1.2–2.2)
Albumin: 4 g/dL (ref 3.6–4.6)
Alkaline Phosphatase: 72 IU/L (ref 39–117)
BUN/Creatinine Ratio: 16 (ref 12–28)
BUN: 17 mg/dL (ref 8–27)
Bilirubin Total: 0.3 mg/dL (ref 0.0–1.2)
CO2: 24 mmol/L (ref 20–29)
Calcium: 9.2 mg/dL (ref 8.7–10.3)
Chloride: 103 mmol/L (ref 96–106)
Creatinine, Ser: 1.06 mg/dL — ABNORMAL HIGH (ref 0.57–1.00)
GFR calc Af Amer: 56 mL/min/{1.73_m2} — ABNORMAL LOW (ref >59)
GFR calc non Af Amer: 48 mL/min/{1.73_m2} — ABNORMAL LOW (ref >59)
Globulin, Total: 2.3 g/dL (ref 1.5–4.5)
Glucose: 95 mg/dL (ref 65–99)
Potassium: 4 mmol/L (ref 3.5–5.2)
Sodium: 142 mmol/L (ref 134–144)
Total Protein: 6.3 g/dL (ref 6.0–8.5)

## 2019-09-25 LAB — HOMOCYSTEINE: Homocysteine: 21.2 umol/L (ref 0.0–21.3)

## 2019-09-25 LAB — CBC
Hematocrit: 38.4 % (ref 34.0–46.6)
Hemoglobin: 13.1 g/dL (ref 11.1–15.9)
MCH: 28.6 pg (ref 26.6–33.0)
MCHC: 34.1 g/dL (ref 31.5–35.7)
MCV: 84 fL (ref 79–97)
Platelets: 202 10*3/uL (ref 150–450)
RBC: 4.58 x10E6/uL (ref 3.77–5.28)
RDW: 13.3 % (ref 11.7–15.4)
WBC: 9.5 10*3/uL (ref 3.4–10.8)

## 2019-09-25 LAB — TSH: TSH: 3.5 u[IU]/mL (ref 0.450–4.500)

## 2019-09-25 LAB — RPR: RPR Ser Ql: NONREACTIVE

## 2019-09-25 LAB — METHYLMALONIC ACID, SERUM: Methylmalonic Acid: 246 nmol/L (ref 0–378)

## 2019-09-30 ENCOUNTER — Telehealth: Payer: Self-pay | Admitting: *Deleted

## 2019-09-30 NOTE — Telephone Encounter (Signed)
-----   Message from Melvenia Beam, MD sent at 09/26/2019  2:33 PM EDT ----- Labs unremarkable

## 2019-09-30 NOTE — Telephone Encounter (Signed)
Called pt's daughter Brain Hilts (on DPR) and LVM (ok per DPR) advising pt's labs are unremarkable, no concerns noted by Dr. Jaynee Eagles. Left office number for call back if any questions. Advised she can f/u as needed.

## 2019-10-01 ENCOUNTER — Emergency Department (HOSPITAL_COMMUNITY): Payer: Medicare HMO

## 2019-10-01 ENCOUNTER — Other Ambulatory Visit: Payer: Self-pay

## 2019-10-01 ENCOUNTER — Encounter (HOSPITAL_COMMUNITY): Payer: Self-pay

## 2019-10-01 ENCOUNTER — Emergency Department (HOSPITAL_COMMUNITY)
Admission: EM | Admit: 2019-10-01 | Discharge: 2019-10-01 | Disposition: A | Discharge status: Home / Self Care | Payer: Medicare HMO | Attending: Emergency Medicine | Admitting: Emergency Medicine

## 2019-10-01 DIAGNOSIS — F0391 Unspecified dementia with behavioral disturbance: Secondary | ICD-10-CM | POA: Diagnosis not present

## 2019-10-01 DIAGNOSIS — Z87891 Personal history of nicotine dependence: Secondary | ICD-10-CM | POA: Insufficient documentation

## 2019-10-01 DIAGNOSIS — N390 Urinary tract infection, site not specified: Secondary | ICD-10-CM | POA: Insufficient documentation

## 2019-10-01 DIAGNOSIS — Z79899 Other long term (current) drug therapy: Secondary | ICD-10-CM | POA: Insufficient documentation

## 2019-10-01 DIAGNOSIS — G9340 Encephalopathy, unspecified: Secondary | ICD-10-CM | POA: Diagnosis not present

## 2019-10-01 DIAGNOSIS — Z96651 Presence of right artificial knee joint: Secondary | ICD-10-CM | POA: Diagnosis not present

## 2019-10-01 DIAGNOSIS — I10 Essential (primary) hypertension: Secondary | ICD-10-CM | POA: Diagnosis not present

## 2019-10-01 DIAGNOSIS — R410 Disorientation, unspecified: Secondary | ICD-10-CM | POA: Diagnosis present

## 2019-10-01 DIAGNOSIS — Z7982 Long term (current) use of aspirin: Secondary | ICD-10-CM | POA: Insufficient documentation

## 2019-10-01 LAB — BASIC METABOLIC PANEL
Anion gap: 9 (ref 5–15)
BUN: 19 mg/dL (ref 8–23)
CO2: 25 mmol/L (ref 22–32)
Calcium: 9.4 mg/dL (ref 8.9–10.3)
Chloride: 104 mmol/L (ref 98–111)
Creatinine, Ser: 0.91 mg/dL (ref 0.44–1.00)
GFR calc Af Amer: >60 mL/min (ref >60)
GFR calc non Af Amer: 58 mL/min — ABNORMAL LOW (ref >60)
Glucose, Bld: 102 mg/dL — ABNORMAL HIGH (ref 70–99)
Potassium: 4.1 mmol/L (ref 3.5–5.1)
Sodium: 138 mmol/L (ref 135–145)

## 2019-10-01 LAB — URINALYSIS, ROUTINE W REFLEX MICROSCOPIC
Bilirubin Urine: NEGATIVE
Glucose, UA: NEGATIVE mg/dL
Hgb urine dipstick: NEGATIVE
Ketones, ur: NEGATIVE mg/dL
Nitrite: NEGATIVE
Protein, ur: NEGATIVE mg/dL
Specific Gravity, Urine: 1.015 (ref 1.005–1.030)
pH: 8 (ref 5.0–8.0)

## 2019-10-01 LAB — CBC
HCT: 38 % (ref 36.0–46.0)
Hemoglobin: 12.4 g/dL (ref 12.0–15.0)
MCH: 29.1 pg (ref 26.0–34.0)
MCHC: 32.6 g/dL (ref 30.0–36.0)
MCV: 89.2 fL (ref 80.0–100.0)
Platelets: 185 10*3/uL (ref 150–400)
RBC: 4.26 MIL/uL (ref 3.87–5.11)
RDW: 13 % (ref 11.5–15.5)
WBC: 9.4 10*3/uL (ref 4.0–10.5)
nRBC: 0 % (ref 0.0–0.2)

## 2019-10-01 MED ORDER — CEPHALEXIN 500 MG PO CAPS: 500.0000 mg | ORAL_CAPSULE | Freq: Three times a day (TID) | ORAL | 0 refills | Status: DC

## 2019-10-01 MED ORDER — CEFTRIAXONE SODIUM 1 G IJ SOLR: 1.0000 g | Freq: Once | INTRAMUSCULAR | Status: DC

## 2019-10-01 MED ORDER — SODIUM CHLORIDE 0.9 % IV SOLN
1.0000 g | INTRAVENOUS | Status: DC
  Administered 2019-10-01: 1 g via INTRAVENOUS
  Filled 2019-10-01: qty 10

## 2019-10-01 MED ORDER — SODIUM CHLORIDE 0.9% FLUSH: 3.0000 mL | Freq: Once | INTRAVENOUS | Status: DC

## 2019-10-01 NOTE — ED Triage Notes (Signed)
Patient brought to ED by daughter for humming in ears and confusion. Yesterday confusion all day and worse during the night. Seeing things and reporting things to family that were not happening. Lives with daughter

## 2019-10-01 NOTE — ED Provider Notes (Signed)
[facility] Provider Note   CSN: [id number] Arrival date & time: 10/01/19  1553     History   Chief Complaint No chief complaint on file.   HPI Cynthia Guzman is a 85 y.o. female.     85 year old female recently diagnosed with dementia who presents with her daughter with increased confusion.  No cough or congestion.  No shortness of breath.  Confusion does appear to be worse at night.  Seen by her neurologist about 2 weeks ago and asked when the diagnosis of dementia was made.  She has not been started on any medications.  Daughter denies any history of trauma.  No emesis noted.  No new medication started.     Past Medical History:  Diagnosis Date  . Chronic back pain   . Complication of anesthesia    hard to wake up  . Depression   . eczema   . Glaucoma   . Hearing impairment   . Hypertension   . Memory impairment   . Osteoporosis     Patient Active Problem List   Diagnosis Date Noted  . Dementia with behavioral disturbance ([place]) 09/20/2019  . CAP (community acquired pneumonia) 10/25/2018  . Essential hypertension 10/25/2018  . Dizziness 10/25/2018  . Hypertensive urgency 10/25/2018  . Chronic back pain 10/25/2018  . HLD (hyperlipidemia) 10/25/2018  . Postop Acute blood loss anemia 11/19/2012  . OA (osteoarthritis) of knee 11/18/2012    Past Surgical History:  Procedure Laterality Date  . ABDOMINAL HYSTERECTOMY    . BACK SURGERY     x 3  . DILATION AND CURETTAGE OF UTERUS    . EYE SURGERY     bilateral cataracts with lens implants  . HAND SURGERY     left hand surgery  . TOTAL KNEE ARTHROPLASTY  11/18/2012   Procedure: TOTAL KNEE ARTHROPLASTY;  Surgeon: Gearlean Alf, MD;  Location: [facility];  Service: Orthopedics;  Laterality: Right;     OB History   No obstetric history on file.      Home Medications    Prior to Admission medications   Medication Sig Start Date End Date Taking? Authorizing Provider   amLODipine (NORVASC) 5 MG tablet Take 1 tablet (5 mg total) by mouth at bedtime. Patient not taking: Reported on 09/18/2019 10/27/18   Jonetta Osgood, MD  aspirin EC 81 MG tablet Take 81 mg by mouth daily.    [provider]  buprenorphine (BUTRANS) 10 MCG/HR PTWK patch APPLY 1 PATCH ONCE PER WEEK 08/27/19   [provider]  Cholecalciferol (VITAMIN D) 2000 units CAPS Take 2,000 Units by mouth daily.    [provider]  dorzolamide-timolol (COSOPT) 22.3-6.8 MG/ML ophthalmic solution Place 1 drop into both eyes 2 (two) times daily.    [provider]  latanoprost (XALATAN) 0.005 % ophthalmic solution Place 1 drop into both eyes at bedtime.    [provider]  methocarbamol (ROBAXIN) 500 MG tablet Take 1 tablet (500 mg total) by mouth every 6 (six) hours as needed. Patient not taking: Reported on 10/25/2018 11/20/12   Dara Lords, Alexzandrew L, PA-C  metoprolol (LOPRESSOR) 50 MG tablet Take 50 mg by mouth daily.     [provider]  morphine (MSIR) 30 MG tablet Take 30 mg by mouth every 6 (six) hours as needed for moderate pain. Pain    [provider]  ondansetron (ZOFRAN) 4 MG tablet Take 1 tablet (4 mg total) by mouth every 8 (  eight) hours as needed for nausea or vomiting. Patient not taking: Reported on 09/18/2019 11/05/18   Carlyle BasquesHernandez, Ana P, PA-C  oxyCODONE (OXY IR/ROXICODONE) 5 MG immediate release tablet Take 1-2 tablets (5-10 mg total) by mouth every 3 (three) hours as needed. Patient not taking: Reported on 10/25/2018 11/20/12   Julien GirtPerkins, Alexzandrew L, PA-C  Probiotic Product (ALIGN PO) Take by mouth.    [provider]  promethazine (PHENERGAN) 25 MG tablet Take 25 mg by mouth 4 (four) times daily.    [provider]  sertraline (ZOLOFT) 100 MG tablet Take 150 mg by mouth at bedtime.     [provider]  simvastatin (ZOCOR) 20 MG tablet Take 20 mg by mouth at bedtime.     [provider]   telmisartan (MICARDIS) 40 MG tablet Take 40 mg by mouth daily. 08/03/19   [provider]  zoledronic acid (RECLAST) 5 MG/100ML SOLN injection Inject 5 mg into the vein once.    [provider]    Family History Family History  Problem Relation Age of Onset  . Alzheimer's disease Other        "all of her aunts and her mother" per daughter    Social History Social History   Tobacco Use  . Smoking status: Former Smoker    Packs/day: 1.00    Years: 60.00    Pack years: 60.00    Types: Cigarettes    Quit date: 12/26/2015    Years since quitting: 3.7  . Smokeless tobacco: Never Used  Substance Use Topics  . Alcohol use: No  . Drug use: No     Allergies   Arthritis pain regimen [aspirin]   Review of Systems Review of Systems  All other systems reviewed and are negative.    Physical Exam Updated Vital Signs BP (!) 152/57 (BP Location: Right Arm)   Pulse (!) 58   Temp 99 F (37.2 C) (Oral)   Resp 18   SpO2 92%   Physical Exam Vitals signs and nursing note reviewed.  Constitutional:      General: She is not in acute distress.    Appearance: Normal appearance. She is well-developed. She is not toxic-appearing.  HENT:     Head: Normocephalic and atraumatic.  Eyes:     General: Lids are normal.     Conjunctiva/sclera: Conjunctivae normal.     Pupils: Pupils are equal, round, and reactive to light.  Neck:     Musculoskeletal: Normal range of motion and neck supple.     Thyroid: No thyroid mass.     Trachea: No tracheal deviation.  Cardiovascular:     Rate and Rhythm: Normal rate and regular rhythm.     Heart sounds: Normal heart sounds. No murmur. No gallop.   Pulmonary:     Effort: Pulmonary effort is normal. No respiratory distress.     Breath sounds: Normal breath sounds. No stridor. No decreased breath sounds, wheezing, rhonchi or rales.  Abdominal:     General: Bowel sounds are normal. There is no distension.     Palpations: Abdomen is  soft.     Tenderness: There is no abdominal tenderness. There is no rebound.  Musculoskeletal: Normal range of motion.        General: No tenderness.  Skin:    General: Skin is warm and dry.     Findings: No abrasion or rash.  Neurological:     Mental Status: She is alert and oriented to person, place, and time.  GCS: GCS eye subscore is 4. GCS verbal subscore is 5. GCS motor subscore is 6.     Cranial Nerves: No cranial nerve deficit, dysarthria or facial asymmetry.     Sensory: No sensory deficit.     Motor: No weakness or tremor.     Comments: Strength is 5/5 in upper as well as lower extremities.  Psychiatric:        Attention and Perception: Attention normal.        Speech: Speech normal.        Behavior: Behavior normal.      ED Treatments / Results  Labs (all labs ordered are listed, but only abnormal results are displayed) Labs Reviewed  BASIC METABOLIC PANEL - Abnormal; Notable for the following components:      Result Value   Glucose, Bld 102 (*)    GFR calc non Af Amer 58 (*)    All other components within normal limits  CBC  URINALYSIS, ROUTINE W REFLEX MICROSCOPIC    EKG None  Radiology No results found.  Procedures Procedures (including critical care time)  Medications Ordered in ED Medications  sodium chloride flush (NS) 0.9 % injection 3 mL (has no administration in time range)     Initial Impression / Assessment and Plan / ED Course  I have reviewed the triage vital signs and the nursing notes.  Pertinent labs & imaging results that were available during my care of the patient were reviewed by me and considered in my medical decision making (see chart for details).        Head CT negative.  Urinalysis consistent with infection.  Patient given dose of antibiotics here.  We placed on Keflex urine culture sent.  Stable for discharge  Final Clinical Impressions(s) / ED Diagnoses   Final diagnoses:  None    ED Discharge Orders    None        Lorre Nick, MD 10/01/19 2018

## 2019-10-02 LAB — URINE CULTURE: Culture: NO GROWTH

## 2019-10-07 DIAGNOSIS — G309 Alzheimer's disease, unspecified: Secondary | ICD-10-CM | POA: Diagnosis not present

## 2019-10-07 DIAGNOSIS — I129 Hypertensive chronic kidney disease with stage 1 through stage 4 chronic kidney disease, or unspecified chronic kidney disease: Secondary | ICD-10-CM | POA: Diagnosis not present

## 2019-10-07 DIAGNOSIS — N183 Chronic kidney disease, stage 3 unspecified: Secondary | ICD-10-CM | POA: Diagnosis not present

## 2019-10-07 DIAGNOSIS — F558 Abuse of other non-psychoactive substances: Secondary | ICD-10-CM | POA: Diagnosis not present

## 2019-10-07 DIAGNOSIS — R0989 Other specified symptoms and signs involving the circulatory and respiratory systems: Secondary | ICD-10-CM | POA: Diagnosis not present

## 2019-10-07 DIAGNOSIS — H9313 Tinnitus, bilateral: Secondary | ICD-10-CM | POA: Diagnosis not present

## 2019-10-08 ENCOUNTER — Telehealth (HOSPITAL_COMMUNITY): Payer: Self-pay | Admitting: *Deleted

## 2019-10-08 NOTE — Telephone Encounter (Signed)
Left VM for daughter to schedule appt for mother

## 2019-10-10 ENCOUNTER — Telehealth (HOSPITAL_COMMUNITY): Payer: Self-pay | Admitting: *Deleted

## 2019-10-10 NOTE — Telephone Encounter (Signed)
Scheduled appointment with daughter.  No to all COVID questions at this time.

## 2019-10-17 ENCOUNTER — Ambulatory Visit (HOSPITAL_COMMUNITY)
Admission: RE | Admit: 2019-10-17 | Discharge: 2019-10-17 | Disposition: A | Discharge status: Home / Self Care | Payer: Medicare HMO | Source: Ambulatory Visit | Attending: Family | Admitting: Family

## 2019-10-17 ENCOUNTER — Other Ambulatory Visit: Payer: Self-pay

## 2019-10-17 ENCOUNTER — Other Ambulatory Visit (HOSPITAL_COMMUNITY): Payer: Self-pay | Admitting: Internal Medicine

## 2019-10-17 DIAGNOSIS — R0989 Other specified symptoms and signs involving the circulatory and respiratory systems: Secondary | ICD-10-CM

## 2019-11-17 IMAGING — CT CT HEAD WITHOUT CONTRAST
4 series · 17 of 47 positions shown, 19 images · non-contrast
Comparison: CT scan dated 10/25/2018

CLINICAL DATA: Altered mental status since a fall 3 days ago at
home. Abrasion on the left side of the head.

EXAM:
CT HEAD WITHOUT CONTRAST
TECHNIQUE: Contiguous axial images were obtained from the base of the skull
through the vertex without intravenous contrast.

[Series 3: head without · axial · non-contrast · 0.41mm/px · z∈[-124,-4]mm · 7 of 32 slices shown, 9 images]
[im 4/32  brain]
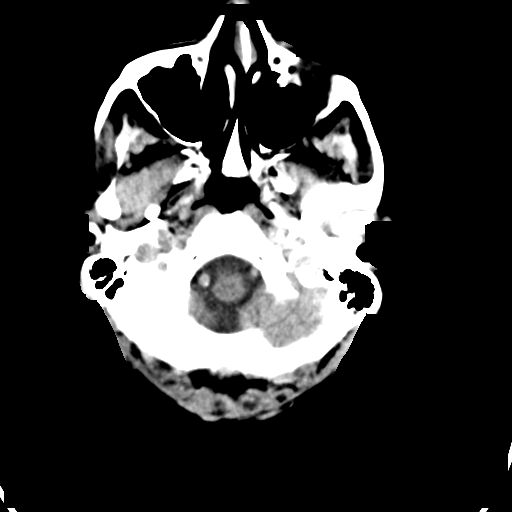
[im 4/32  bone]
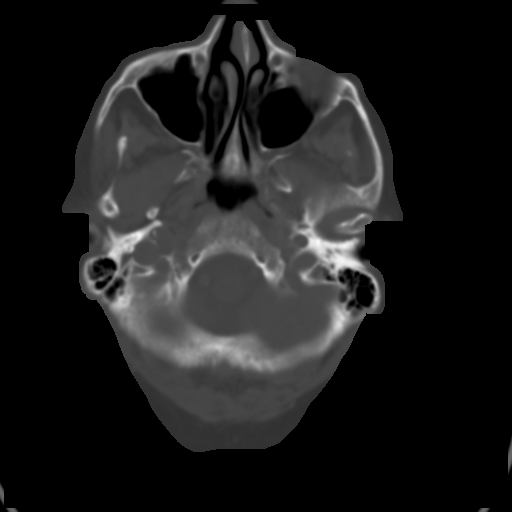
[im 8/32  brain]
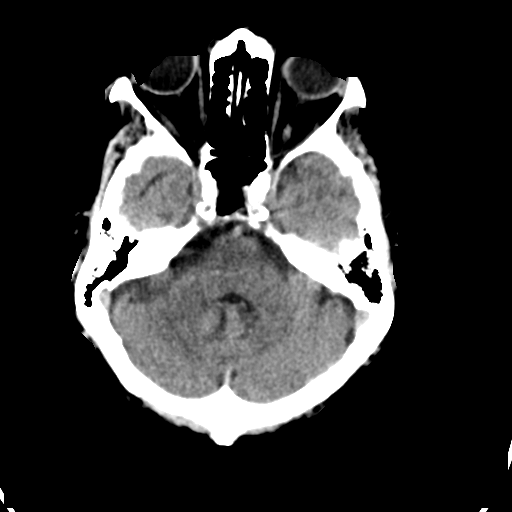
[im 12/32  brain]
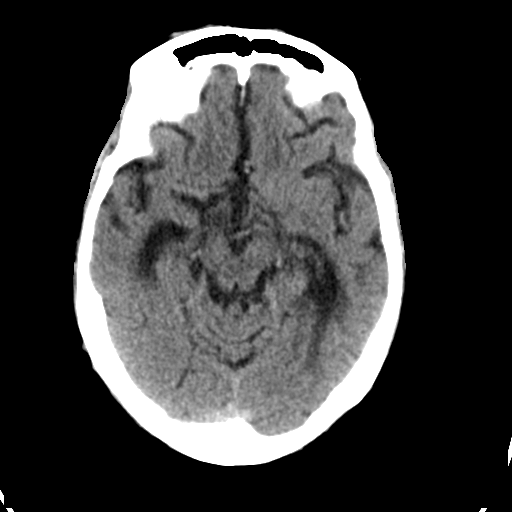
[im 16/32  brain]
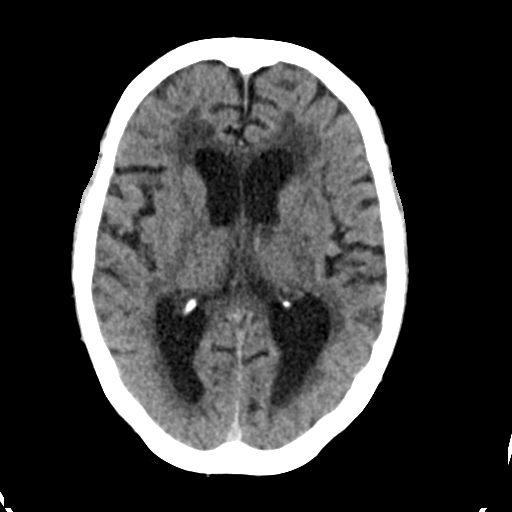
[im 20/32  brain]
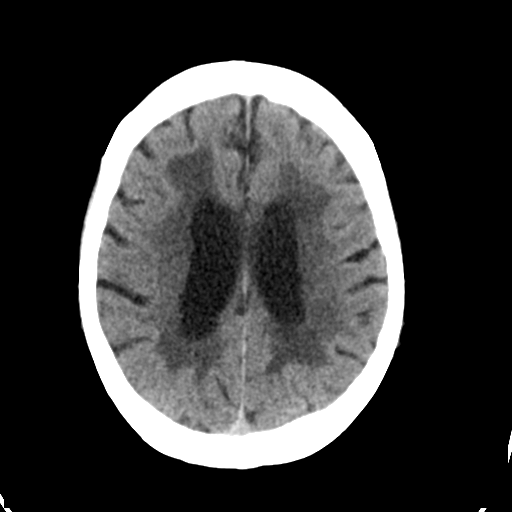
[im 20/32  bone]
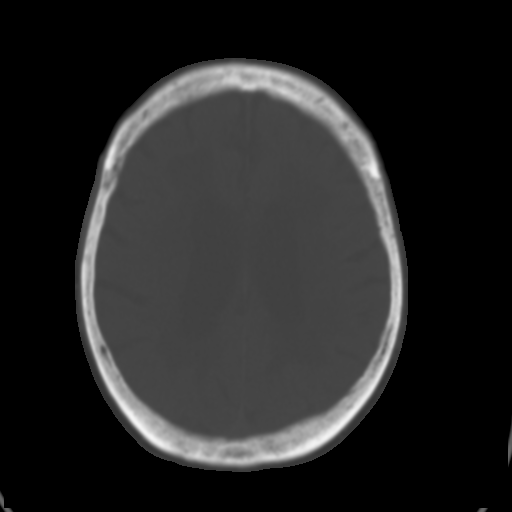
[im 24/32  brain]
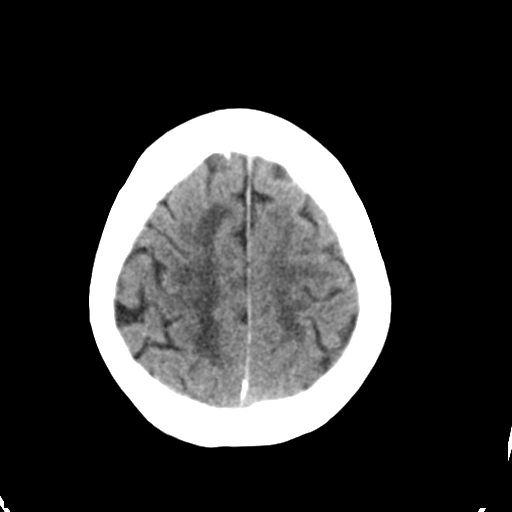
[im 28/32  brain]
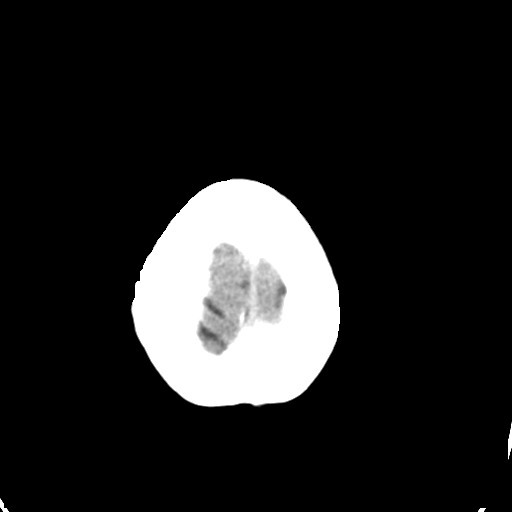

[Series 4: head bone · axial · 0.41mm/px · z∈[-125,-69]mm · 4 of 79 slices shown]
[im 8/79  bone]
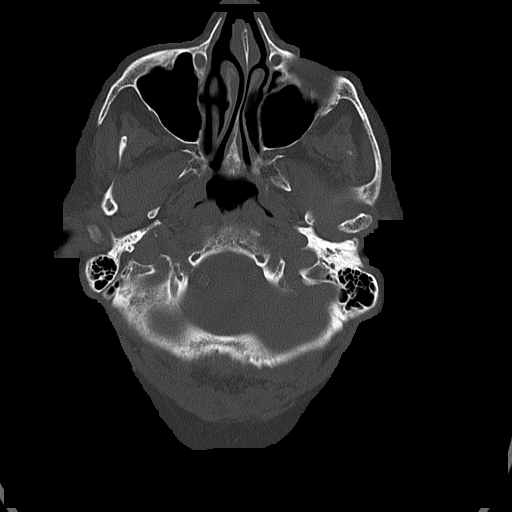
[im 16/79  bone]
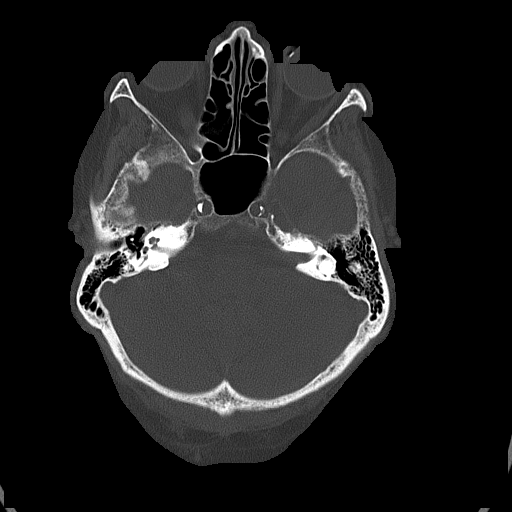
[im 24/79  bone]
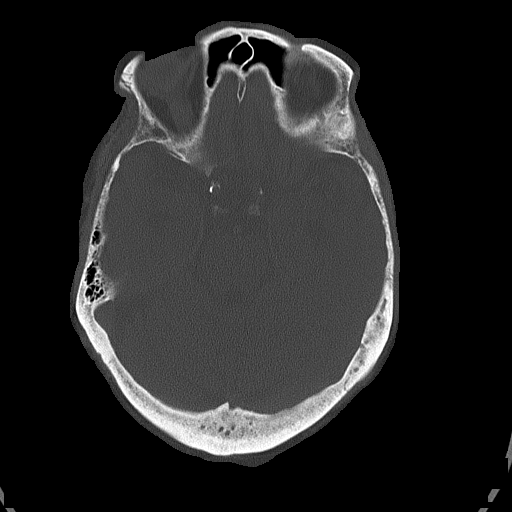
[im 36/79  bone]
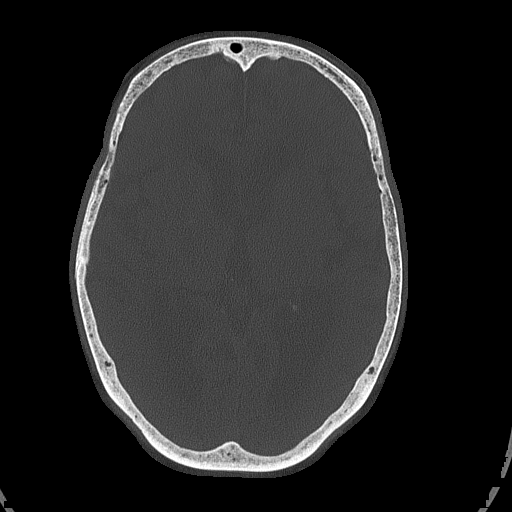

[Series 5: head without cor · coronal · non-contrast · 0.33mm/px · 3 of 67 slices shown]
[im 23/67  brain]
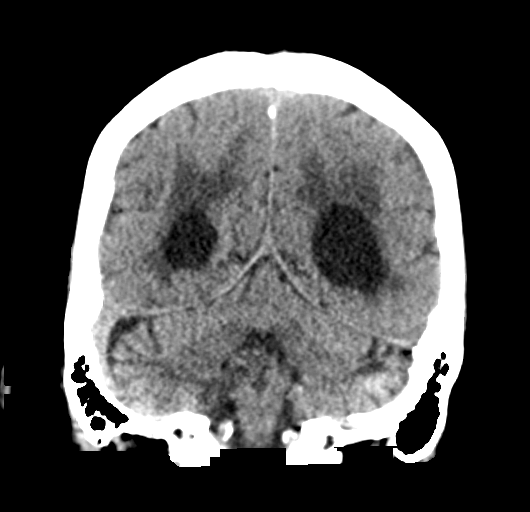
[im 30/67  brain]
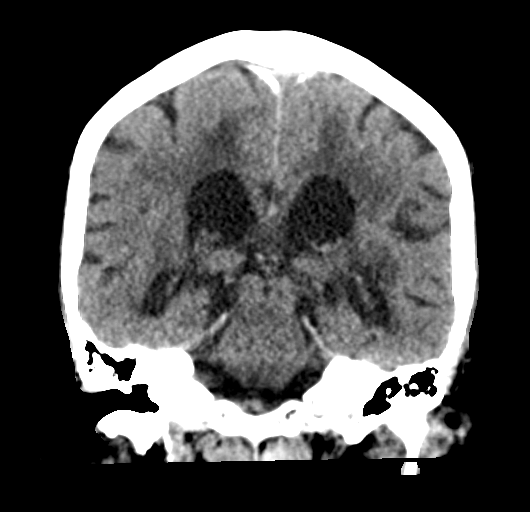
[im 37/67  brain]
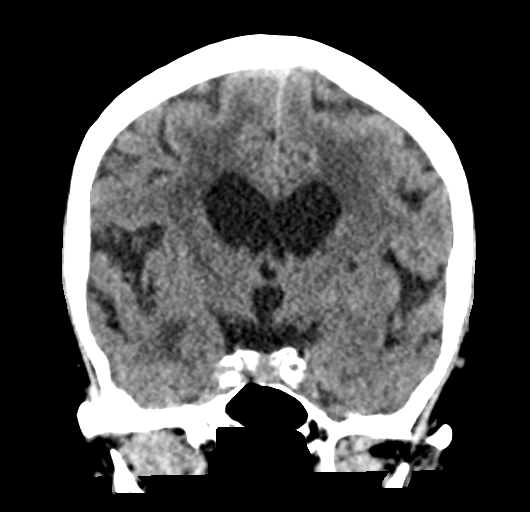

[Series 6: head without sag · sagittal · non-contrast · 0.31mm/px · 3 of 58 slices shown]
[im 20/58  brain]
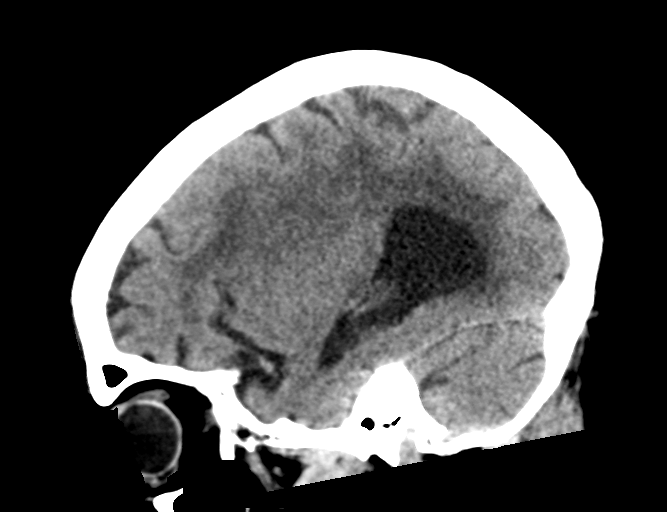
[im 29/58  brain]
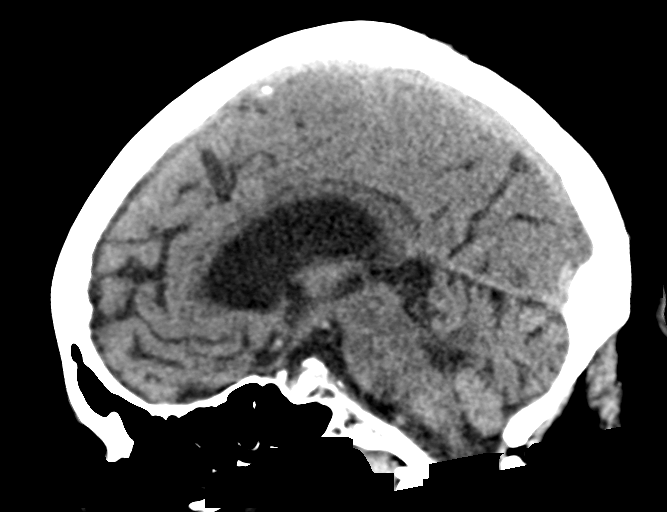
[im 39/58  brain]
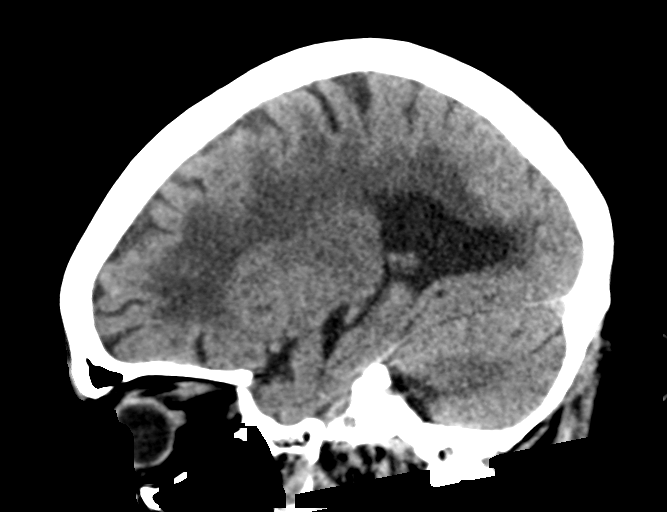

[17 of 47 positions shown; findings below may reference images not displayed]

FINDINGS: Brain: There is no acute intracranial hemorrhage or infarction or
mass lesion. There is diffuse cerebral cortical atrophy with
secondary ventricular dilatation, unchanged. There is extensive
periventricular white matter lucency as well as a few small old
lacunar infarcts in the basal ganglia, all unchanged since the prior
study.

Vascular: No hyperdense vessel or unexpected calcification.

Skull: Normal. Negative for fracture or focal lesion.

Sinuses/Orbits: Normal.

Other: None
IMPRESSION: No acute intracranial abnormality. Atrophy with extensive chronic
small vessel ischemic changes.

## 2019-11-17 IMAGING — CR CHEST - 2 VIEW
2 series · 2 of 2 positions shown · non-contrast
Comparison: Chest x-ray dated 10/25/2018.

CLINICAL DATA: Altered mental status for a day. History of
hypertension. Former smoker.

EXAM:
CHEST - 2 VIEW

[chest lat]
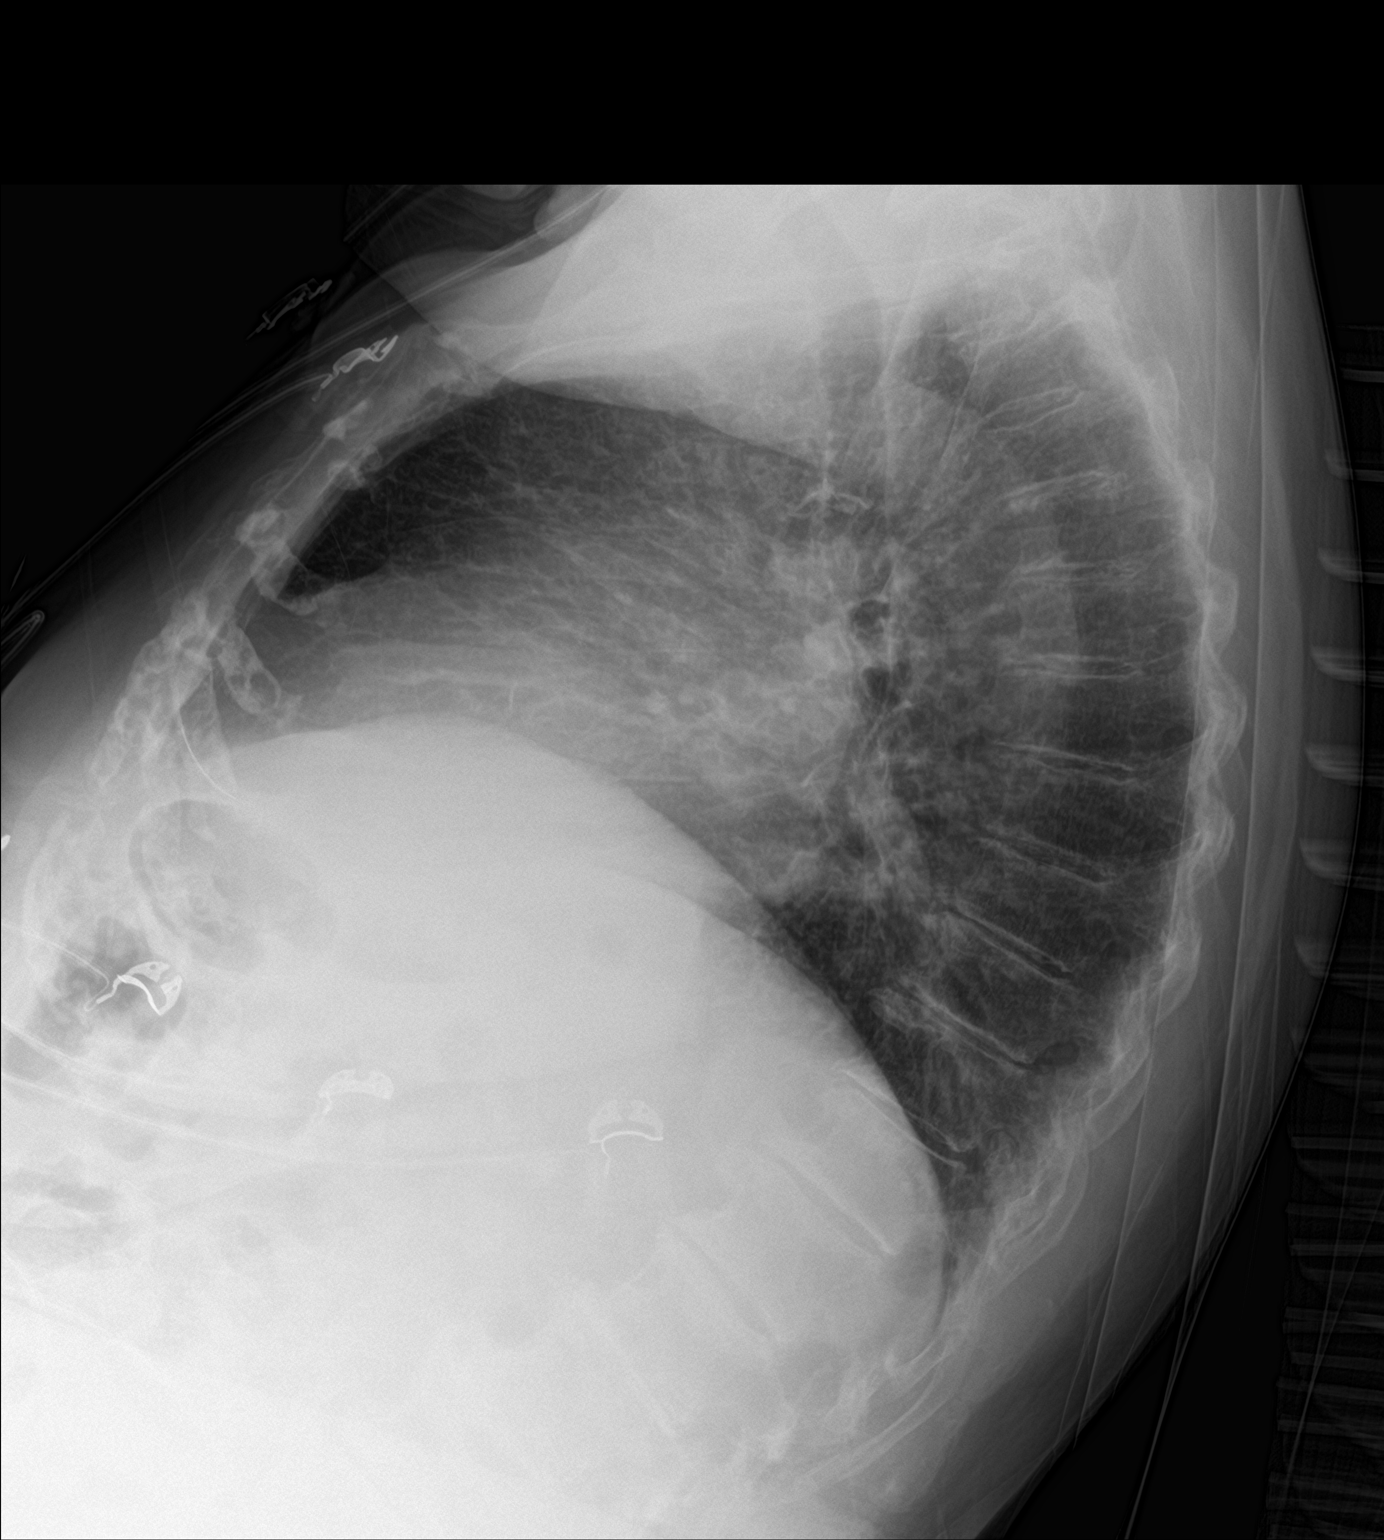

[chest ap]
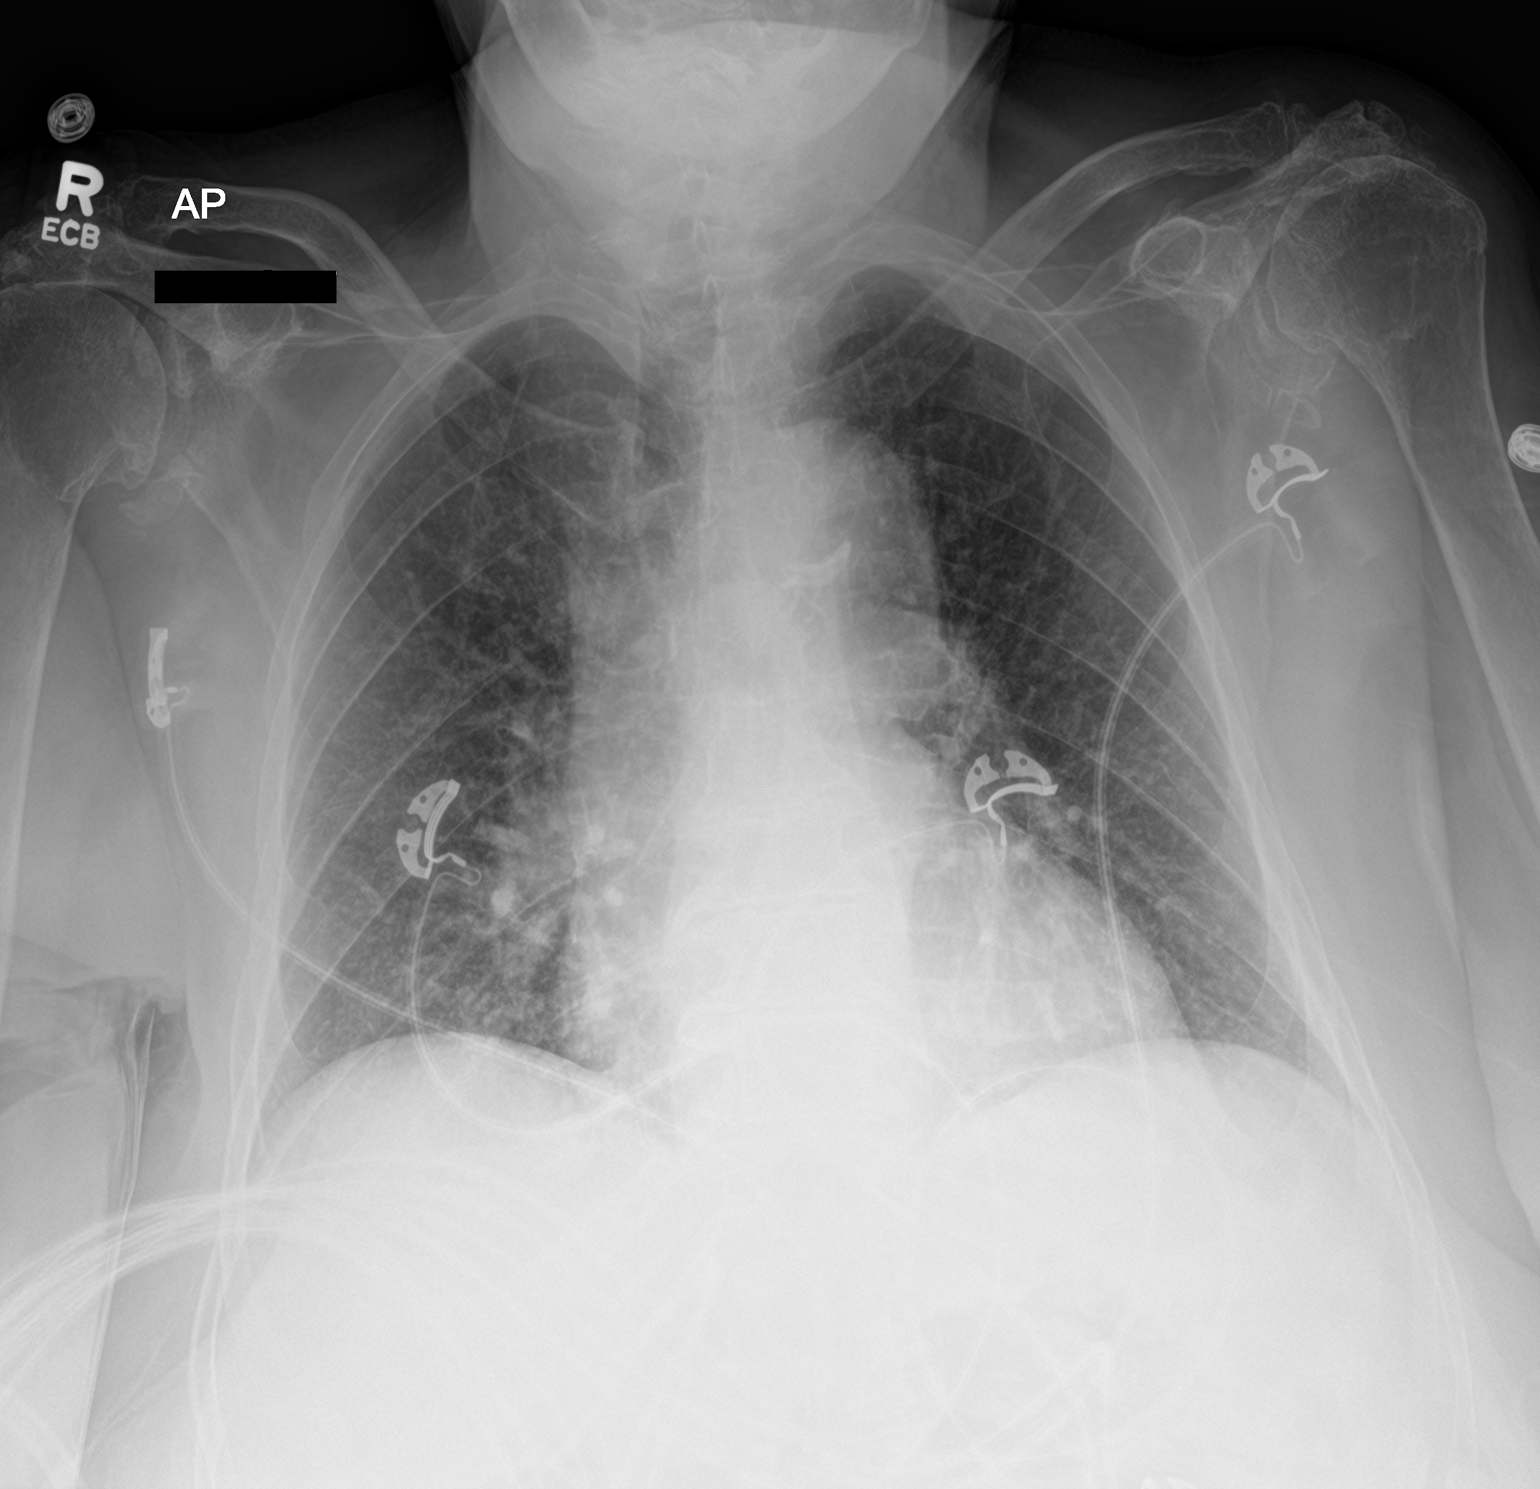

[2 of 2 positions shown; findings below may reference images not displayed]

FINDINGS: Stable mild cardiomegaly. Overall cardiomediastinal silhouette is
stable. Central pulmonary vascular congestion. No confluent opacity
to suggest consolidating pneumonia or overt alveolar pulmonary
edema. No pleural effusion or pneumothorax seen.

Stable kyphosis of the thoracic spine with associated mild
degenerative spondylosis throughout. No acute or suspicious osseous
finding.
IMPRESSION: 1. Cardiomegaly with central pulmonary vascular congestion
suggesting mild CHF/volume overload.
2. No evidence of pneumonia or pulmonary edema.

## 2019-11-26 DIAGNOSIS — Z79899 Other long term (current) drug therapy: Secondary | ICD-10-CM | POA: Diagnosis not present

## 2019-11-26 DIAGNOSIS — G894 Chronic pain syndrome: Secondary | ICD-10-CM | POA: Diagnosis not present

## 2019-11-26 DIAGNOSIS — M47816 Spondylosis without myelopathy or radiculopathy, lumbar region: Secondary | ICD-10-CM | POA: Diagnosis not present

## 2019-11-28 IMAGING — CT CT RENAL STONE PROTOCOL
2 of 4 series · 17 of 46 positions shown, 19 images · non-contrast
Comparison: CT abdomen pelvis dated November 05, 2018.

CLINICAL DATA: Severe lower back pain.

EXAM:
CT ABDOMEN AND PELVIS WITHOUT CONTRAST
TECHNIQUE: Multidetector CT imaging of the abdomen and pelvis was performed
following the standard protocol without IV contrast.

[Series 4: renal stone 5.0 · axial · 0.88mm/px · z∈[+807,+1212]mm · 14 of 89 slices shown, 16 images]
[im 4/89  soft-tissue]
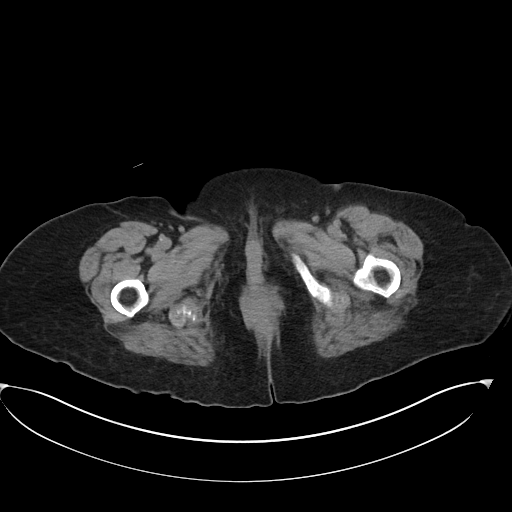
[im 4/89  bone]
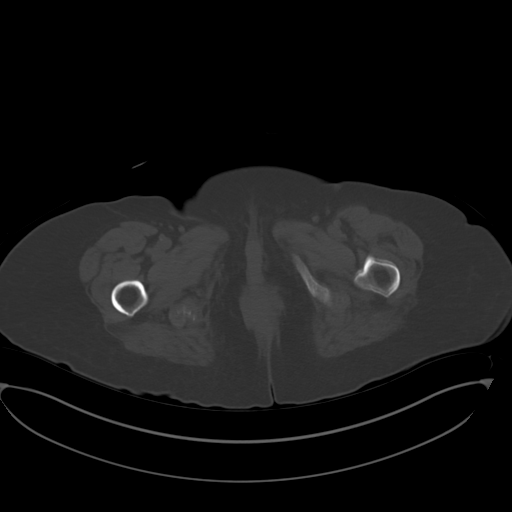
[im 12/89  soft-tissue]
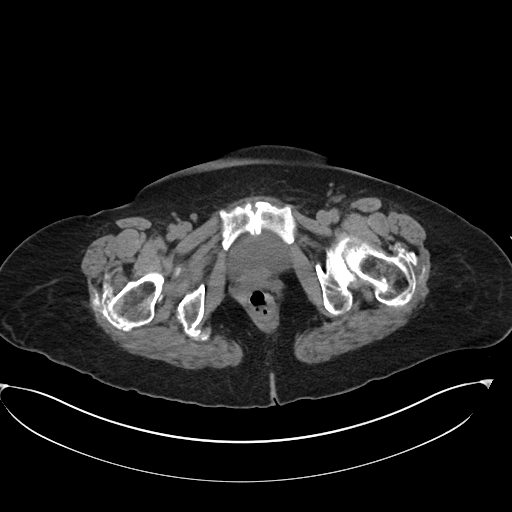
[im 16/89  soft-tissue]
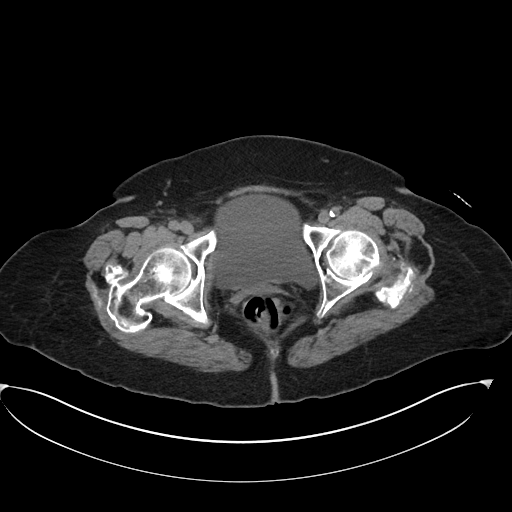
[im 23/89  soft-tissue]
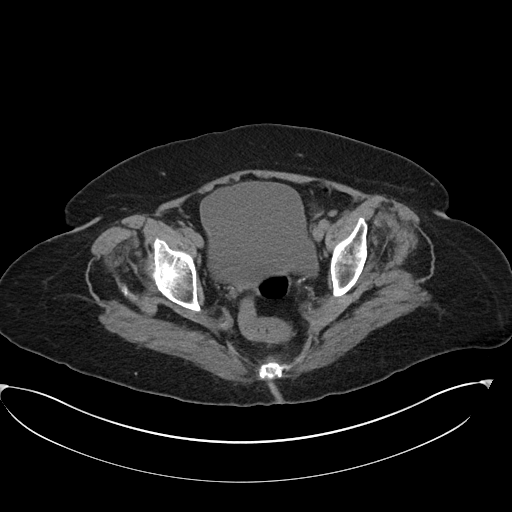
[im 31/89  soft-tissue]
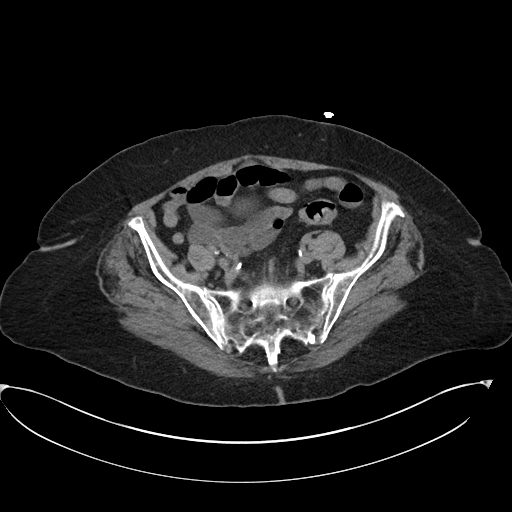
[im 35/89  soft-tissue]
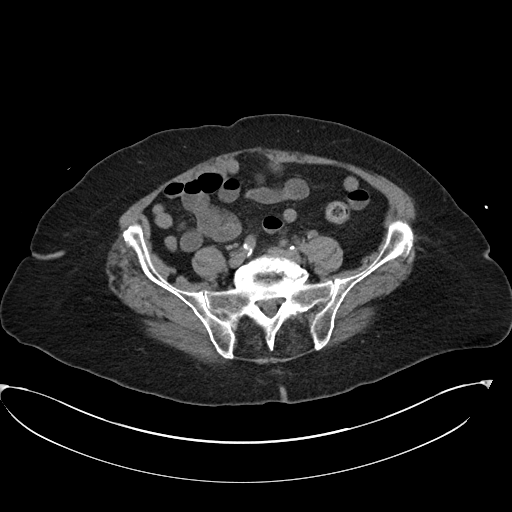
[im 43/89  soft-tissue]
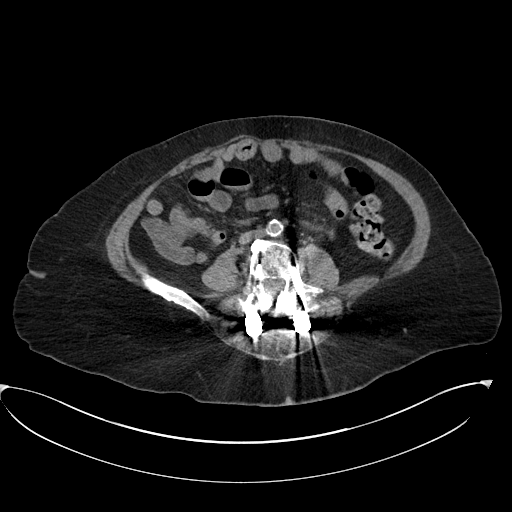
[im 46/89  soft-tissue]
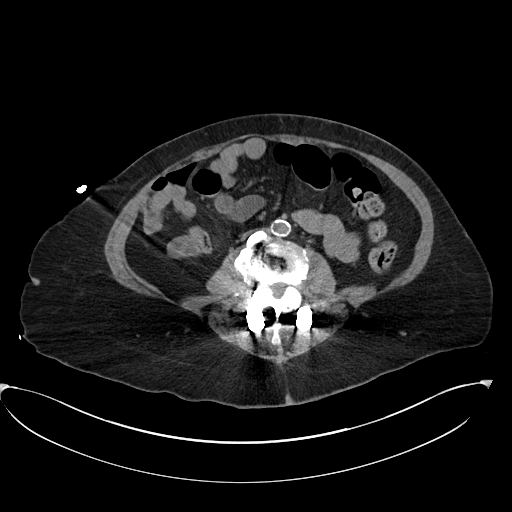
[im 54/89  soft-tissue]
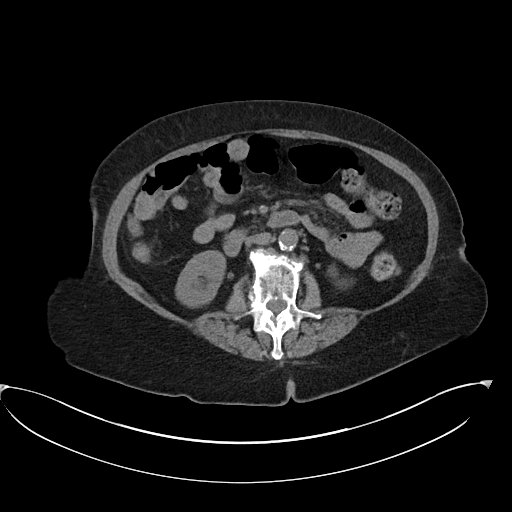
[im 54/89  bone]
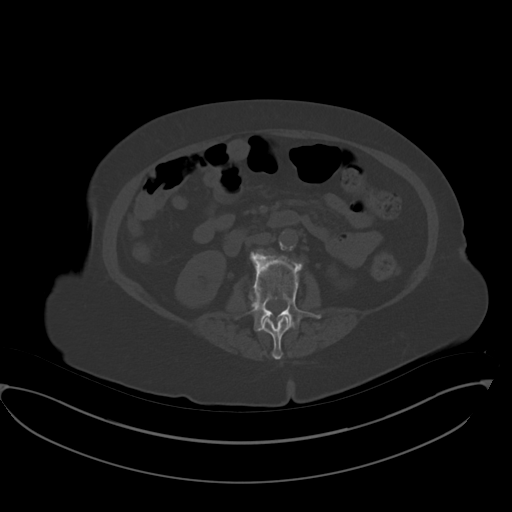
[im 58/89  soft-tissue]
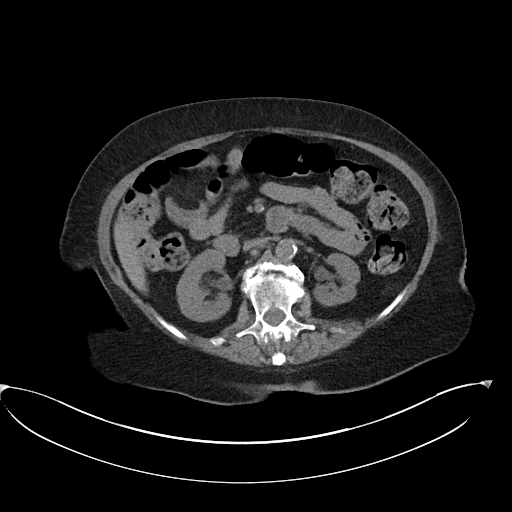
[im 66/89  soft-tissue]
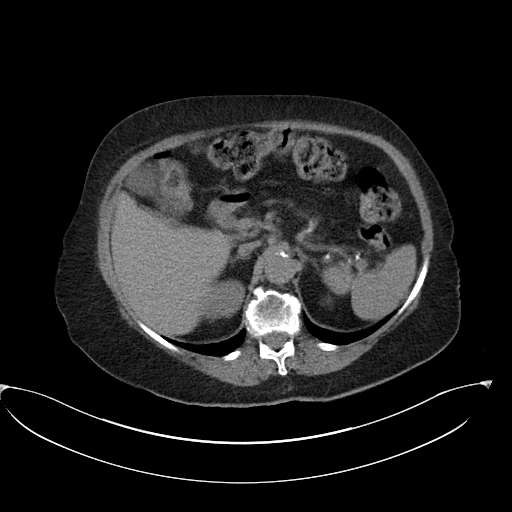
[im 73/89  soft-tissue]
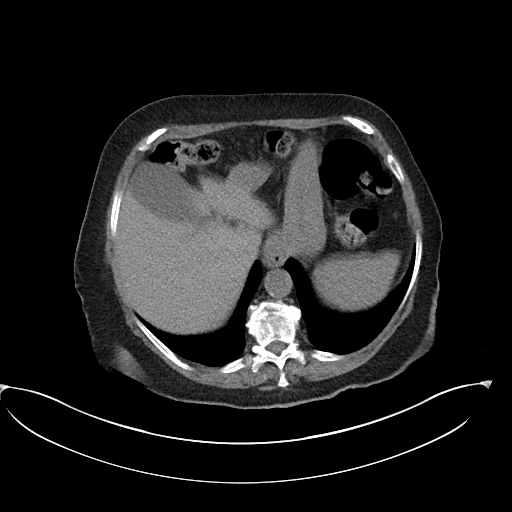
[im 77/89  soft-tissue]
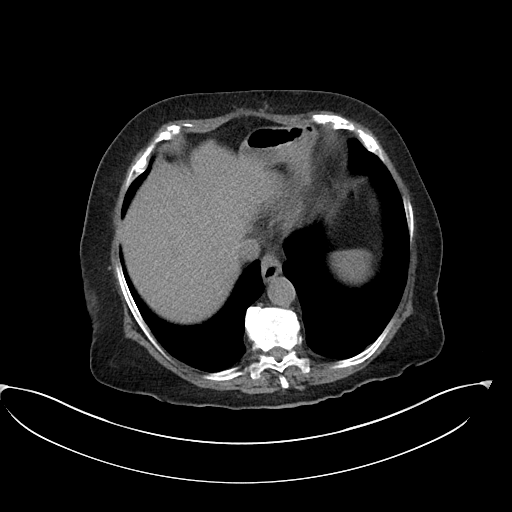
[im 85/89  soft-tissue]
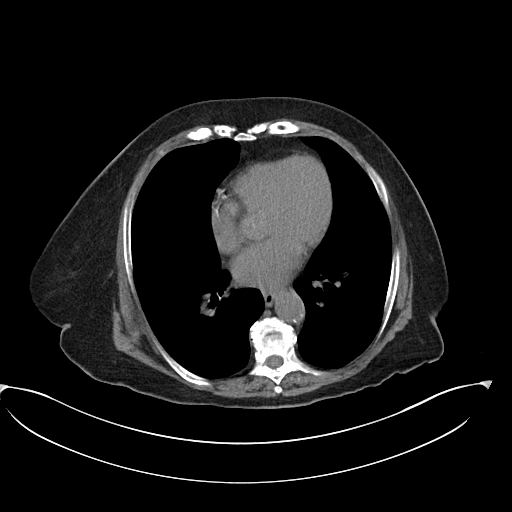

[Series 6: renal stone 3.0 cor · coronal · 0.87mm/px · 3 of 93 slices shown]
[im 31/93  soft-tissue]
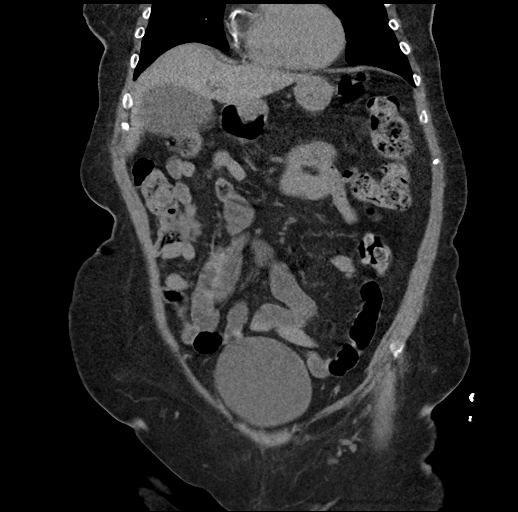
[im 41/93  soft-tissue]
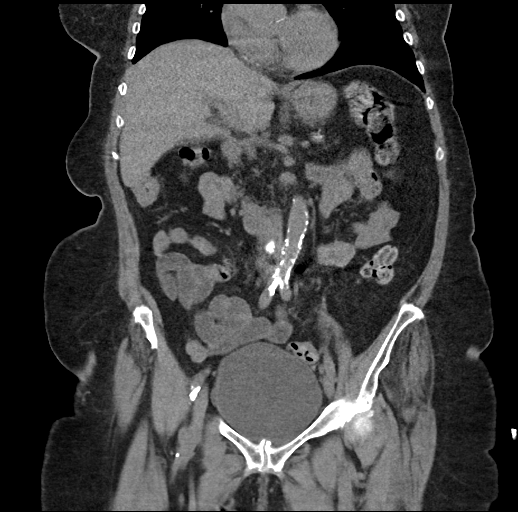
[im 52/93  soft-tissue]
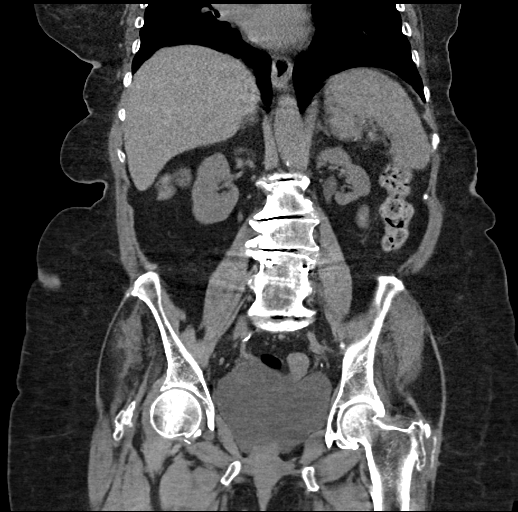

[17 of 46 positions shown; findings below may reference images not displayed]

FINDINGS: Lower chest: No acute abnormality.

Hepatobiliary: No focal liver abnormality is seen. No gallstones,
gallbladder wall thickening, or biliary dilatation.

Pancreas: Severe fatty atrophy. No ductal dilatation or surrounding
inflammatory changes.

Spleen: Normal in size without focal abnormality.

Adrenals/Urinary Tract: The adrenal glands are unremarkable.
Unchanged scarring and atrophy of the left kidney. No renal calculi
or hydronephrosis. The bladder is unremarkable.

Stomach/Bowel: Stomach is within normal limits. Appendix is not
visualized, but there are no signs of inflammation at the base of
the cecum. No evidence of bowel wall thickening, distention, or
inflammatory changes.

Vascular/Lymphatic: Aortic atherosclerosis. No enlarged abdominal or
pelvic lymph nodes.

Reproductive: Status post hysterectomy. No adnexal masses.

Other: No abdominal wall hernia or abnormality. No abdominopelvic
ascites. No pneumoperitoneum.

Musculoskeletal: No acute or significant osseous findings. Severe
thoracolumbar spondylosis. Prior L4-L5 fusion.
IMPRESSION: 1.  No acute intra-abdominal process.  No urolithiasis.
2.  Aortic atherosclerosis ([id number]).

## 2019-12-08 DIAGNOSIS — M810 Age-related osteoporosis without current pathological fracture: Secondary | ICD-10-CM | POA: Diagnosis not present

## 2019-12-08 DIAGNOSIS — E559 Vitamin D deficiency, unspecified: Secondary | ICD-10-CM | POA: Diagnosis not present

## 2019-12-08 DIAGNOSIS — H9193 Unspecified hearing loss, bilateral: Secondary | ICD-10-CM | POA: Diagnosis not present

## 2019-12-08 DIAGNOSIS — F558 Abuse of other non-psychoactive substances: Secondary | ICD-10-CM | POA: Diagnosis not present

## 2019-12-08 DIAGNOSIS — E7800 Pure hypercholesterolemia, unspecified: Secondary | ICD-10-CM | POA: Diagnosis not present

## 2019-12-08 DIAGNOSIS — I129 Hypertensive chronic kidney disease with stage 1 through stage 4 chronic kidney disease, or unspecified chronic kidney disease: Secondary | ICD-10-CM | POA: Diagnosis not present

## 2019-12-08 DIAGNOSIS — N183 Chronic kidney disease, stage 3 unspecified: Secondary | ICD-10-CM | POA: Diagnosis not present

## 2019-12-08 DIAGNOSIS — I739 Peripheral vascular disease, unspecified: Secondary | ICD-10-CM | POA: Diagnosis not present

## 2019-12-08 DIAGNOSIS — R600 Localized edema: Secondary | ICD-10-CM | POA: Diagnosis not present

## 2019-12-09 DIAGNOSIS — N183 Chronic kidney disease, stage 3 unspecified: Secondary | ICD-10-CM | POA: Diagnosis not present

## 2019-12-09 DIAGNOSIS — E559 Vitamin D deficiency, unspecified: Secondary | ICD-10-CM | POA: Diagnosis not present

## 2020-01-05 DIAGNOSIS — M6281 Muscle weakness (generalized): Secondary | ICD-10-CM | POA: Diagnosis not present

## 2020-01-05 DIAGNOSIS — N183 Chronic kidney disease, stage 3 unspecified: Secondary | ICD-10-CM | POA: Diagnosis not present

## 2020-01-05 DIAGNOSIS — G309 Alzheimer's disease, unspecified: Secondary | ICD-10-CM | POA: Diagnosis not present

## 2020-01-05 DIAGNOSIS — I129 Hypertensive chronic kidney disease with stage 1 through stage 4 chronic kidney disease, or unspecified chronic kidney disease: Secondary | ICD-10-CM | POA: Diagnosis not present

## 2020-01-09 DIAGNOSIS — M545 Low back pain: Secondary | ICD-10-CM | POA: Diagnosis not present

## 2020-01-09 DIAGNOSIS — M47816 Spondylosis without myelopathy or radiculopathy, lumbar region: Secondary | ICD-10-CM | POA: Diagnosis not present

## 2020-01-09 DIAGNOSIS — W19XXXA Unspecified fall, initial encounter: Secondary | ICD-10-CM | POA: Diagnosis not present

## 2020-01-09 DIAGNOSIS — Z79899 Other long term (current) drug therapy: Secondary | ICD-10-CM | POA: Diagnosis not present

## 2020-01-16 DIAGNOSIS — I739 Peripheral vascular disease, unspecified: Secondary | ICD-10-CM | POA: Diagnosis not present

## 2020-01-16 DIAGNOSIS — M1990 Unspecified osteoarthritis, unspecified site: Secondary | ICD-10-CM | POA: Diagnosis not present

## 2020-01-16 DIAGNOSIS — M6281 Muscle weakness (generalized): Secondary | ICD-10-CM | POA: Diagnosis not present

## 2020-01-16 DIAGNOSIS — R2689 Other abnormalities of gait and mobility: Secondary | ICD-10-CM | POA: Diagnosis not present

## 2020-01-29 DIAGNOSIS — N183 Chronic kidney disease, stage 3 unspecified: Secondary | ICD-10-CM | POA: Diagnosis not present

## 2020-01-29 DIAGNOSIS — I129 Hypertensive chronic kidney disease with stage 1 through stage 4 chronic kidney disease, or unspecified chronic kidney disease: Secondary | ICD-10-CM | POA: Diagnosis not present

## 2020-01-29 DIAGNOSIS — R197 Diarrhea, unspecified: Secondary | ICD-10-CM | POA: Diagnosis not present

## 2020-02-09 DIAGNOSIS — R197 Diarrhea, unspecified: Secondary | ICD-10-CM | POA: Diagnosis not present

## 2020-02-09 DIAGNOSIS — G894 Chronic pain syndrome: Secondary | ICD-10-CM | POA: Diagnosis not present

## 2020-02-09 DIAGNOSIS — M47816 Spondylosis without myelopathy or radiculopathy, lumbar region: Secondary | ICD-10-CM | POA: Diagnosis not present

## 2020-02-09 DIAGNOSIS — Z79899 Other long term (current) drug therapy: Secondary | ICD-10-CM | POA: Diagnosis not present

## 2020-03-12 DIAGNOSIS — R262 Difficulty in walking, not elsewhere classified: Secondary | ICD-10-CM | POA: Diagnosis not present

## 2020-03-12 DIAGNOSIS — M545 Low back pain: Secondary | ICD-10-CM | POA: Diagnosis not present

## 2020-03-12 DIAGNOSIS — R296 Repeated falls: Secondary | ICD-10-CM | POA: Diagnosis not present

## 2020-03-12 DIAGNOSIS — R2689 Other abnormalities of gait and mobility: Secondary | ICD-10-CM | POA: Diagnosis not present

## 2020-03-16 DIAGNOSIS — G894 Chronic pain syndrome: Secondary | ICD-10-CM | POA: Diagnosis not present

## 2020-03-16 DIAGNOSIS — Z79899 Other long term (current) drug therapy: Secondary | ICD-10-CM | POA: Diagnosis not present

## 2020-03-16 DIAGNOSIS — M545 Low back pain: Secondary | ICD-10-CM | POA: Diagnosis not present

## 2020-03-16 DIAGNOSIS — G8929 Other chronic pain: Secondary | ICD-10-CM | POA: Diagnosis not present

## 2020-03-19 DIAGNOSIS — R296 Repeated falls: Secondary | ICD-10-CM | POA: Diagnosis not present

## 2020-03-19 DIAGNOSIS — R262 Difficulty in walking, not elsewhere classified: Secondary | ICD-10-CM | POA: Diagnosis not present

## 2020-03-19 DIAGNOSIS — R2689 Other abnormalities of gait and mobility: Secondary | ICD-10-CM | POA: Diagnosis not present

## 2020-03-19 DIAGNOSIS — M545 Low back pain: Secondary | ICD-10-CM | POA: Diagnosis not present

## 2020-04-16 DIAGNOSIS — M545 Low back pain: Secondary | ICD-10-CM | POA: Diagnosis not present

## 2020-04-16 DIAGNOSIS — G8929 Other chronic pain: Secondary | ICD-10-CM | POA: Diagnosis not present

## 2020-04-16 DIAGNOSIS — G894 Chronic pain syndrome: Secondary | ICD-10-CM | POA: Diagnosis not present

## 2020-04-16 DIAGNOSIS — Z79899 Other long term (current) drug therapy: Secondary | ICD-10-CM | POA: Diagnosis not present

## 2020-05-17 DIAGNOSIS — G8929 Other chronic pain: Secondary | ICD-10-CM | POA: Diagnosis not present

## 2020-05-17 DIAGNOSIS — Z79899 Other long term (current) drug therapy: Secondary | ICD-10-CM | POA: Diagnosis not present

## 2020-05-17 DIAGNOSIS — G894 Chronic pain syndrome: Secondary | ICD-10-CM | POA: Diagnosis not present

## 2020-05-17 DIAGNOSIS — M545 Low back pain: Secondary | ICD-10-CM | POA: Diagnosis not present

## 2020-06-17 DIAGNOSIS — G8929 Other chronic pain: Secondary | ICD-10-CM | POA: Diagnosis not present

## 2020-06-17 DIAGNOSIS — G894 Chronic pain syndrome: Secondary | ICD-10-CM | POA: Diagnosis not present

## 2020-06-17 DIAGNOSIS — M545 Low back pain: Secondary | ICD-10-CM | POA: Diagnosis not present

## 2020-06-17 DIAGNOSIS — Z79899 Other long term (current) drug therapy: Secondary | ICD-10-CM | POA: Diagnosis not present

## 2020-06-22 DIAGNOSIS — Z0000 Encounter for general adult medical examination without abnormal findings: Secondary | ICD-10-CM | POA: Diagnosis not present

## 2020-06-22 DIAGNOSIS — E559 Vitamin D deficiency, unspecified: Secondary | ICD-10-CM | POA: Diagnosis not present

## 2020-06-22 DIAGNOSIS — E7800 Pure hypercholesterolemia, unspecified: Secondary | ICD-10-CM | POA: Diagnosis not present

## 2020-06-24 DIAGNOSIS — R05 Cough: Secondary | ICD-10-CM | POA: Diagnosis not present

## 2020-06-24 DIAGNOSIS — J029 Acute pharyngitis, unspecified: Secondary | ICD-10-CM | POA: Diagnosis not present

## 2020-06-24 DIAGNOSIS — J209 Acute bronchitis, unspecified: Secondary | ICD-10-CM | POA: Diagnosis not present

## 2020-06-24 DIAGNOSIS — G309 Alzheimer's disease, unspecified: Secondary | ICD-10-CM | POA: Diagnosis not present

## 2020-06-29 DIAGNOSIS — M47816 Spondylosis without myelopathy or radiculopathy, lumbar region: Secondary | ICD-10-CM | POA: Diagnosis not present

## 2020-06-29 DIAGNOSIS — N1831 Chronic kidney disease, stage 3a: Secondary | ICD-10-CM | POA: Diagnosis not present

## 2020-06-29 DIAGNOSIS — I739 Peripheral vascular disease, unspecified: Secondary | ICD-10-CM | POA: Diagnosis not present

## 2020-06-29 DIAGNOSIS — I129 Hypertensive chronic kidney disease with stage 1 through stage 4 chronic kidney disease, or unspecified chronic kidney disease: Secondary | ICD-10-CM | POA: Diagnosis not present

## 2020-06-29 DIAGNOSIS — M810 Age-related osteoporosis without current pathological fracture: Secondary | ICD-10-CM | POA: Diagnosis not present

## 2020-06-29 DIAGNOSIS — D631 Anemia in chronic kidney disease: Secondary | ICD-10-CM | POA: Diagnosis not present

## 2020-06-29 DIAGNOSIS — Z0000 Encounter for general adult medical examination without abnormal findings: Secondary | ICD-10-CM | POA: Diagnosis not present

## 2020-06-29 DIAGNOSIS — G309 Alzheimer's disease, unspecified: Secondary | ICD-10-CM | POA: Diagnosis not present

## 2020-06-29 DIAGNOSIS — E7800 Pure hypercholesterolemia, unspecified: Secondary | ICD-10-CM | POA: Diagnosis not present

## 2020-07-15 ENCOUNTER — Other Ambulatory Visit (HOSPITAL_COMMUNITY): Payer: Self-pay | Admitting: *Deleted

## 2020-07-16 ENCOUNTER — Ambulatory Visit (HOSPITAL_COMMUNITY)
Admission: RE | Admit: 2020-07-16 | Discharge: 2020-07-16 | Disposition: A | Discharge status: Home / Self Care | Payer: Medicare HMO | Source: Ambulatory Visit | Attending: Internal Medicine | Admitting: Internal Medicine

## 2020-07-16 ENCOUNTER — Other Ambulatory Visit: Payer: Self-pay

## 2020-07-16 DIAGNOSIS — M810 Age-related osteoporosis without current pathological fracture: Secondary | ICD-10-CM | POA: Insufficient documentation

## 2020-07-16 DIAGNOSIS — M545 Low back pain: Secondary | ICD-10-CM | POA: Diagnosis not present

## 2020-07-16 DIAGNOSIS — G894 Chronic pain syndrome: Secondary | ICD-10-CM | POA: Diagnosis not present

## 2020-07-16 DIAGNOSIS — Z79899 Other long term (current) drug therapy: Secondary | ICD-10-CM | POA: Diagnosis not present

## 2020-07-16 DIAGNOSIS — G8929 Other chronic pain: Secondary | ICD-10-CM | POA: Diagnosis not present

## 2020-07-16 MED ORDER — ZOLEDRONIC ACID 5 MG/100ML IV SOLN
INTRAVENOUS | Status: AC
  Filled 2020-07-16: qty 100

## 2020-07-16 MED ORDER — ZOLEDRONIC ACID 5 MG/100ML IV SOLN
5.0000 mg | Freq: Once | INTRAVENOUS | Status: AC
  Administered 2020-07-16: 5 mg via INTRAVENOUS

## 2020-07-30 ENCOUNTER — Emergency Department (HOSPITAL_COMMUNITY): Payer: Medicare HMO

## 2020-07-30 ENCOUNTER — Encounter (HOSPITAL_COMMUNITY): Payer: Self-pay

## 2020-07-30 ENCOUNTER — Observation Stay (HOSPITAL_COMMUNITY)
Admission: EM | Admit: 2020-07-30 | Discharge: 2020-07-31 | Disposition: A | Discharge status: Home / Self Care | Payer: Medicare HMO | Attending: Family Medicine | Admitting: Family Medicine

## 2020-07-30 ENCOUNTER — Other Ambulatory Visit: Payer: Self-pay

## 2020-07-30 ENCOUNTER — Ambulatory Visit
Admission: EM | Admit: 2020-07-30 | Discharge: 2020-07-30 | Disposition: A | Discharge status: Home / Self Care | Payer: Medicare HMO

## 2020-07-30 DIAGNOSIS — F0391 Unspecified dementia with behavioral disturbance: Secondary | ICD-10-CM | POA: Insufficient documentation

## 2020-07-30 DIAGNOSIS — R0602 Shortness of breath: Secondary | ICD-10-CM

## 2020-07-30 DIAGNOSIS — I10 Essential (primary) hypertension: Secondary | ICD-10-CM | POA: Insufficient documentation

## 2020-07-30 DIAGNOSIS — Z87891 Personal history of nicotine dependence: Secondary | ICD-10-CM | POA: Insufficient documentation

## 2020-07-30 DIAGNOSIS — E785 Hyperlipidemia, unspecified: Secondary | ICD-10-CM | POA: Diagnosis not present

## 2020-07-30 DIAGNOSIS — F03918 Unspecified dementia, unspecified severity, with other behavioral disturbance: Secondary | ICD-10-CM | POA: Diagnosis present

## 2020-07-30 DIAGNOSIS — R0902 Hypoxemia: Principal | ICD-10-CM | POA: Insufficient documentation

## 2020-07-30 DIAGNOSIS — R0789 Other chest pain: Secondary | ICD-10-CM | POA: Diagnosis not present

## 2020-07-30 DIAGNOSIS — R079 Chest pain, unspecified: Secondary | ICD-10-CM | POA: Diagnosis not present

## 2020-07-30 DIAGNOSIS — Z79899 Other long term (current) drug therapy: Secondary | ICD-10-CM | POA: Insufficient documentation

## 2020-07-30 DIAGNOSIS — H409 Unspecified glaucoma: Secondary | ICD-10-CM | POA: Diagnosis present

## 2020-07-30 DIAGNOSIS — Z20822 Contact with and (suspected) exposure to covid-19: Secondary | ICD-10-CM | POA: Diagnosis not present

## 2020-07-30 DIAGNOSIS — Z7982 Long term (current) use of aspirin: Secondary | ICD-10-CM | POA: Insufficient documentation

## 2020-07-30 DIAGNOSIS — I700 Atherosclerosis of aorta: Secondary | ICD-10-CM | POA: Diagnosis not present

## 2020-07-30 LAB — COMPREHENSIVE METABOLIC PANEL
ALT: 14 U/L (ref 0–44)
AST: 20 U/L (ref 15–41)
Albumin: 4 g/dL (ref 3.5–5.0)
Alkaline Phosphatase: 61 U/L (ref 38–126)
Anion gap: 6 (ref 5–15)
BUN: 22 mg/dL (ref 8–23)
CO2: 23 mmol/L (ref 22–32)
Calcium: 8.3 mg/dL — ABNORMAL LOW (ref 8.9–10.3)
Chloride: 109 mmol/L (ref 98–111)
Creatinine, Ser: 0.96 mg/dL (ref 0.44–1.00)
GFR calc Af Amer: >60 mL/min (ref >60)
GFR calc non Af Amer: 54 mL/min — ABNORMAL LOW (ref >60)
Glucose, Bld: 121 mg/dL — ABNORMAL HIGH (ref 70–99)
Potassium: 4.1 mmol/L (ref 3.5–5.1)
Sodium: 138 mmol/L (ref 135–145)
Total Bilirubin: 0.3 mg/dL (ref 0.3–1.2)
Total Protein: 6.7 g/dL (ref 6.5–8.1)

## 2020-07-30 LAB — CBC WITH DIFFERENTIAL/PLATELET
Abs Immature Granulocytes: 0.01 10*3/uL (ref 0.00–0.07)
Basophils Absolute: 0 10*3/uL (ref 0.0–0.1)
Basophils Relative: 1 %
Eosinophils Absolute: 0.8 10*3/uL — ABNORMAL HIGH (ref 0.0–0.5)
Eosinophils Relative: 12 %
HCT: 38.1 % (ref 36.0–46.0)
Hemoglobin: 12.1 g/dL (ref 12.0–15.0)
Immature Granulocytes: 0 %
Lymphocytes Relative: 30 %
Lymphs Abs: 2 10*3/uL (ref 0.7–4.0)
MCH: 29.6 pg (ref 26.0–34.0)
MCHC: 31.8 g/dL (ref 30.0–36.0)
MCV: 93.2 fL (ref 80.0–100.0)
Monocytes Absolute: 0.9 10*3/uL (ref 0.1–1.0)
Monocytes Relative: 14 %
Neutro Abs: 2.9 10*3/uL (ref 1.7–7.7)
Neutrophils Relative %: 43 %
Platelets: 163 10*3/uL (ref 150–400)
RBC: 4.09 MIL/uL (ref 3.87–5.11)
RDW: 13.8 % (ref 11.5–15.5)
WBC: 6.7 10*3/uL (ref 4.0–10.5)
nRBC: 0 % (ref 0.0–0.2)

## 2020-07-30 LAB — TROPONIN I (HIGH SENSITIVITY)
Troponin I (High Sensitivity): 7 ng/L (ref <18)
Troponin I (High Sensitivity): 7 ng/L (ref <18)

## 2020-07-30 LAB — SARS CORONAVIRUS 2 BY RT PCR (HOSPITAL ORDER, PERFORMED IN ~~LOC~~ HOSPITAL LAB): SARS Coronavirus 2: NEGATIVE

## 2020-07-30 LAB — BRAIN NATRIURETIC PEPTIDE: B Natriuretic Peptide: 211 pg/mL — ABNORMAL HIGH (ref 0.0–100.0)

## 2020-07-30 MED ORDER — IOHEXOL 350 MG/ML SOLN
100.0000 mL | Freq: Once | INTRAVENOUS | Status: AC | PRN
  Administered 2020-07-30: 100 mL via INTRAVENOUS

## 2020-07-30 NOTE — ED Notes (Signed)
Pt brief changed and purewick placed on pt.

## 2020-07-30 NOTE — ED Triage Notes (Signed)
Patient is being discharged from the Urgent Care and sent to the Emergency Department via ems . Per B [facility] , patient is in need of higher level of care due to hypoxia. Patient is aware and verbalizes understanding of plan of care.  Vitals:   07/30/20 1625  BP: 130/76  Pulse: 61  Resp: (!) 26  Temp: 98.8 F (37.1 C)  SpO2: (!) 80%

## 2020-07-30 NOTE — ED Notes (Signed)
Patient's daughter in law wanting update on patient at this time. Spoke with patient concerning sharing her medical information with said family. Patient stated clearly yes that I could speak.

## 2020-07-30 NOTE — ED Triage Notes (Signed)
Pt presents with sob and is tachypnea upon assesment  Began a couple days ago, pt placed on 02 via nasal cannula . Pt will need transport via ems.

## 2020-07-30 NOTE — ED Triage Notes (Signed)
Pt woke up this morning with chest pressure and SOB. She went to urgent care and was sent here due to hypoxia. O2 sats 90% on RA. 99% on 4L. She has had both COVID vaccines.

## 2020-07-31 ENCOUNTER — Other Ambulatory Visit: Payer: Self-pay

## 2020-07-31 DIAGNOSIS — E785 Hyperlipidemia, unspecified: Secondary | ICD-10-CM

## 2020-07-31 DIAGNOSIS — R0902 Hypoxemia: Secondary | ICD-10-CM | POA: Diagnosis not present

## 2020-07-31 DIAGNOSIS — F0391 Unspecified dementia with behavioral disturbance: Secondary | ICD-10-CM

## 2020-07-31 DIAGNOSIS — I10 Essential (primary) hypertension: Secondary | ICD-10-CM

## 2020-07-31 DIAGNOSIS — H409 Unspecified glaucoma: Secondary | ICD-10-CM | POA: Diagnosis present

## 2020-07-31 LAB — BASIC METABOLIC PANEL
Anion gap: 7 (ref 5–15)
BUN: 15 mg/dL (ref 8–23)
CO2: 23 mmol/L (ref 22–32)
Calcium: 8.5 mg/dL — ABNORMAL LOW (ref 8.9–10.3)
Chloride: 110 mmol/L (ref 98–111)
Creatinine, Ser: 0.8 mg/dL (ref 0.44–1.00)
GFR calc Af Amer: >60 mL/min (ref >60)
GFR calc non Af Amer: >60 mL/min (ref >60)
Glucose, Bld: 98 mg/dL (ref 70–99)
Potassium: 4.2 mmol/L (ref 3.5–5.1)
Sodium: 140 mmol/L (ref 135–145)

## 2020-07-31 LAB — CBC
HCT: 39.4 % (ref 36.0–46.0)
Hemoglobin: 12.5 g/dL (ref 12.0–15.0)
MCH: 29.4 pg (ref 26.0–34.0)
MCHC: 31.7 g/dL (ref 30.0–36.0)
MCV: 92.7 fL (ref 80.0–100.0)
Platelets: 154 10*3/uL (ref 150–400)
RBC: 4.25 MIL/uL (ref 3.87–5.11)
RDW: 13.9 % (ref 11.5–15.5)
WBC: 5.5 10*3/uL (ref 4.0–10.5)
nRBC: 0 % (ref 0.0–0.2)

## 2020-07-31 MED ORDER — ONDANSETRON HCL 4 MG/2ML IJ SOLN: 4.0000 mg | Freq: Four times a day (QID) | INTRAMUSCULAR | Status: DC | PRN

## 2020-07-31 MED ORDER — HYDRALAZINE HCL 25 MG PO TABS: 25.0000 mg | ORAL_TABLET | Freq: Four times a day (QID) | ORAL | Status: DC | PRN

## 2020-07-31 MED ORDER — ACETAMINOPHEN 325 MG PO TABS: 650.0000 mg | ORAL_TABLET | Freq: Four times a day (QID) | ORAL | Status: DC | PRN

## 2020-07-31 MED ORDER — ACETAMINOPHEN 650 MG RE SUPP: 650.0000 mg | Freq: Four times a day (QID) | RECTAL | Status: DC | PRN

## 2020-07-31 MED ORDER — ONDANSETRON HCL 4 MG PO TABS: 4.0000 mg | ORAL_TABLET | Freq: Four times a day (QID) | ORAL | Status: DC | PRN

## 2020-07-31 MED ORDER — CALCIUM CARBONATE ANTACID 500 MG PO CHEW: 1.0000 | CHEWABLE_TABLET | Freq: Every day | ORAL | 3 refills | Status: AC

## 2020-07-31 MED ORDER — ENOXAPARIN SODIUM 40 MG/0.4ML ~~LOC~~ SOLN
40.0000 mg | SUBCUTANEOUS | Status: DC
  Administered 2020-07-31: 40 mg via SUBCUTANEOUS
  Filled 2020-07-31: qty 0.4

## 2020-07-31 NOTE — Progress Notes (Signed)
SATURATION QUALIFICATIONS: (This note is used to comply with regulatory documentation for home oxygen)  Patient Saturations on Room Air at Rest = 94%  Patient Saturations on Room Air while Ambulating = 88%  Patient Saturations on 2 Liters of oxygen while Ambulating = 98%  Please briefly explain why patient needs home oxygen: Patient desated to 88% on room air. Placed back on oxygen 2 liters patient came back up to 98 %.

## 2020-07-31 NOTE — Progress Notes (Signed)
Pt has portable oxygen, understands usage of machine, family at bedside, avs reviewed with both,

## 2020-07-31 NOTE — Discharge Instructions (Signed)
Home Oxygen Use, Adult When a medical condition keeps you from getting enough oxygen, your health care provider may instruct you to take extra oxygen at home. Your health care provider will let you know:  When to take oxygen.  For how long to take oxygen.  How quickly oxygen should be delivered (flow rate), in liters per minute (LPM or L/M). Home oxygen can be given through:  A mask.  A nasal cannula. This is a device or tube that goes in the nostrils.  A transtracheal catheter. This is a small, flexible tube placed in the trachea.  A tracheostomy. This is a surgically made opening in the trachea. These devices are connected with tubing to an oxygen source, such as:  A tank. Tanks hold oxygen in gas form. They must be replaced when the oxygen is used up.  A liquid oxygen device. This holds oxygen in liquid form. It must be replaced when the oxygen is used up.  An oxygen concentrator machine. This filters oxygen in the room. It uses electricity, so you must have a backup cylinder of oxygen in case the power goes out. Supplies needed: To use oxygen, you will need:  A mask, nasal cannula, transtracheal catheter, or tracheostomy.  An oxygen tank, a liquid oxygen device, or an oxygen concentrator.  The tape that your health care provider recommends (optional). If you use a transtracheal catheter and your prescribed flow rate is 1 LPM or greater, you will also need a humidifier. Risks and complications  Fire. This can happen if the oxygen is exposed to a heat source, flame, or spark.  Injury to skin. This can happen if liquid oxygen touches your skin.  Organ damage. This can happen if you get too little oxygen. How to use oxygen Your health care provider or a representative from your medical device company will show you how to use your oxygen device. Follow her or his instructions. The instructions may look something like this: 1. Wash your hands. 2. If you use an oxygen  concentrator, make sure it is plugged in. 3. Place one end of the tube into the port on the tank, device, or machine. 4. Place the mask over your nose and mouth. Or, place the nasal cannula and secure it with tape if instructed. If you use a tracheostomy or transtracheal catheter, connect it to the oxygen source as directed. 5. Make sure the liter-flow setting on the machine is at the level prescribed by your health care provider. 6. Turn on the machine or adjust the knob on the tank or device to the correct liter-flow setting. 7. When you are done, turn off and unplug the machine, or turn the knob to OFF. How to clean and care for the oxygen supplies Nasal cannula  Clean it with a warm, wet cloth daily or as needed.  Wash it with a liquid soap once a week.  Rinse it thoroughly once or twice a week.  Replace it every 2-4 weeks.  If you have an infection, such as a cold or pneumonia, change the cannula when you get better. Mask  Replace it every 2-4 weeks.  If you have an infection, such as a cold or pneumonia, change the mask when you get better. Humidifier bottle  Wash the bottle between each refill: ? Wash it with soap and warm water. ? Rinse it thoroughly. ? Disinfect it and its top. ? Air-dry it.  Make sure it is dry before you refill it. Oxygen concentrator  Clean   the air filter at least twice a week according to directions from your home medical equipment and service company.  Wipe down the cabinet every day. To do this: ? Unplug the unit. ? Wipe down the cabinet with a damp cloth. ? Dry the cabinet. Other equipment  Change any extra tubing every 1-3 months.  Follow instructions from your health care provider about taking care of any other equipment. Safety tips Fire safety tips   Keep your oxygen and oxygen supplies at least 5 ft away from sources of heat, flames, and sparks at all times.  Do not allow smoking near your oxygen. Put up "no smoking" signs in  your home. Avoid smoking areas when in public.  Do not use materials that can burn (are flammable) while you use oxygen.  When you go to a restaurant with portable oxygen, ask to be seated in the nonsmoking section.  Keep a fire extinguisher close by. Let your fire department know that you have oxygen in your home.  Test your home smoke detectors regularly. Traveling  Secure your oxygen tank in the vehicle so that it does not move around. Follow instructions from your medical device company about how to safely secure your tank.  Make sure you have enough oxygen for the amount of time you will be away from home.  If you are planning air travel, contact the airline to find out if they allow the use of an approved portable oxygen concentrator. You may also need documents from your health care provider and medical device company before you travel. General safety tips  If you use an oxygen cylinder, make sure it is in a stand or secured to an object that will not move (fixed object).  If you use liquid oxygen, make sure its container is kept upright.  If you use an oxygen concentrator: ? Tell your electric company. Make sure you are given priority service in the event that your power goes out. ? Avoid using extension cords, if possible. Follow these instructions at home:  Use oxygen only as told by your health care provider.  Do not use alcohol or other drugs that make you relax (sedating drugs) unless instructed. They can slow down your breathing rate and make it hard to get in enough oxygen.  Know how and when to order a refill of oxygen.  Always keep a spare tank of oxygen. Plan ahead for holidays when you may not be able to get a prescription filled.  Use water-based lubricants on your lips or nostrils. Do not use oil-based products like petroleum jelly.  To prevent skin irritation on your cheeks or behind your ears, tuck some gauze under the tubing. Contact a health care  provider if:  You get headaches often.  You have shortness of breath.  You have a lasting cough.  You have anxiety.  You are sleepy all the time.  You develop an illness that affects your breathing.  You cannot exercise at your regular level.  You are restless.  You have difficult or irregular breathing, and it is getting worse.  You have a fever.  You have persistent redness under your nose. Get help right away if:  You are confused.  You have blue lips or fingernails.  You are struggling to breathe. Summary  Your health care provider or a representative from your medical device company will show you how to use your oxygen device. Follow her or his instructions.  If you use an oxygen concentrator, make   sure it is plugged in.  Make sure the liter-flow setting on the machine is at the level prescribed by your health care provider.  Keep your oxygen and oxygen supplies at least 5 ft away from sources of heat, flames, and sparks at all times. This information is not intended to replace advice given to you by your health care provider. Make sure you discuss any questions you have with your health care provider. Document Revised: 05/30/2018 Document Reviewed: 07/04/2016 Elsevier Patient Education  2020 [employer].     Shortness of Breath, Adult Shortness of breath means you have trouble breathing. Shortness of breath could be a sign of a medical problem. Follow these instructions at home:   Watch for any changes in your symptoms.  Do not use any products that contain nicotine or tobacco, such as cigarettes, e-cigarettes, and chewing tobacco.  Do not smoke. Smoking can cause shortness of breath. If you need help to quit smoking, ask your doctor.  Avoid things that can make it harder to breathe, such as: ? Mold. ? Dust. ? Air pollution. ? Chemical smells. ? Things that can cause allergy symptoms (allergens), if you have allergies.  Keep your living space  clean. Use products that help remove mold and dust.  Rest as needed. Slowly return to your normal activities.  Take over-the-counter and prescription medicines only as told by your doctor. This includes oxygen therapy and inhaled medicines.  Keep all follow-up visits as told by your doctor. This is important. Contact a doctor if:  Your condition does not get better as soon as expected.  You have a hard time doing your normal activities, even after you rest.  You have new symptoms. Get help right away if:  Your shortness of breath gets worse.  You have trouble breathing when you are resting.  You feel light-headed or you pass out (faint).  You have a cough that is not helped by medicines.  You cough up blood.  You have pain with breathing.  You have pain in your chest, arms, shoulders, or belly (abdomen).  You have a fever.  You cannot walk up stairs.  You cannot exercise the way you normally do. These symptoms may represent a serious problem that is an emergency. Do not wait to see if the symptoms will go away. Get medical help right away. Call your local emergency services (911 in the [place] Do not drive yourself to the hospital. Summary  Shortness of breath is when you have trouble breathing enough air. It can be a sign of a medical problem.  Avoid things that make it hard for you to breathe, such as smoking, pollution, mold, and dust.  Watch for any changes in your symptoms. Contact your doctor if you do not get better or you get worse. This information is not intended to replace advice given to you by your health care provider. Make sure you discuss any questions you have with your health care provider. Document Revised: 05/13/2018 Document Reviewed: 05/13/2018 Elsevier Patient Education  2020 [employer]    IMPORTANT INFORMATION: PAY CLOSE ATTENTION   PHYSICIAN DISCHARGE INSTRUCTIONS  Follow with Primary care provider  Tisovec, Adelfa Koh, MD  and other  consultants as instructed by your Hospitalist Physician  SEEK MEDICAL CARE OR RETURN TO EMERGENCY ROOM IF SYMPTOMS COME BACK, WORSEN OR NEW PROBLEM DEVELOPS   Please note: You were cared for by a hospitalist during your hospital stay. Every effort will be made to forward records to your  primary care provider.  You can request that your primary care provider send for your hospital records if they have not received them.  Once you are discharged, your primary care physician will handle any further medical issues. Please note that NO REFILLS for any discharge medications will be authorized once you are discharged, as it is imperative that you return to your primary care physician (or establish a relationship with a primary care physician if you do not have one) for your post hospital discharge needs so that they can reassess your need for medications and monitor your lab values.  Please get a complete blood count and chemistry panel checked by your Primary MD at your next visit, and again as instructed by your Primary MD.  Get Medicines reviewed and adjusted: Please take all your medications with you for your next visit with your Primary MD  Laboratory/radiological data: Please request your Primary MD to go over all hospital tests and procedure/radiological results at the follow up, please ask your primary care provider to get all Hospital records sent to his/her office.  In some cases, they will be blood work, cultures and biopsy results pending at the time of your discharge. Please request that your primary care provider follow up on these results.  If you are diabetic, please bring your blood sugar readings with you to your follow up appointment with primary care.    Please call and make your follow up appointments as soon as possible.    Also Note the following: If you experience worsening of your admission symptoms, develop shortness of breath, life threatening emergency, suicidal or  homicidal thoughts you must seek medical attention immediately by calling 911 or calling your MD immediately  if symptoms less severe.  You must read complete instructions/literature along with all the possible adverse reactions/side effects for all the Medicines you take and that have been prescribed to you. Take any new Medicines after you have completely understood and accpet all the possible adverse reactions/side effects.   Do not drive when taking Pain medications or sleeping medications (Benzodiazepines)  Do not take more than prescribed Pain, Sleep and Anxiety Medications. It is not advisable to combine anxiety,sleep and pain medications without talking with your primary care practitioner  Special Instructions: If you have smoked or chewed Tobacco  in the last 2 yrs please stop smoking, stop any regular Alcohol  and or any Recreational drug use.  Wear Seat belts while driving.  Do not drive if taking any narcotic, mind altering or controlled substances or recreational drugs or alcohol.    1. Follow up with PCP in 2 weeks 2. Please obtain BMP in 2 weeks to check calcium level

## 2020-07-31 NOTE — Discharge Summary (Signed)
Physician Discharge Summary  Cynthia Guzman NOM:[id number] DOB: 1932-12-01 DOA: 07/30/2020  PCP: Gaspar Garbe, MD  Admit date: 07/30/2020 Discharge date: 07/31/2020  Admitted From:  HOME  Disposition: HOME   Recommendations for Outpatient Follow-up:  1. Follow up with PCP in 2 weeks 2. Please obtain BMP in 2 weeks to check calcium level  Home Health: Home oxygen 2L/min  Discharge Condition: STABLE   CODE STATUS: FULL    Brief Hospitalization Summary: Please see all hospital notes, images, labs for full details of the hospitalization. ADMISSION HPI: Cynthia Guzman is a 86 y.o. female with medical history significant of chronic back pain, anxiety, depression, eczema, glaucoma, hypertension, dementia, osteoporosis who is coming to the emergency department from the origin cancer team due to hypoxia.  She denies dyspnea at this time, but states she gets tired quickly when inserting.  No fever, chills, sore throat, cough, wheezing, chest pain, palpitations, recent lower extremity edema, abdominal pain, nausea, vomiting, diarrhea, melena or hematochezia.  No dysuria, frequency or hematuria.  ED Course: Initial vital signs were temperature 98.6 F, pulse 57, respiration 19, blood pressure 145 to 61 mmHg O2 sat 97% on room air.  CBC was normal, except for eosinophils of 12%.  CMP showed a glucose of 121 and calcium of 8.3 mg/dL.  Chest radiograph and CT did not show any acute abnormality, but has chronic findings of emphysema and atelectasis.  Pt was placed in observation.  She has longtime COPD emphysema and has a new oxygen requirement.  She does very well on 2L/min [place].  Her hypoxia work up did not show any acute findings.  She had a CXR, CTA with no acute findings.  She does have emphysema.  PT evaluated her and she did very well.   They did not recommend any further PT follow up.  She lives with daughter in law who cares for her.  She is stable to discharge home.  Her calcium was a  little low and 1 tums chew daily was recommended.  No changes to home meds were made. Arrangements for home oxygen made prior to discharge.    Discharge Diagnoses:  Principal Problem:   Hypoxia Active Problems:   Essential hypertension   HLD (hyperlipidemia)   Dementia with behavioral disturbance (HCC)   Glaucoma   Discharge Instructions:  Allergies as of 07/31/2020      Reactions   Arthritis Pain Regimen [aspirin] Nausea And Vomiting   Pt states all arthritis medications      Medication List    STOP taking these medications   cephALEXin 500 MG capsule Commonly known as: KEFLEX   methocarbamol 500 MG tablet Commonly known as: ROBAXIN   ondansetron 4 MG tablet Commonly known as: ZOFRAN   oxyCODONE 5 MG immediate release tablet Commonly known as: Oxy IR/ROXICODONE   zoledronic acid 5 MG/100ML Soln injection Commonly known as: RECLAST     TAKE these medications   ALIGN PO Take by mouth.   amLODipine 5 MG tablet Commonly known as: NORVASC Take 1 tablet (5 mg total) by mouth at bedtime.   aspirin EC 81 MG tablet Take 81 mg by mouth daily.   buprenorphine 20 MCG/HR Ptwk Commonly known as: BUTRANS Place 1 patch onto the skin once a week.   calcium carbonate 500 MG chewable tablet Commonly known as: Tums Chew 1 tablet (200 mg of elemental calcium total) by mouth daily.   dorzolamide-timolol 22.3-6.8 MG/ML ophthalmic solution Commonly known as: COSOPT Place 1 drop into  both eyes 2 (two) times daily.   latanoprost 0.005 % ophthalmic solution Commonly known as: XALATAN Place 1 drop into both eyes at bedtime.   metoprolol tartrate 50 MG tablet Commonly known as: LOPRESSOR Take 50 mg by mouth daily.   promethazine 25 MG tablet Commonly known as: PHENERGAN Take 25 mg by mouth 4 (four) times daily.   sertraline 100 MG tablet Commonly known as: ZOLOFT Take 150 mg by mouth at bedtime.   simvastatin 20 MG tablet Commonly known as: ZOCOR Take 20 mg by mouth  at bedtime.   telmisartan 40 MG tablet Commonly known as: MICARDIS Take 40 mg by mouth daily.   Vitamin D 50 MCG (2000 UT) Caps Take 2,000 Units by mouth daily.            Durable Medical Equipment  (From admission, onward)         Start     Ordered   07/31/20 1218  For home use only DME oxygen  Once       Question Answer Comment  Length of Need Lifetime   Mode or (Route) Nasal cannula   Liters per Minute 2   Frequency Continuous (stationary and portable oxygen unit needed)   Oxygen conserving device Yes   Oxygen delivery system Gas      07/31/20 1217          Follow-up Information    Tisovec, Adelfa Koh, MD. Schedule an appointment as soon as possible for a visit in 2 week(s).   Specialty: Internal Medicine Contact information: [address] [place] [address] [phone]              Allergies  Allergen Reactions  . Arthritis Pain Regimen [Aspirin] Nausea And Vomiting    Pt states all arthritis medications   Allergies as of 07/31/2020      Reactions   Arthritis Pain Regimen [aspirin] Nausea And Vomiting   Pt states all arthritis medications      Medication List    STOP taking these medications   cephALEXin 500 MG capsule Commonly known as: KEFLEX   methocarbamol 500 MG tablet Commonly known as: ROBAXIN   ondansetron 4 MG tablet Commonly known as: ZOFRAN   oxyCODONE 5 MG immediate release tablet Commonly known as: Oxy IR/ROXICODONE   zoledronic acid 5 MG/100ML Soln injection Commonly known as: RECLAST     TAKE these medications   ALIGN PO Take by mouth.   amLODipine 5 MG tablet Commonly known as: NORVASC Take 1 tablet (5 mg total) by mouth at bedtime.   aspirin EC 81 MG tablet Take 81 mg by mouth daily.   buprenorphine 20 MCG/HR Ptwk Commonly known as: BUTRANS Place 1 patch onto the skin once a week.   calcium carbonate 500 MG chewable tablet Commonly known as: Tums Chew 1 tablet (200 mg of elemental calcium total) by  mouth daily.   dorzolamide-timolol 22.3-6.8 MG/ML ophthalmic solution Commonly known as: COSOPT Place 1 drop into both eyes 2 (two) times daily.   latanoprost 0.005 % ophthalmic solution Commonly known as: XALATAN Place 1 drop into both eyes at bedtime.   metoprolol tartrate 50 MG tablet Commonly known as: LOPRESSOR Take 50 mg by mouth daily.   promethazine 25 MG tablet Commonly known as: PHENERGAN Take 25 mg by mouth 4 (four) times daily.   sertraline 100 MG tablet Commonly known as: ZOLOFT Take 150 mg by mouth at bedtime.   simvastatin 20 MG tablet Commonly known as: ZOCOR Take 20  mg by mouth at bedtime.   telmisartan 40 MG tablet Commonly known as: MICARDIS Take 40 mg by mouth daily.   Vitamin D 50 MCG (2000 UT) Caps Take 2,000 Units by mouth daily.            Durable Medical Equipment  (From admission, onward)         Start     Ordered   07/31/20 1218  For home use only DME oxygen  Once       Question Answer Comment  Length of Need Lifetime   Mode or (Route) Nasal cannula   Liters per Minute 2   Frequency Continuous (stationary and portable oxygen unit needed)   Oxygen conserving device Yes   Oxygen delivery system Gas      07/31/20 1217          Procedures/Studies: CT Angio Chest PE W/Cm &/Or Wo Cm  Result Date: 07/30/2020 CLINICAL DATA:  Chest pressure and shortness of breath EXAM: CT ANGIOGRAPHY CHEST WITH CONTRAST TECHNIQUE: Multidetector CT imaging of the chest was performed using the standard protocol during bolus administration of intravenous contrast. Multiplanar CT image reconstructions and MIPs were obtained to evaluate the vascular anatomy. CONTRAST:  OMNIPAQUE IOHEXOL 350 MG/ML SOLN COMPARISON:  None. FINDINGS: Cardiovascular: There is a optimal opacification of the pulmonary arteries. There is no central,segmental, or subsegmental filling defects within the pulmonary arteries. There is moderate cardiomegaly present. Coronary artery  calcifications are present. There is scattered aortic atherosclerosis is present. There is normal three-vessel brachiocephalic anatomy without proximal stenosis. Mediastinum/Nodes: No hilar, mediastinal, or axillary adenopathy. Thyroid gland, trachea, and esophagus demonstrate no significant findings. Lungs/Pleura: Minimal bibasilar dependent atelectasis seen. No pleural effusion or pneumothorax. No airspace consolidation. Upper Abdomen: No acute abnormalities present in the visualized portions of the upper abdomen. Musculoskeletal: No chest wall abnormality. No acute or significant osseous findings. Review of the MIP images confirms the above findings. IMPRESSION: No central, segmental, or subsegmental pulmonary embolism. Aortic Atherosclerosis (ICD10-I70.0). Minimal bibasilar dependent atelectasis. Electronically Signed   By: Jonna Clark M.D.   On: 07/30/2020 22:48   DG Chest Port 1 View  Result Date: 07/30/2020 CLINICAL DATA:  Shortness of breath and lethargy. EXAM: PORTABLE CHEST 1 VIEW COMPARISON:  August 12, 2019 FINDINGS: Mild, diffuse chronic appearing increased interstitial lung markings are noted. Very mild bibasilar atelectasis and/or infiltrate is noted. There is no evidence of a pleural effusion or pneumothorax. The heart size and mediastinal contours are within normal limits. There is moderate severity calcification of the aortic arch. Degenerative changes seen within the thoracic spine. IMPRESSION: Mild, diffuse chronic appearing increased interstitial lung markings with very mild bibasilar atelectasis and/or infiltrate. Electronically Signed   By: Aram Candela M.D.   On: 07/30/2020 18:29      Subjective: Pt without complaints.  No SOB, no CP. No cough, eating and drinking well.  Did very well with PT.  No PT needs identified.   Discharge Exam: Vitals:   07/31/20 0800 07/31/20 1009  BP: (!) 150/51 (!) 152/94  Pulse: (!) 56 (!) 58  Resp: 12 16  Temp:  98.3 F (36.8 C)  SpO2: 92%  99%   Vitals:   07/31/20 0232 07/31/20 0643 07/31/20 0800 07/31/20 1009  BP: (!) 173/67 (!) 185/55 (!) 150/51 (!) 152/94  Pulse: 61 (!) 56 (!) 56 (!) 58  Resp: 16 18 12 16   Temp:    98.3 F (36.8 C)  TempSrc:    Oral  SpO2: 95%  96% 92% 99%   General: Pt is alert, awake, not in acute distress Cardiovascular: normal S1/S2 +, no rubs, no gallops Respiratory: CTA bilaterally, no wheezing, no rhonchi Abdominal: Soft, NT, ND, bowel sounds + Extremities: no edema, no cyanosis   The results of significant diagnostics from this hospitalization (including imaging, microbiology, ancillary and laboratory) are listed below for reference.     Microbiology: Recent Results (from the past 240 hour(s))  SARS Coronavirus 2 by RT PCR (hospital order, performed in [facility] hospital lab) Nasopharyngeal Nasopharyngeal Swab     Status: None   Collection Time: 07/30/20  7:29 PM   Specimen: Nasopharyngeal Swab  Result Value Ref Range Status   SARS Coronavirus 2 NEGATIVE NEGATIVE Final    Comment: (NOTE) SARS-CoV-2 target nucleic acids are NOT DETECTED.  The SARS-CoV-2 RNA is generally detectable in upper and lower respiratory specimens during the acute phase of infection. The lowest concentration of SARS-CoV-2 viral copies this assay can detect is 250 copies / mL. A negative result does not preclude SARS-CoV-2 infection and should not be used as the sole basis for treatment or other patient management decisions.  A negative result may occur with improper specimen collection / handling, submission of specimen other than nasopharyngeal swab, presence of viral mutation(s) within the areas targeted by this assay, and inadequate number of viral copies (<250 copies / mL). A negative result must be combined with clinical observations, patient history, and epidemiological information.  Fact Sheet for Patients:   BoilerBrush.com.cy  Fact Sheet for Healthcare  Providers: https://pope.com/  This test is not yet approved or  cleared by the [place] FDA and has been authorized for detection and/or diagnosis of SARS-CoV-2 by FDA under an Emergency Use Authorization (EUA).  This EUA will remain in effect (meaning this test can be used) for the duration of the COVID-19 declaration under Section 564(b)(1) of the Act, 21 U.S.C. section 360bbb-3(b)(1), unless the authorization is terminated or revoked sooner.  Performed at [facility], [address], [place], [place] [address]      Labs: BNP (last 3 results) Recent Labs    07/30/20 1904  BNP 211.0*   Basic Metabolic Panel: Recent Labs  Lab 07/30/20 1904 07/31/20 0902  NA 138 140  K 4.1 4.2  CL 109 110  CO2 23 23  GLUCOSE 121* 98  BUN 22 15  CREATININE 0.96 0.80  CALCIUM 8.3* 8.5*   Liver Function Tests: Recent Labs  Lab 07/30/20 1904  AST 20  ALT 14  ALKPHOS 61  BILITOT 0.3  PROT 6.7  ALBUMIN 4.0   No results for input(s): LIPASE, AMYLASE in the last 168 hours. No results for input(s): AMMONIA in the last 168 hours. CBC: Recent Labs  Lab 07/30/20 1904 07/31/20 0902  WBC 6.7 5.5  NEUTROABS 2.9  --   HGB 12.1 12.5  HCT 38.1 39.4  MCV 93.2 92.7  PLT 163 154   Cardiac Enzymes: No results for input(s): CKTOTAL, CKMB, CKMBINDEX, TROPONINI in the last 168 hours. BNP: Invalid input(s): POCBNP CBG: No results for input(s): GLUCAP in the last 168 hours. D-Dimer No results for input(s): DDIMER in the last 72 hours. Hgb A1c No results for input(s): HGBA1C in the last 72 hours. Lipid Profile No results for input(s): CHOL, HDL, LDLCALC, TRIG, CHOLHDL, LDLDIRECT in the last 72 hours. Thyroid function studies No results for input(s): TSH, T4TOTAL, T3FREE, THYROIDAB in the last 72 hours.  Invalid input(s): FREET3 Anemia work up No results for input(s):  VITAMINB12, FOLATE, FERRITIN, TIBC, IRON, RETICCTPCT in the last 72 hours. Urinalysis     Component Value Date/Time   COLORURINE YELLOW 10/01/2019 1612   APPEARANCEUR CLOUDY (A) 10/01/2019 1612   LABSPEC 1.015 10/01/2019 1612   PHURINE 8.0 10/01/2019 1612   GLUCOSEU NEGATIVE 10/01/2019 1612   HGBUR NEGATIVE 10/01/2019 1612   BILIRUBINUR NEGATIVE 10/01/2019 1612   KETONESUR NEGATIVE 10/01/2019 1612   PROTEINUR NEGATIVE 10/01/2019 1612   UROBILINOGEN 1.0 11/12/2012 1336   NITRITE NEGATIVE 10/01/2019 1612   LEUKOCYTESUR SMALL (A) 10/01/2019 1612   Sepsis Labs Invalid input(s): PROCALCITONIN,  WBC,  LACTICIDVEN Microbiology Recent Results (from the past 240 hour(s))  SARS Coronavirus 2 by RT PCR (hospital order, [facility]) Nasopharyngeal Nasopharyngeal Swab     Status: None   Collection Time: 07/30/20  7:29 PM   Specimen: Nasopharyngeal Swab  Result Value Ref Range Status   SARS Coronavirus 2 NEGATIVE NEGATIVE Final    Comment: (NOTE) SARS-CoV-2 target nucleic acids are NOT DETECTED.  The SARS-CoV-2 RNA is generally detectable in upper and lower respiratory specimens during the acute phase of infection. The lowest concentration of SARS-CoV-2 viral copies this assay can detect is 250 copies / mL. A negative result does not preclude SARS-CoV-2 infection and should not be used as the sole basis for treatment or other patient management decisions.  A negative result may occur with improper specimen collection / handling, submission of specimen other than nasopharyngeal swab, presence of viral mutation(s) within the areas targeted by this assay, and inadequate number of viral copies (<250 copies / mL). A negative result must be combined with clinical observations, patient history, and epidemiological information.  Fact Sheet for Patients:   BoilerBrush.com.cyhttps://www.fda.gov/media/136312/download  Fact Sheet for Healthcare Providers: https://pope.com/https://www.fda.gov/media/136313/download  This test is not yet approved or  cleared by the [place] FDA and has been  authorized for detection and/or diagnosis of SARS-CoV-2 by FDA under an Emergency Use Authorization (EUA).  This EUA will remain in effect (meaning this test can be used) for the duration of the COVID-19 declaration under Section 564(b)(1) of the Act, 21 U.S.C. section 360bbb-3(b)(1), unless the authorization is terminated or revoked sooner.  Performed at [facility], [address] [place], [place] [id number]    Time coordinating discharge:   SIGNED:  Standley Dakinslanford Neftali Thurow, MD  [facility] 07/31/2020, 12:30 PM How to contact the [facility] Attending or Consulting provider 7A - 7P or covering provider during after hours 7P -7A, for this patient?  1. Check the care team in [facility] and look for a) attending/consulting [facility] provider listed and b) the [facility] team listed 2. Log into [email or web address] and use [facility] universal password to access. If you do not have the password, please contact the hospital operator. 3. Locate the [facility] provider you are looking for under [facility] and page to a number that you can be directly reached. 4. If you still have difficulty reaching the provider, please page the [facility] (Director on Call) for the Hospitalists listed on amion for assistance.

## 2020-07-31 NOTE — H&P (Signed)
History and Physical    Cynthia CrosbyRachael M Shake VWU:[id number] DOB: 08/26/1932 DOA: 07/30/2020  PCP: Gaspar Garbeisovec, Richard W, MD   Patient coming from: Home.  I have personally briefly reviewed patient's old medical [facility]  Chief Complaint: Hypoxia.  HPI: Cynthia Guzman is a 86 y.o. female with medical history significant of chronic back pain, anxiety, depression, eczema, glaucoma, hypertension, dementia, osteoporosis who is coming to the emergency department from the origin cancer team due to hypoxia.  She denies dyspnea at this time, but states she gets tired quickly when inserting.  No fever, chills, sore throat, cough, wheezing, chest pain, palpitations, recent lower extremity edema, abdominal pain, nausea, vomiting, diarrhea, melena or hematochezia.  No dysuria, frequency or hematuria.  ED Course: Initial vital signs were temperature 98.6 F, pulse 57, respiration 19, blood pressure 145 to 61 mmHg O2 sat 97% on room air.  CBC was normal, except for eosinophils of 12%.  CMP showed a glucose of 121 and calcium of 8.3 mg/dL.  Chest radiograph and CT did not show any acute abnormality, but has chronic findings of emphysema and atelectasis.  Review of Systems: As per HPI otherwise all other systems reviewed and are negative.  Past Medical History:  Diagnosis Date  . Chronic back pain   . Complication of anesthesia    hard to wake up  . Depression   . eczema   . Glaucoma   . Hearing impairment   . Hypertension   . Memory impairment   . Osteoporosis    Past Surgical History:  Procedure Laterality Date  . ABDOMINAL HYSTERECTOMY    . BACK SURGERY     x 3  . DILATION AND CURETTAGE OF UTERUS    . EYE SURGERY     bilateral cataracts with lens implants  . HAND SURGERY     left hand surgery  . TOTAL KNEE ARTHROPLASTY  11/18/2012   Procedure: TOTAL KNEE ARTHROPLASTY;  Surgeon: Loanne DrillingFrank V Aluisio, MD;  Location: [facility];  Service: Orthopedics;  Laterality: Right;    Social History  reports that she quit smoking about 4 years ago. Her smoking use included cigarettes. She has a 60.00 pack-year smoking history. She has never used smokeless tobacco. She reports that she does not drink alcohol and does not use drugs.  Allergies  Allergen Reactions  . Arthritis Pain Regimen [Aspirin] Nausea And Vomiting    Pt states all arthritis medications    Family History  Problem Relation Age of Onset  . Alzheimer's disease Other        "all of her aunts and her mother" per daughter   Prior to Admission medications   Medication Sig Start Date End Date Taking? Authorizing Provider  amLODipine (NORVASC) 5 MG tablet Take 1 tablet (5 mg total) by mouth at bedtime. Patient not taking: Reported on 09/18/2019 10/27/18   Maretta BeesGhimire, Shanker M, MD  aspirin EC 81 MG tablet Take 81 mg by mouth daily.    [provider]  buprenorphine (BUTRANS) 10 MCG/HR PTWK patch APPLY 1 PATCH ONCE PER WEEK 08/27/19   [provider]  cephALEXin (KEFLEX) 500 MG capsule Take 1 capsule (500 mg total) by mouth 3 (three) times daily. 10/01/19   Lorre NickAllen, Anthony, MD  Cholecalciferol (VITAMIN D) 2000 units CAPS Take 2,000 Units by mouth daily.    [provider]  dorzolamide-timolol (COSOPT) 22.3-6.8 MG/ML ophthalmic solution Place 1 drop into both eyes 2 (two) times daily.    [provider]  latanoprost (XALATAN) 0.005 % ophthalmic solution Place 1 drop into both eyes at bedtime.    [provider]  methocarbamol (ROBAXIN) 500 MG tablet Take 1 tablet (500 mg total) by mouth every 6 (six) hours as needed. Patient not taking: Reported on 10/25/2018 11/20/12   Julien Girt, Alexzandrew L, PA-C  metoprolol (LOPRESSOR) 50 MG tablet Take 50 mg by mouth daily.     [provider]  morphine (MSIR) 30 MG tablet Take 30 mg by mouth every 6 (six) hours as needed for moderate pain. Pain    [provider]  ondansetron (ZOFRAN) 4 MG tablet Take 1 tablet (4 mg  total) by mouth every 8 (eight) hours as needed for nausea or vomiting. Patient not taking: Reported on 09/18/2019 11/05/18   Carlyle Basques P, PA-C  oxyCODONE (OXY IR/ROXICODONE) 5 MG immediate release tablet Take 1-2 tablets (5-10 mg total) by mouth every 3 (three) hours as needed. Patient not taking: Reported on 10/25/2018 11/20/12   Julien Girt, Alexzandrew L, PA-C  Probiotic Product (ALIGN PO) Take by mouth.    [provider]  promethazine (PHENERGAN) 25 MG tablet Take 25 mg by mouth 4 (four) times daily.    [provider]  sertraline (ZOLOFT) 100 MG tablet Take 150 mg by mouth at bedtime.     [provider]  simvastatin (ZOCOR) 20 MG tablet Take 20 mg by mouth at bedtime.     [provider]  telmisartan (MICARDIS) 40 MG tablet Take 40 mg by mouth daily. 08/03/19   [provider]  zoledronic acid (RECLAST) 5 MG/100ML SOLN injection Inject 5 mg into the vein once.    [provider]   Physical Exam: Vitals:   07/30/20 2300 07/31/20 0000 07/31/20 0125 07/31/20 0232  BP: (!) 149/103 139/63 (!) 122/45 (!) 173/67  Pulse: 69  (!) 59 61  Resp: 18 12 16 16   Temp:      TempSrc:      SpO2: 94%  93% 95%   Constitutional: NAD, calm, comfortable Eyes: PERRL, lids and conjunctivae normal ENMT: Mucous membranes are moist. Posterior pharynx clear of any exudate or lesions. Neck: normal, supple, no masses, no thyromegaly Respiratory: Decreased breath sounds in bases, otherwise clear to auscultation bilaterally, no wheezing, no crackles. Normal respiratory effort. No accessory muscle use.  Cardiovascular: Regular rate and rhythm, no murmurs / rubs / gallops. No extremity edema. 2+ pedal pulses. No carotid bruits.  Abdomen: Nondistended.  Soft, no tenderness, no masses palpated. No hepatosplenomegaly. Bowel sounds positive.  Musculoskeletal: no clubbing / cyanosis. Good ROM, no contractures. Normal muscle tone.  Skin: no major rashes, lesions, ulcers  on limited dermatological exam. Neurologic: CN 2-12 grossly intact. Sensation intact, DTR normal. Strength 5/5 in all 4.  Psychiatric: Normal judgment and insight. Alert and oriented x 2, partially oriented to date. Normal mood.   Labs on Admission: I have personally reviewed following labs and imaging studies  CBC: Recent Labs  Lab 07/30/20 1904  WBC 6.7  NEUTROABS 2.9  HGB 12.1  HCT 38.1  MCV 93.2  PLT 163    Basic Metabolic Panel: Recent Labs  Lab 07/30/20 1904  NA 138  K 4.1  CL 109  CO2 23  GLUCOSE 121*  BUN 22  CREATININE 0.96  CALCIUM 8.3*   GFR: CrCl cannot be calculated (Unknown ideal weight.).  Liver Function Tests: Recent Labs  Lab 07/30/20 1904  AST 20  ALT 14  ALKPHOS 61  BILITOT 0.3  PROT  6.7  ALBUMIN 4.0   Radiological Exams on Admission: CT Angio Chest PE W/Cm &/Or Wo Cm  Result Date: 07/30/2020 CLINICAL DATA:  Chest pressure and shortness of breath EXAM: CT ANGIOGRAPHY CHEST WITH CONTRAST TECHNIQUE: Multidetector CT imaging of the chest was performed using the standard protocol during bolus administration of intravenous contrast. Multiplanar CT image reconstructions and MIPs were obtained to evaluate the vascular anatomy. CONTRAST:  OMNIPAQUE IOHEXOL 350 MG/ML SOLN COMPARISON:  None. FINDINGS: Cardiovascular: There is a optimal opacification of the pulmonary arteries. There is no central,segmental, or subsegmental filling defects within the pulmonary arteries. There is moderate cardiomegaly present. Coronary artery calcifications are present. There is scattered aortic atherosclerosis is present. There is normal three-vessel brachiocephalic anatomy without proximal stenosis. Mediastinum/Nodes: No hilar, mediastinal, or axillary adenopathy. Thyroid gland, trachea, and esophagus demonstrate no significant findings. Lungs/Pleura: Minimal bibasilar dependent atelectasis seen. No pleural effusion or pneumothorax. No airspace consolidation. Upper  Abdomen: No acute abnormalities present in the visualized portions of the upper abdomen. Musculoskeletal: No chest wall abnormality. No acute or significant osseous findings. Review of the MIP images confirms the above findings. IMPRESSION: No central, segmental, or subsegmental pulmonary embolism. Aortic Atherosclerosis (ICD10-I70.0). Minimal bibasilar dependent atelectasis. Electronically Signed   By: Jonna Clark M.D.   On: 07/30/2020 22:48   DG Chest Port 1 View  Result Date: 07/30/2020 CLINICAL DATA:  Shortness of breath and lethargy. EXAM: PORTABLE CHEST 1 VIEW COMPARISON:  August 12, 2019 FINDINGS: Mild, diffuse chronic appearing increased interstitial lung markings are noted. Very mild bibasilar atelectasis and/or infiltrate is noted. There is no evidence of a pleural effusion or pneumothorax. The heart size and mediastinal contours are within normal limits. There is moderate severity calcification of the aortic arch. Degenerative changes seen within the thoracic spine. IMPRESSION: Mild, diffuse chronic appearing increased interstitial lung markings with very mild bibasilar atelectasis and/or infiltrate. Electronically Signed   By: Aram Candela M.D.   On: 07/30/2020 18:29    EKG: Independently reviewed.  Vent. rate 58 BPM PR interval * ms QRS duration 82 ms QT/QTc 454/446 ms P-R-T axes 53 69 49 Sinus rhythm Low voltage, precordial leads  Assessment/Plan Principal Problem:   Hypoxia Due to chronic atelectasis/emphysema. Allergic component due to eosinophilia? Observation/telemetry. Continue supplemental oxygen. Will need home O2 given previous episodes of hypoxia. Consult TOC for home O2 arrangement. Needs daily incentive spirometry at home.  Active Problems:   Essential hypertension Continue antihypertensives after med reconciliation. Monitor blood pressure.    HLD (hyperlipidemia) Previously on simvastatin. Will need med reconciliation.    Dementia with behavioral  disturbance (HCC) Supportive care.    Glaucoma Continue Xalatan and Cosopt drops.    Hypocalcemia Repeat calcium level. Further work-up is still low.   DVT prophylaxis: Lovenox SQ. Code Status:   Full code. Family Communication:   Disposition Plan:   Patient is from:  Home.  Anticipated DC to:  Home.  Anticipated DC date:  07/31/2020  Anticipated DC barriers: Clinical status. Consults called:   Admission status:  Observation/telemetry.  Severity of Illness:  Bobette Mo MD [facility]  How to contact the [facility] Attending or Consulting provider 7A - 7P or covering provider during after hours 7P -7A, for this patient?   1. Check the care team in [facility] and look for a) attending/consulting [facility] provider listed and b) the [facility] team listed 2. Log into [email or web address] and use [place]'s universal password to access. If you do not have the password, please contact  the hospital operator. 3. Locate the [facility] provider you are looking for under [facility] and page to a number that you can be directly reached. 4. If you still have difficulty reaching the provider, please page the [facility] (Director on Call) for the Hospitalists listed on amion for assistance.  07/31/2020, 5:51 AM   This document was prepared using Dragon voice recognition software and may contain some unintended transcription errors.

## 2020-07-31 NOTE — Evaluation (Signed)
Physical Therapy Evaluation Patient Details Name: Cynthia Guzman MRN: [id number] DOB: 06-15-1932 Today's Date: 07/31/2020   History of Present Illness  Seven Cynthia Guzman is a 86 y.o. female with medical history significant of chronic back pain, anxiety, depression, eczema, glaucoma, hypertension, dementia, osteoporosis who is coming to the emergency department from the origin cancer team due to hypoxia.  She denies dyspnea at this time, but states she gets tired quickly  Clinical Impression  Pt and family states pt is a ambulates in home only.  PT is able to physically demonstrate the ability to do this.      Follow Up Recommendations No PT follow up    Equipment Recommendations  None recommended by PT    Recommendations for Other Services   none    Precautions / Restrictions Precautions Precautions: None Restrictions Weight Bearing Restrictions: No      Mobility  Bed Mobility Overal bed mobility: Modified Independent                Transfers Overall transfer level: Modified independent                  Ambulation/Gait Ambulation/Gait assistance: Modified independent (Device/Increase time) Gait Distance (Feet): 80 Feet Assistive device: Rolling walker (2 wheeled)       General Gait Details: 4 L O2                      Pertinent Vitals/Pain Pain Assessment: No/denies pain    Home Living Family/patient expects to be discharged to:: Private residence Living Arrangements: Children Available Help at Discharge: Available 24 hours/day;Friend(s) Type of Home: House Home Access: Stairs to enter Entrance Stairs-Rails: None (family assists with one on each side) Entrance Stairs-Number of Steps: 3 Home Layout: One level Home Equipment: Walker - 2 wheels;Cane - quad;Bedside commode;Walker - 4 wheels      Prior Function Level of Independence: Independent with assistive device(s)                  Extremity/Trunk Assessment   Upper  Extremity Assessment Upper Extremity Assessment: Generalized weakness    Lower Extremity Assessment Lower Extremity Assessment: Generalized weakness       Communication   Communication: HOH  Cognition Arousal/Alertness: Awake/alert   Overall Cognitive Status: Within Functional Limits for tasks assessed                                               Assessment/Plan    PT Assessment Patient needs continued PT services  PT Problem List Decreased strength;Decreased activity tolerance       PT Treatment Interventions Therapeutic activities;Therapeutic exercise;Gait training    PT Goals (Current goals can be found in the Care Plan section)  Acute Rehab PT Goals Patient Stated Goal: To go home PT Goal Formulation: With patient Time For Goal Achievement: 08/01/20 Potential to Achieve Goals: Good    Frequency Min 2X/week   Barriers to discharge   none       AM-PAC PT "6 Clicks" Mobility  Outcome Measure Help needed turning from your back to your side while in a flat bed without using bedrails?: None Help needed moving from lying on your back to sitting on the side of a flat bed without using bedrails?: A Little Help needed moving to and from a bed to a chair (including a  wheelchair)?: None Help needed standing up from a chair using your arms (e.g., wheelchair or bedside chair)?: None Help needed to walk in hospital room?: None Help needed climbing 3-5 steps with a railing? : A Little 6 Click Score: 22    End of Session Equipment Utilized During Treatment: Gait belt Activity Tolerance: Patient tolerated treatment well Patient left: in chair Nurse Communication: Mobility status PT Visit Diagnosis: Muscle weakness (generalized) (M62.81);Difficulty in walking, not elsewhere classified (R26.2)    Time: 0034-9179 PT Time Calculation (min) (ACUTE ONLY): 33 min   Charges:   PT Evaluation $PT Eval Low Complexity: 1 Low            Virgina Organ, PT CLT [phone] 07/31/2020, 12:19 PM

## 2020-07-31 NOTE — ED Provider Notes (Addendum)
[facility] EMERGENCY DEPARTMENT Provider Note   CSN: [id number] Arrival date & time: 07/30/20  1732     History No chief complaint on file.   Cynthia Guzman is a 86 y.o. female.  Patient referred in from urgent care.  Patient in urgent care was noted to have hypoxia.  Oxygen saturations went down in the 80s.  Patient's had both Covid vaccines.  Patient with complaint of shortness of breath this morning and some chest pressure.  Patient not on home oxygen.  Patient initially placed on 4 L here but her oxygen level up to 99%.  Probably referred in from the urgent care due to the hypoxia.        Past Medical History:  Diagnosis Date  . Chronic back pain   . Complication of anesthesia    hard to wake up  . Depression   . eczema   . Glaucoma   . Hearing impairment   . Hypertension   . Memory impairment   . Osteoporosis     Patient Active Problem List   Diagnosis Date Noted  . Hypoxia 07/30/2020  . Dementia with behavioral disturbance (HCC) 09/20/2019  . CAP (community acquired pneumonia) 10/25/2018  . Essential hypertension 10/25/2018  . Dizziness 10/25/2018  . Hypertensive urgency 10/25/2018  . Chronic back pain 10/25/2018  . HLD (hyperlipidemia) 10/25/2018  . Postop Acute blood loss anemia 11/19/2012  . OA (osteoarthritis) of knee 11/18/2012    Past Surgical History:  Procedure Laterality Date  . ABDOMINAL HYSTERECTOMY    . BACK SURGERY     x 3  . DILATION AND CURETTAGE OF UTERUS    . EYE SURGERY     bilateral cataracts with lens implants  . HAND SURGERY     left hand surgery  . TOTAL KNEE ARTHROPLASTY  11/18/2012   Procedure: TOTAL KNEE ARTHROPLASTY;  Surgeon: Loanne Drilling, MD;  Location: [facility];  Service: Orthopedics;  Laterality: Right;     OB History   No obstetric history on file.     Family History  Problem Relation Age of Onset  . Alzheimer's disease Other        "all of her aunts and her mother" per daughter    Social History    Tobacco Use  . Smoking status: Former Smoker    Packs/day: 1.00    Years: 60.00    Pack years: 60.00    Types: Cigarettes    Quit date: 12/26/2015    Years since quitting: 4.6  . Smokeless tobacco: Never Used  Vaping Use  . Vaping Use: Never used  Substance Use Topics  . Alcohol use: No  . Drug use: No    Home Medications Prior to Admission medications   Medication Sig Start Date End Date Taking? Authorizing Provider  amLODipine (NORVASC) 5 MG tablet Take 1 tablet (5 mg total) by mouth at bedtime. Patient not taking: Reported on 09/18/2019 10/27/18   Maretta Bees, MD  aspirin EC 81 MG tablet Take 81 mg by mouth daily.    [provider]  buprenorphine (BUTRANS) 10 MCG/HR PTWK patch APPLY 1 PATCH ONCE PER WEEK 08/27/19   [provider]  cephALEXin (KEFLEX) 500 MG capsule Take 1 capsule (500 mg total) by mouth 3 (three) times daily. 10/01/19   Lorre Nick, MD  Cholecalciferol (VITAMIN D) 2000 units CAPS Take 2,000 Units by mouth daily.    [provider]  dorzolamide-timolol (COSOPT) 22.3-6.8 MG/ML ophthalmic solution Place 1 drop into  both eyes 2 (two) times daily.    [provider]  latanoprost (XALATAN) 0.005 % ophthalmic solution Place 1 drop into both eyes at bedtime.    [provider]  methocarbamol (ROBAXIN) 500 MG tablet Take 1 tablet (500 mg total) by mouth every 6 (six) hours as needed. Patient not taking: Reported on 10/25/2018 11/20/12   Julien Girt, Alexzandrew L, PA-C  metoprolol (LOPRESSOR) 50 MG tablet Take 50 mg by mouth daily.     [provider]  morphine (MSIR) 30 MG tablet Take 30 mg by mouth every 6 (six) hours as needed for moderate pain. Pain    [provider]  ondansetron (ZOFRAN) 4 MG tablet Take 1 tablet (4 mg total) by mouth every 8 (eight) hours as needed for nausea or vomiting. Patient not taking: Reported on 09/18/2019 11/05/18   Carlyle Basques P, PA-C  oxyCODONE (OXY IR/ROXICODONE) 5 MG  immediate release tablet Take 1-2 tablets (5-10 mg total) by mouth every 3 (three) hours as needed. Patient not taking: Reported on 10/25/2018 11/20/12   Julien Girt, Alexzandrew L, PA-C  Probiotic Product (ALIGN PO) Take by mouth.    [provider]  promethazine (PHENERGAN) 25 MG tablet Take 25 mg by mouth 4 (four) times daily.    [provider]  sertraline (ZOLOFT) 100 MG tablet Take 150 mg by mouth at bedtime.     [provider]  simvastatin (ZOCOR) 20 MG tablet Take 20 mg by mouth at bedtime.     [provider]  telmisartan (MICARDIS) 40 MG tablet Take 40 mg by mouth daily. 08/03/19   [provider]  zoledronic acid (RECLAST) 5 MG/100ML SOLN injection Inject 5 mg into the vein once.    [provider]    Allergies    Arthritis pain regimen [aspirin]  Review of Systems   Review of Systems  Constitutional: Negative for chills and fever.  HENT: Negative for rhinorrhea and sore throat.   Eyes: Negative for visual disturbance.  Respiratory: Positive for shortness of breath. Negative for cough.   Cardiovascular: Positive for chest pain. Negative for leg swelling.  Gastrointestinal: Negative for abdominal pain, diarrhea, nausea and vomiting.  Genitourinary: Negative for dysuria.  Musculoskeletal: Negative for back pain and neck pain.  Skin: Negative for rash.  Neurological: Negative for dizziness, light-headedness and headaches.  Hematological: Does not bruise/bleed easily.  Psychiatric/Behavioral: Negative for confusion.    Physical Exam Updated Vital Signs BP 139/63   Pulse 69   Temp 98.6 F (37 C) (Oral)   Resp 12   SpO2 94%   Physical Exam Vitals and nursing note reviewed.  Constitutional:      General: She is not in acute distress.    Appearance: Normal appearance. She is well-developed.  HENT:     Head: Normocephalic and atraumatic.  Eyes:     Extraocular Movements: Extraocular movements intact.      Conjunctiva/sclera: Conjunctivae normal.     Pupils: Pupils are equal, round, and reactive to light.  Cardiovascular:     Rate and Rhythm: Normal rate and regular rhythm.     Heart sounds: No murmur heard.   Pulmonary:     Effort: Pulmonary effort is normal. No respiratory distress.     Breath sounds: Normal breath sounds. No wheezing, rhonchi or rales.  Abdominal:     Palpations: Abdomen is soft.     Tenderness: There is no abdominal tenderness.  Musculoskeletal:        General: No swelling.  Cervical back: Neck supple.  Skin:    General: Skin is warm and dry.     Capillary Refill: Capillary refill takes less than 2 seconds.  Neurological:     General: No focal deficit present.     Mental Status: She is alert and oriented to person, place, and time.     Cranial Nerves: No cranial nerve deficit.     Sensory: No sensory deficit.     Motor: No weakness.     Comments: Patient very hard of hearing     ED Results / Procedures / Treatments   Labs (all labs ordered are listed, but only abnormal results are displayed) Labs Reviewed  BRAIN NATRIURETIC PEPTIDE - Abnormal; Notable for the following components:      Result Value   B Natriuretic Peptide 211.0 (*)    All other components within normal limits  COMPREHENSIVE METABOLIC PANEL - Abnormal; Notable for the following components:   Glucose, Bld 121 (*)    Calcium 8.3 (*)    GFR calc non Af Amer 54 (*)    All other components within normal limits  CBC WITH DIFFERENTIAL/PLATELET - Abnormal; Notable for the following components:   Eosinophils Absolute 0.8 (*)    All other components within normal limits  SARS CORONAVIRUS 2 BY RT PCR (HOSPITAL ORDER[facility])  TROPONIN I (HIGH SENSITIVITY)  TROPONIN I (HIGH SENSITIVITY)    EKG EKG Interpretation  Date/Time:  Friday July 30 2020 17:46:19 EDT Ventricular Rate:  58 PR Interval:    QRS Duration: 82 QT Interval:  454 QTC Calculation: 446 R  Axis:   69 Text Interpretation: Sinus rhythm Low voltage, precordial leads Confirmed by Vanetta MuldersZackowski, Arihant Pennings [phone]([id number]) on 07/30/2020 6:03:21 PM   Radiology CT Angio Chest PE W/Cm &/Or Wo Cm  Result Date: 07/30/2020 CLINICAL DATA:  Chest pressure and shortness of breath EXAM: CT ANGIOGRAPHY CHEST WITH CONTRAST TECHNIQUE: Multidetector CT imaging of the chest was performed using the standard protocol during bolus administration of intravenous contrast. Multiplanar CT image reconstructions and MIPs were obtained to evaluate the vascular anatomy. CONTRAST:  100mL OMNIPAQUE IOHEXOL 350 MG/ML SOLN COMPARISON:  None. FINDINGS: Cardiovascular: There is a optimal opacification of the pulmonary arteries. There is no central,segmental, or subsegmental filling defects within the pulmonary arteries. There is moderate cardiomegaly present. Coronary artery calcifications are present. There is scattered aortic atherosclerosis is present. There is normal three-vessel brachiocephalic anatomy without proximal stenosis. Mediastinum/Nodes: No hilar, mediastinal, or axillary adenopathy. Thyroid gland, trachea, and esophagus demonstrate no significant findings. Lungs/Pleura: Minimal bibasilar dependent atelectasis seen. No pleural effusion or pneumothorax. No airspace consolidation. Upper Abdomen: No acute abnormalities present in the visualized portions of the upper abdomen. Musculoskeletal: No chest wall abnormality. No acute or significant osseous findings. Review of the MIP images confirms the above findings. IMPRESSION: No central, segmental, or subsegmental pulmonary embolism. Aortic Atherosclerosis (ICD10-I70.0). Minimal bibasilar dependent atelectasis. Electronically Signed   By: Jonna ClarkBindu  Avutu M.D.   On: 07/30/2020 22:48   DG Chest Port 1 View  Result Date: 07/30/2020 CLINICAL DATA:  Shortness of breath and lethargy. EXAM: PORTABLE CHEST 1 VIEW COMPARISON:  August 12, 2019 FINDINGS: Mild, diffuse chronic appearing increased  interstitial lung markings are noted. Very mild bibasilar atelectasis and/or infiltrate is noted. There is no evidence of a pleural effusion or pneumothorax. The heart size and mediastinal contours are within normal limits. There is moderate severity calcification of the aortic arch. Degenerative changes seen within the thoracic spine.  IMPRESSION: Mild, diffuse chronic appearing increased interstitial lung markings with very mild bibasilar atelectasis and/or infiltrate. Electronically Signed   By: Aram Candela M.D.   On: 07/30/2020 18:29    Procedures Procedures (including critical care time)  Medications Ordered in ED Medications  enoxaparin (LOVENOX) injection 40 mg (has no administration in time range)  acetaminophen (TYLENOL) tablet 650 mg (has no administration in time range)    Or  acetaminophen (TYLENOL) suppository 650 mg (has no administration in time range)  ondansetron (ZOFRAN) tablet 4 mg (has no administration in time range)    Or  ondansetron (ZOFRAN) injection 4 mg (has no administration in time range)  iohexol (OMNIPAQUE) 350 MG/ML injection 100 mL (100 mLs Intravenous Contrast Given 07/30/20 2235)    ED Course  I have reviewed the triage vital signs and the nursing notes.  Pertinent labs & imaging results that were available during my care of the patient were reviewed by me and considered in my medical decision making (see chart for details).    MDM Rules/Calculators/A&P                          Patient referred here from urgent care for hypoxia.  Extensive work-up to try to figure out the source of the hypoxia.  Covid was negative BNP up a little bit at 211 chest x-ray without any distinct findings.  CT angio chest no pulmonary embolus just atelectasis may be some emphysema changes.  But patient persistently in the seems to need 2 L of oxygen.  She will desat into the 80s without it.  Hospitalist contacted for admission.  Patient's daughter stated that she was told  that her mother needed oxygen consideration may be about a month ago when she got her osteoporosis injection.  Patient not on home oxygen.  So this low oxygen situation may be a chronic state.  May be related to COPD.  Hospitalist will admit.  In addition patient did complain of some chest tightness.  But troponins here are negative x2.  EKG without any acute changes.  Final Clinical Impression(s) / ED Diagnoses Final diagnoses:  SOB (shortness of breath)  Hypoxia    Rx / DC Orders ED Discharge Orders    None       Vanetta Mulders, MD 07/31/20 Leanord Hawking    Vanetta Mulders, MD 07/31/20 [phone]

## 2020-08-04 DIAGNOSIS — J439 Emphysema, unspecified: Secondary | ICD-10-CM | POA: Diagnosis not present

## 2020-08-05 DIAGNOSIS — Z79899 Other long term (current) drug therapy: Secondary | ICD-10-CM | POA: Diagnosis not present

## 2020-08-05 DIAGNOSIS — M545 Low back pain: Secondary | ICD-10-CM | POA: Diagnosis not present

## 2020-08-05 DIAGNOSIS — G894 Chronic pain syndrome: Secondary | ICD-10-CM | POA: Diagnosis not present

## 2020-08-05 DIAGNOSIS — G8929 Other chronic pain: Secondary | ICD-10-CM | POA: Diagnosis not present

## 2020-08-17 DIAGNOSIS — H409 Unspecified glaucoma: Secondary | ICD-10-CM | POA: Diagnosis not present

## 2020-08-17 DIAGNOSIS — I129 Hypertensive chronic kidney disease with stage 1 through stage 4 chronic kidney disease, or unspecified chronic kidney disease: Secondary | ICD-10-CM | POA: Diagnosis not present

## 2020-08-17 DIAGNOSIS — G309 Alzheimer's disease, unspecified: Secondary | ICD-10-CM | POA: Diagnosis not present

## 2020-08-17 DIAGNOSIS — E785 Hyperlipidemia, unspecified: Secondary | ICD-10-CM | POA: Diagnosis not present

## 2020-08-17 DIAGNOSIS — R0902 Hypoxemia: Secondary | ICD-10-CM | POA: Diagnosis not present

## 2020-08-17 DIAGNOSIS — N1831 Chronic kidney disease, stage 3a: Secondary | ICD-10-CM | POA: Diagnosis not present

## 2020-09-04 DIAGNOSIS — J439 Emphysema, unspecified: Secondary | ICD-10-CM | POA: Diagnosis not present

## 2020-09-08 DIAGNOSIS — R05 Cough: Secondary | ICD-10-CM | POA: Diagnosis not present

## 2020-09-08 DIAGNOSIS — J0100 Acute maxillary sinusitis, unspecified: Secondary | ICD-10-CM | POA: Diagnosis not present

## 2020-09-08 DIAGNOSIS — Z1152 Encounter for screening for COVID-19: Secondary | ICD-10-CM | POA: Diagnosis not present

## 2020-10-04 DIAGNOSIS — M545 Low back pain, unspecified: Secondary | ICD-10-CM | POA: Diagnosis not present

## 2020-10-04 DIAGNOSIS — J439 Emphysema, unspecified: Secondary | ICD-10-CM | POA: Diagnosis not present

## 2020-10-04 DIAGNOSIS — Z79899 Other long term (current) drug therapy: Secondary | ICD-10-CM | POA: Diagnosis not present

## 2020-10-04 DIAGNOSIS — G8929 Other chronic pain: Secondary | ICD-10-CM | POA: Diagnosis not present

## 2020-10-04 DIAGNOSIS — G894 Chronic pain syndrome: Secondary | ICD-10-CM | POA: Diagnosis not present

## 2020-10-13 DIAGNOSIS — Z23 Encounter for immunization: Secondary | ICD-10-CM | POA: Diagnosis not present

## 2020-11-04 DIAGNOSIS — G8929 Other chronic pain: Secondary | ICD-10-CM | POA: Diagnosis not present

## 2020-11-04 DIAGNOSIS — Z79899 Other long term (current) drug therapy: Secondary | ICD-10-CM | POA: Diagnosis not present

## 2020-11-04 DIAGNOSIS — J439 Emphysema, unspecified: Secondary | ICD-10-CM | POA: Diagnosis not present

## 2020-11-04 DIAGNOSIS — M545 Low back pain, unspecified: Secondary | ICD-10-CM | POA: Diagnosis not present

## 2020-11-04 DIAGNOSIS — G894 Chronic pain syndrome: Secondary | ICD-10-CM | POA: Diagnosis not present

## 2020-12-04 DIAGNOSIS — J439 Emphysema, unspecified: Secondary | ICD-10-CM | POA: Diagnosis not present

## 2021-01-04 DIAGNOSIS — J439 Emphysema, unspecified: Secondary | ICD-10-CM | POA: Diagnosis not present

## 2021-01-10 DIAGNOSIS — M545 Low back pain, unspecified: Secondary | ICD-10-CM | POA: Diagnosis not present

## 2021-01-10 DIAGNOSIS — Z79899 Other long term (current) drug therapy: Secondary | ICD-10-CM | POA: Diagnosis not present

## 2021-01-10 DIAGNOSIS — G8929 Other chronic pain: Secondary | ICD-10-CM | POA: Diagnosis not present

## 2021-01-10 DIAGNOSIS — G894 Chronic pain syndrome: Secondary | ICD-10-CM | POA: Diagnosis not present

## 2021-01-24 DIAGNOSIS — N3281 Overactive bladder: Secondary | ICD-10-CM | POA: Diagnosis not present

## 2021-01-24 DIAGNOSIS — M47816 Spondylosis without myelopathy or radiculopathy, lumbar region: Secondary | ICD-10-CM | POA: Diagnosis not present

## 2021-01-24 DIAGNOSIS — N1831 Chronic kidney disease, stage 3a: Secondary | ICD-10-CM | POA: Diagnosis not present

## 2021-01-24 DIAGNOSIS — I129 Hypertensive chronic kidney disease with stage 1 through stage 4 chronic kidney disease, or unspecified chronic kidney disease: Secondary | ICD-10-CM | POA: Diagnosis not present

## 2021-01-24 DIAGNOSIS — I739 Peripheral vascular disease, unspecified: Secondary | ICD-10-CM | POA: Diagnosis not present

## 2021-01-24 DIAGNOSIS — E7800 Pure hypercholesterolemia, unspecified: Secondary | ICD-10-CM | POA: Diagnosis not present

## 2021-01-24 DIAGNOSIS — G309 Alzheimer's disease, unspecified: Secondary | ICD-10-CM | POA: Diagnosis not present

## 2021-01-24 DIAGNOSIS — D631 Anemia in chronic kidney disease: Secondary | ICD-10-CM | POA: Diagnosis not present

## 2021-01-24 DIAGNOSIS — R2689 Other abnormalities of gait and mobility: Secondary | ICD-10-CM | POA: Diagnosis not present

## 2021-02-04 DIAGNOSIS — J439 Emphysema, unspecified: Secondary | ICD-10-CM | POA: Diagnosis not present

## 2021-02-07 DIAGNOSIS — H04321 Acute dacryocystitis of right lacrimal passage: Secondary | ICD-10-CM | POA: Diagnosis not present

## 2021-02-07 DIAGNOSIS — H40053 Ocular hypertension, bilateral: Secondary | ICD-10-CM | POA: Diagnosis not present

## 2021-02-11 DIAGNOSIS — G8929 Other chronic pain: Secondary | ICD-10-CM | POA: Diagnosis not present

## 2021-02-11 DIAGNOSIS — Z79899 Other long term (current) drug therapy: Secondary | ICD-10-CM | POA: Diagnosis not present

## 2021-02-11 DIAGNOSIS — G894 Chronic pain syndrome: Secondary | ICD-10-CM | POA: Diagnosis not present

## 2021-02-11 DIAGNOSIS — M545 Low back pain, unspecified: Secondary | ICD-10-CM | POA: Diagnosis not present

## 2021-02-17 DIAGNOSIS — H04301 Unspecified dacryocystitis of right lacrimal passage: Secondary | ICD-10-CM | POA: Diagnosis not present

## 2021-02-23 DIAGNOSIS — H04551 Acquired stenosis of right nasolacrimal duct: Secondary | ICD-10-CM | POA: Diagnosis not present

## 2021-02-23 DIAGNOSIS — H04223 Epiphora due to insufficient drainage, bilateral lacrimal glands: Secondary | ICD-10-CM | POA: Diagnosis not present

## 2021-02-23 DIAGNOSIS — H02413 Mechanical ptosis of bilateral eyelids: Secondary | ICD-10-CM | POA: Diagnosis not present

## 2021-02-23 DIAGNOSIS — H04411 Chronic dacryocystitis of right lacrimal passage: Secondary | ICD-10-CM | POA: Diagnosis not present

## 2021-02-23 DIAGNOSIS — G308 Other Alzheimer's disease: Secondary | ICD-10-CM | POA: Diagnosis not present

## 2021-02-23 DIAGNOSIS — H0012 Chalazion right lower eyelid: Secondary | ICD-10-CM | POA: Diagnosis not present

## 2021-03-04 DIAGNOSIS — J439 Emphysema, unspecified: Secondary | ICD-10-CM | POA: Diagnosis not present

## 2021-03-11 DIAGNOSIS — M545 Low back pain, unspecified: Secondary | ICD-10-CM | POA: Diagnosis not present

## 2021-03-11 DIAGNOSIS — Z9189 Other specified personal risk factors, not elsewhere classified: Secondary | ICD-10-CM | POA: Diagnosis not present

## 2021-03-11 DIAGNOSIS — Z79899 Other long term (current) drug therapy: Secondary | ICD-10-CM | POA: Diagnosis not present

## 2021-03-11 DIAGNOSIS — G8929 Other chronic pain: Secondary | ICD-10-CM | POA: Diagnosis not present

## 2021-04-03 ENCOUNTER — Encounter (HOSPITAL_COMMUNITY): Payer: Self-pay

## 2021-04-03 ENCOUNTER — Emergency Department (HOSPITAL_COMMUNITY): Payer: Medicare HMO

## 2021-04-03 ENCOUNTER — Other Ambulatory Visit: Payer: Self-pay

## 2021-04-03 ENCOUNTER — Emergency Department (HOSPITAL_COMMUNITY)
Admission: EM | Admit: 2021-04-03 | Discharge: 2021-04-03 | Disposition: A | Discharge status: Home / Self Care | Payer: Medicare HMO | Attending: Emergency Medicine | Admitting: Emergency Medicine

## 2021-04-03 DIAGNOSIS — I10 Essential (primary) hypertension: Secondary | ICD-10-CM | POA: Insufficient documentation

## 2021-04-03 DIAGNOSIS — R531 Weakness: Secondary | ICD-10-CM | POA: Insufficient documentation

## 2021-04-03 DIAGNOSIS — Z96651 Presence of right artificial knee joint: Secondary | ICD-10-CM | POA: Diagnosis not present

## 2021-04-03 DIAGNOSIS — Z20822 Contact with and (suspected) exposure to covid-19: Secondary | ICD-10-CM | POA: Insufficient documentation

## 2021-04-03 DIAGNOSIS — R5381 Other malaise: Secondary | ICD-10-CM | POA: Insufficient documentation

## 2021-04-03 DIAGNOSIS — J439 Emphysema, unspecified: Secondary | ICD-10-CM | POA: Diagnosis not present

## 2021-04-03 DIAGNOSIS — R4182 Altered mental status, unspecified: Secondary | ICD-10-CM | POA: Insufficient documentation

## 2021-04-03 DIAGNOSIS — M47816 Spondylosis without myelopathy or radiculopathy, lumbar region: Secondary | ICD-10-CM | POA: Diagnosis not present

## 2021-04-03 DIAGNOSIS — R410 Disorientation, unspecified: Secondary | ICD-10-CM | POA: Diagnosis not present

## 2021-04-03 DIAGNOSIS — R059 Cough, unspecified: Secondary | ICD-10-CM | POA: Insufficient documentation

## 2021-04-03 DIAGNOSIS — F0391 Unspecified dementia with behavioral disturbance: Secondary | ICD-10-CM | POA: Insufficient documentation

## 2021-04-03 DIAGNOSIS — Z79899 Other long term (current) drug therapy: Secondary | ICD-10-CM | POA: Diagnosis not present

## 2021-04-03 DIAGNOSIS — R109 Unspecified abdominal pain: Secondary | ICD-10-CM | POA: Diagnosis not present

## 2021-04-03 DIAGNOSIS — R1110 Vomiting, unspecified: Secondary | ICD-10-CM | POA: Diagnosis not present

## 2021-04-03 DIAGNOSIS — I700 Atherosclerosis of aorta: Secondary | ICD-10-CM | POA: Diagnosis not present

## 2021-04-03 DIAGNOSIS — Z87891 Personal history of nicotine dependence: Secondary | ICD-10-CM | POA: Diagnosis not present

## 2021-04-03 DIAGNOSIS — M549 Dorsalgia, unspecified: Secondary | ICD-10-CM | POA: Diagnosis not present

## 2021-04-03 DIAGNOSIS — K8689 Other specified diseases of pancreas: Secondary | ICD-10-CM | POA: Diagnosis not present

## 2021-04-03 DIAGNOSIS — Z7982 Long term (current) use of aspirin: Secondary | ICD-10-CM | POA: Diagnosis not present

## 2021-04-03 DIAGNOSIS — G4489 Other headache syndrome: Secondary | ICD-10-CM | POA: Diagnosis not present

## 2021-04-03 DIAGNOSIS — R102 Pelvic and perineal pain: Secondary | ICD-10-CM | POA: Diagnosis not present

## 2021-04-03 DIAGNOSIS — R404 Transient alteration of awareness: Secondary | ICD-10-CM | POA: Diagnosis not present

## 2021-04-03 LAB — CBC WITH DIFFERENTIAL/PLATELET
Abs Immature Granulocytes: 0.05 10*3/uL (ref 0.00–0.07)
Basophils Absolute: 0 10*3/uL (ref 0.0–0.1)
Basophils Relative: 0 %
Eosinophils Absolute: 0 10*3/uL (ref 0.0–0.5)
Eosinophils Relative: 0 %
HCT: 43.3 % (ref 36.0–46.0)
Hemoglobin: 14.5 g/dL (ref 12.0–15.0)
Immature Granulocytes: 1 %
Lymphocytes Relative: 16 %
Lymphs Abs: 1.6 10*3/uL (ref 0.7–4.0)
MCH: 29.3 pg (ref 26.0–34.0)
MCHC: 33.5 g/dL (ref 30.0–36.0)
MCV: 87.5 fL (ref 80.0–100.0)
Monocytes Absolute: 1 10*3/uL (ref 0.1–1.0)
Monocytes Relative: 10 %
Neutro Abs: 7.2 10*3/uL (ref 1.7–7.7)
Neutrophils Relative %: 73 %
Platelets: 200 10*3/uL (ref 150–400)
RBC: 4.95 MIL/uL (ref 3.87–5.11)
RDW: 13.4 % (ref 11.5–15.5)
WBC: 9.9 10*3/uL (ref 4.0–10.5)
nRBC: 0 % (ref 0.0–0.2)

## 2021-04-03 LAB — RESP PANEL BY RT-PCR (FLU A&B, COVID) ARPGX2
Influenza A by PCR: NEGATIVE
Influenza B by PCR: NEGATIVE
SARS Coronavirus 2 by RT PCR: NEGATIVE

## 2021-04-03 LAB — URINALYSIS, ROUTINE W REFLEX MICROSCOPIC
Bacteria, UA: NONE SEEN
Bilirubin Urine: NEGATIVE
Glucose, UA: NEGATIVE mg/dL
Ketones, ur: 5 mg/dL — AB
Leukocytes,Ua: NEGATIVE
Nitrite: NEGATIVE
Protein, ur: 100 mg/dL — AB
Specific Gravity, Urine: 1.011 (ref 1.005–1.030)
pH: 5 (ref 5.0–8.0)

## 2021-04-03 LAB — COMPREHENSIVE METABOLIC PANEL
ALT: 15 U/L (ref 0–44)
AST: 21 U/L (ref 15–41)
Albumin: 4.2 g/dL (ref 3.5–5.0)
Alkaline Phosphatase: 52 U/L (ref 38–126)
Anion gap: 8 (ref 5–15)
BUN: 17 mg/dL (ref 8–23)
CO2: 28 mmol/L (ref 22–32)
Calcium: 10 mg/dL (ref 8.9–10.3)
Chloride: 103 mmol/L (ref 98–111)
Creatinine, Ser: 1.03 mg/dL — ABNORMAL HIGH (ref 0.44–1.00)
GFR, Estimated: 53 mL/min — ABNORMAL LOW (ref >60)
Glucose, Bld: 128 mg/dL — ABNORMAL HIGH (ref 70–99)
Potassium: 4.1 mmol/L (ref 3.5–5.1)
Sodium: 139 mmol/L (ref 135–145)
Total Bilirubin: 1.1 mg/dL (ref 0.3–1.2)
Total Protein: 7.4 g/dL (ref 6.5–8.1)

## 2021-04-03 LAB — LACTIC ACID, PLASMA: Lactic Acid, Venous: 1.2 mmol/L (ref 0.5–1.9)

## 2021-04-03 MED ORDER — SODIUM CHLORIDE 0.9 % IV BOLUS
1000.0000 mL | Freq: Once | INTRAVENOUS | Status: AC
  Administered 2021-04-03: 1000 mL via INTRAVENOUS

## 2021-04-03 MED ORDER — IOHEXOL 300 MG/ML  SOLN
100.0000 mL | Freq: Once | INTRAMUSCULAR | Status: AC | PRN
  Administered 2021-04-03: 100 mL via INTRAVENOUS

## 2021-04-03 MED ORDER — ACETAMINOPHEN 325 MG PO TABS
650.0000 mg | ORAL_TABLET | Freq: Once | ORAL | Status: AC
  Administered 2021-04-03: 650 mg via ORAL
  Filled 2021-04-03: qty 2

## 2021-04-03 NOTE — ED Triage Notes (Signed)
Per EMS: Family states worsening weakness and AMS.

## 2021-04-03 NOTE — Discharge Instructions (Addendum)
Continue all of her medicines today as prescribed.  Make sure she is eating and drinking regularly.  Follow-up with your doctor for checkup later this week.

## 2021-04-03 NOTE — ED Notes (Signed)
Pt discharged and wheeled out of the ED in a wheel chair without difficulty. 

## 2021-04-03 NOTE — ED Provider Notes (Signed)
[facility] EMERGENCY DEPARTMENT Provider Note   CSN: [id number] Arrival date & time: 04/03/21  1444     History Chief Complaint  Patient presents with  . Altered Mental Status    Cynthia Guzman is a 87 y.o. female.  HPI She presents for multiple symptoms, which started yesterday including cough, vomiting, confusion, weakness.  She did not take nightly medicines last night or this morning.  She did not eat anything today.  No known sick contacts.  No blood in emesis.  No known fever.  She has had COVID vaccines and a booster.  Patient is unable to give any history.  History is given by patient's daughter who was in the room with her.  Level 5 caveat-high acuity    Past Medical History:  Diagnosis Date  . Chronic back pain   . Complication of anesthesia    hard to wake up  . Depression   . eczema   . Glaucoma   . Hearing impairment   . Hypertension   . Memory impairment   . Osteoporosis     Patient Active Problem List   Diagnosis Date Noted  . Glaucoma   . Hypoxia 07/30/2020  . Dementia with behavioral disturbance (HCC) 09/20/2019  . CAP (community acquired pneumonia) 10/25/2018  . Essential hypertension 10/25/2018  . Dizziness 10/25/2018  . Hypertensive urgency 10/25/2018  . Chronic back pain 10/25/2018  . HLD (hyperlipidemia) 10/25/2018  . Postop Acute blood loss anemia 11/19/2012  . OA (osteoarthritis) of knee 11/18/2012    Past Surgical History:  Procedure Laterality Date  . ABDOMINAL HYSTERECTOMY    . BACK SURGERY     x 3  . DILATION AND CURETTAGE OF UTERUS    . EYE SURGERY     bilateral cataracts with lens implants  . HAND SURGERY     left hand surgery  . TOTAL KNEE ARTHROPLASTY  11/18/2012   Procedure: TOTAL KNEE ARTHROPLASTY;  Surgeon: Loanne Drilling, MD;  Location: [facility];  Service: Orthopedics;  Laterality: Right;     OB History   No obstetric history on file.     Family History  Problem Relation Age of Onset  .  Alzheimer's disease Other        "all of her aunts and her mother" per daughter    Social History   Tobacco Use  . Smoking status: Former Smoker    Packs/day: 1.00    Years: 60.00    Pack years: 60.00    Types: Cigarettes    Quit date: 12/26/2015    Years since quitting: 5.2  . Smokeless tobacco: Never Used  Vaping Use  . Vaping Use: Never used  Substance Use Topics  . Alcohol use: No  . Drug use: No    Home Medications Prior to Admission medications   Medication Sig Start Date End Date Taking? Authorizing Provider  amLODipine (NORVASC) 5 MG tablet Take 1 tablet (5 mg total) by mouth at bedtime. 10/27/18   Ghimire, Werner Lean, MD  aspirin EC 81 MG tablet Take 81 mg by mouth daily.    [provider]  buprenorphine (BUTRANS) 20 MCG/HR PTWK Place 1 patch onto the skin once a week.  08/27/19   [provider]  calcium carbonate (TUMS) 500 MG chewable tablet Chew 1 tablet (200 mg of elemental calcium total) by mouth daily. 07/31/20 07/31/21  Cleora Fleet, MD  Cholecalciferol (VITAMIN D) 2000 units CAPS Take 2,000 Units by mouth daily.  [provider]  dorzolamide-timolol (COSOPT) 22.3-6.8 MG/ML ophthalmic solution Place 1 drop into both eyes 2 (two) times daily.    [provider]  latanoprost (XALATAN) 0.005 % ophthalmic solution Place 1 drop into both eyes at bedtime.    [provider]  metoprolol (LOPRESSOR) 50 MG tablet Take 50 mg by mouth daily.     [provider]  Probiotic Product (ALIGN PO) Take by mouth.    [provider]  promethazine (PHENERGAN) 25 MG tablet Take 25 mg by mouth 4 (four) times daily.    [provider]  sertraline (ZOLOFT) 100 MG tablet Take 150 mg by mouth at bedtime.     [provider]  simvastatin (ZOCOR) 20 MG tablet Take 20 mg by mouth at bedtime.     [provider]  telmisartan (MICARDIS) 40 MG tablet Take 40 mg by mouth daily. 08/03/19   [provider]    Allergies    Arthritis pain regimen [aspirin]  Review of Systems   Review of Systems  Unable to perform ROS: Other    Physical Exam Updated Vital Signs BP (!) 155/83 (BP Location: Right Arm)   Pulse 73   Temp 99.1 F (37.3 C) (Oral)   Resp 18   SpO2 96%   Physical Exam Vitals and nursing note reviewed.  Constitutional:      General: She is not in acute distress.    Appearance: She is well-developed. She is obese. She is ill-appearing. She is not toxic-appearing or diaphoretic.     Comments: Elderly, frail  HENT:     Head: Normocephalic and atraumatic.     Right Ear: External ear normal.     Left Ear: External ear normal.     Mouth/Throat:     Mouth: Mucous membranes are moist.     Pharynx: No oropharyngeal exudate or posterior oropharyngeal erythema.  Eyes:     Conjunctiva/sclera: Conjunctivae normal.     Pupils: Pupils are equal, round, and reactive to light.  Neck:     Trachea: Phonation normal.  Cardiovascular:     Rate and Rhythm: Normal rate and regular rhythm.     Heart sounds: Normal heart sounds.     Comments: Blood pressure elevated Pulmonary:     Effort: Pulmonary effort is normal. No respiratory distress.     Breath sounds: Normal breath sounds. No stridor. No rhonchi.  Abdominal:     General: There is no distension.     Palpations: Abdomen is soft. There is no mass.     Tenderness: There is no abdominal tenderness.     Hernia: No hernia is present.  Musculoskeletal:        General: No tenderness or deformity. Normal range of motion.     Cervical back: Normal range of motion and neck supple.     Right lower leg: No edema.     Left lower leg: No edema.  Skin:    General: Skin is warm and dry.  Neurological:     Mental Status: She is alert.     Cranial Nerves: No cranial nerve deficit.     Motor: No abnormal muscle tone.     Coordination: Coordination normal.  Psychiatric:        Mood and Affect: Mood normal.        Behavior: Behavior  normal.     ED Results / Procedures / Treatments   Labs (all labs ordered are listed, but only abnormal results are displayed)  Labs Reviewed  COMPREHENSIVE METABOLIC PANEL - Abnormal; Notable for the following components:      Result Value   Glucose, Bld 128 (*)    Creatinine, Ser 1.03 (*)    GFR, Estimated 53 (*)    All other components within normal limits  URINALYSIS, ROUTINE W REFLEX MICROSCOPIC - Abnormal; Notable for the following components:   Hgb urine dipstick MODERATE (*)    Ketones, ur 5 (*)    Protein, ur 100 (*)    All other components within normal limits  RESP PANEL BY RT-PCR (FLU A&B, COVID) ARPGX2  CULTURE, BLOOD (ROUTINE X 2)  CULTURE, BLOOD (ROUTINE X 2)  CBC WITH DIFFERENTIAL/PLATELET  LACTIC ACID, PLASMA  BLOOD GAS, VENOUS  LACTIC ACID, PLASMA    EKG EKG Interpretation  Date/Time:  Sunday April 03 2021 14:51:43 EDT Ventricular Rate:  76 PR Interval:  182 QRS Duration: 78 QT Interval:  390 QTC Calculation: 439 R Axis:   24 Text Interpretation: Sinus or ectopic atrial rhythm since last tracing no significant change Confirmed by Mancel Bale [phone]) on 04/03/2021 3:19:31 PM   Radiology CT Abdomen Pelvis W Contrast  Result Date: 04/03/2021 CLINICAL DATA:  87 year old female acute abdominal and pelvic pain. EXAM: CT ABDOMEN AND PELVIS WITH CONTRAST TECHNIQUE: Multidetector CT imaging of the abdomen and pelvis was performed using the standard protocol following bolus administration of intravenous contrast. CONTRAST:  OMNIPAQUE IOHEXOL 300 MG/ML  SOLN COMPARISON:  08/23/2019 CT and prior studies FINDINGS: Lower chest: No acute abnormality. Hepatobiliary: The liver and gallbladder are unremarkable except for tiny stable hepatic cysts. No biliary dilatation. Pancreas: Atrophic without other abnormality Spleen: Unremarkable Adrenals/Urinary Tract: The kidneys, adrenal glands and bladder are unremarkable except for unchanged LEFT renal scarring.  Stomach/Bowel: Stomach is within normal limits. No evidence of bowel wall thickening, distention, or inflammatory changes. Vascular/Lymphatic: Aortic atherosclerosis. No enlarged abdominal or pelvic lymph nodes. Reproductive: Status post hysterectomy. No adnexal masses. Other: No ascites, focal collection or pneumoperitoneum. No abdominal wall hernia identified. Musculoskeletal: No acute or suspicious bony abnormalities identified. L4-5 fusion changes and multilevel degenerative disc disease/spondylosis within the lumbar spine again noted. IMPRESSION: 1. No evidence of acute abnormality. 2. Aortic Atherosclerosis (ICD10-I70.0). Electronically Signed   By: Harmon Pier M.D.   On: 04/03/2021 18:25   DG Chest Port 1 View  Result Date: 04/03/2021 CLINICAL DATA:  87 year old with acute onset of cough, generalized weakness and worsening mental status. EXAM: PORTABLE CHEST 1 VIEW COMPARISON:  07/30/2020 and earlier, including CTA chest of that same date. FINDINGS: Cardiac silhouette normal in size, unchanged. Thoracic aorta tortuous and mildly atherosclerotic, unchanged. Hilar and mediastinal contours otherwise unremarkable. Emphysematous changes as noted on the prior CT. Mild central peribronchial thickening, unchanged. Lungs otherwise clear. Pulmonary vascularity normal. No visible pleural effusions. No pneumothorax. IMPRESSION: COPD/emphysema. No acute cardiopulmonary disease. Electronically Signed   By: Hulan Saas M.D.   On: 04/03/2021 17:08    Procedures Procedures   Medications Ordered in ED Medications  sodium chloride 0.9 % bolus 1,000 mL (1,000 mLs Intravenous New Bag/Given 04/03/21 1641)  iohexol (OMNIPAQUE) 300 MG/ML solution 100 mL (100 mLs Intravenous Contrast Given 04/03/21 1748)  acetaminophen (TYLENOL) tablet 650 mg (650 mg Oral Given 04/03/21 1928)    ED Course  I have reviewed the triage vital signs and the nursing notes.  Pertinent labs & imaging results that were available during  my care of the patient were reviewed by me and considered in my medical decision making (see  chart for details).    MDM Rules/Calculators/A&P                           Patient Vitals for the past 24 hrs:  BP Temp Temp src Pulse Resp SpO2  04/03/21 1940 (!) 155/83 99.1 F (37.3 C) Oral 73 18 96 %  04/03/21 1715 (!) 179/94 -- -- 77 16 96 %  04/03/21 1646 -- (!) 100.4 F (38 C) Rectal -- -- --  04/03/21 1642 (!) 155/85 -- -- 82 16 99 %  04/03/21 1530 (!) 176/81 -- -- 75 14 94 %  04/03/21 1500 (!) 196/94 -- -- 78 19 97 %  04/03/21 1452 -- -- -- -- -- 96 %  04/03/21 1451 (!) 209/95 99.3 F (37.4 C) Oral 75 17 --    8:09 PM Reevaluation with update and discussion. After initial assessment and treatment, an updated evaluation reveals patient alert cooperative.  Findings discussed with patient's daughter at the bedside, all questions answered. Mancel BaleElliott Jemarion Roycroft   Medical Decision Making:  This patient is presenting for evaluation of cough and vomiting, which does require a range of treatment options, and is a complaint that involves a high risk of morbidity and mortality. The differential diagnoses include pneumonia, viral infection, bowel obstruction, gastroenteritis. I decided to review old records, and in summary Eh[place] female presenting with symptoms starting yesterday characterized by cough and vomiting.  No prior abdominal surgery.  Here with her daughter, who states patient has been confused today.  She did not take her usual medicines this morning and presents with high blood pressure..  I obtained additional historical information from daughter at bedside.  Clinical Laboratory Tests Ordered, included CBC, Metabolic panel, Urinalysis and Lactate, blood cultures, viral panel. Review indicates essentially normal findings. Radiologic Tests Ordered, included chest x-ray, CT abdomen pelvis.  I independently Visualized: Radiograph images, which show normal  Critical Interventions-clinical  evaluation, IV fluids, laboratory testing, CT imaging, observation and reassessment  After These Interventions, the Patient was reevaluated and was found with reassuring evaluation for her nonspecific symptoms.  No indication for hospitalization at this time.  I encouraged the daughter to give her usual daily medicines today, push oral intake and follow-up with her PCP for problems  CRITICAL CARE-none Performed by: Mancel BaleElliott Laretta Pyatt  Nursing Notes Reviewed/ Care Coordinated Applicable Imaging Reviewed Interpretation of Laboratory Data incorporated into ED treatment  The patient appears reasonably screened and/or stabilized for discharge and I doubt any other medical condition or other [facility] requiring further screening, evaluation, or treatment in the ED at this time prior to discharge.  Plan: Home Medications-continue usual; Home Treatments-rest, fluids; return here if the recommended treatment, does not improve the symptoms; Recommended follow up-PCP follow-up 1 week and as needed     Final Clinical Impression(s) / ED Diagnoses Final diagnoses:  Altered mental status, unspecified altered mental status type  Malaise    Rx / DC Orders ED Discharge Orders    None       Mancel BaleWentz, Aniketh Huberty, MD 04/03/21 2011

## 2021-04-03 NOTE — ED Triage Notes (Signed)
PT has N/V since yesterday

## 2021-04-04 DIAGNOSIS — J439 Emphysema, unspecified: Secondary | ICD-10-CM | POA: Diagnosis not present

## 2021-04-08 LAB — CULTURE, BLOOD (ROUTINE X 2)
Culture: NO GROWTH
Culture: NO GROWTH
Special Requests: ADEQUATE

## 2021-04-12 DIAGNOSIS — H9193 Unspecified hearing loss, bilateral: Secondary | ICD-10-CM | POA: Diagnosis not present

## 2021-04-12 DIAGNOSIS — N3281 Overactive bladder: Secondary | ICD-10-CM | POA: Diagnosis not present

## 2021-04-12 DIAGNOSIS — R2689 Other abnormalities of gait and mobility: Secondary | ICD-10-CM | POA: Diagnosis not present

## 2021-04-12 DIAGNOSIS — G479 Sleep disorder, unspecified: Secondary | ICD-10-CM | POA: Diagnosis not present

## 2021-04-12 DIAGNOSIS — N1831 Chronic kidney disease, stage 3a: Secondary | ICD-10-CM | POA: Diagnosis not present

## 2021-04-12 DIAGNOSIS — I129 Hypertensive chronic kidney disease with stage 1 through stage 4 chronic kidney disease, or unspecified chronic kidney disease: Secondary | ICD-10-CM | POA: Diagnosis not present

## 2021-04-12 DIAGNOSIS — D631 Anemia in chronic kidney disease: Secondary | ICD-10-CM | POA: Diagnosis not present

## 2021-04-12 DIAGNOSIS — G309 Alzheimer's disease, unspecified: Secondary | ICD-10-CM | POA: Diagnosis not present

## 2021-04-15 DIAGNOSIS — M545 Low back pain, unspecified: Secondary | ICD-10-CM | POA: Diagnosis not present

## 2021-04-15 DIAGNOSIS — G8929 Other chronic pain: Secondary | ICD-10-CM | POA: Diagnosis not present

## 2021-04-15 DIAGNOSIS — G894 Chronic pain syndrome: Secondary | ICD-10-CM | POA: Diagnosis not present

## 2021-04-15 DIAGNOSIS — Z79899 Other long term (current) drug therapy: Secondary | ICD-10-CM | POA: Diagnosis not present

## 2021-04-15 DIAGNOSIS — Z6831 Body mass index (BMI) 31.0-31.9, adult: Secondary | ICD-10-CM | POA: Diagnosis not present

## 2021-05-04 DIAGNOSIS — J439 Emphysema, unspecified: Secondary | ICD-10-CM | POA: Diagnosis not present

## 2021-05-12 DIAGNOSIS — H04321 Acute dacryocystitis of right lacrimal passage: Secondary | ICD-10-CM | POA: Diagnosis not present

## 2021-05-12 DIAGNOSIS — H40053 Ocular hypertension, bilateral: Secondary | ICD-10-CM | POA: Diagnosis not present

## 2021-05-12 DIAGNOSIS — Z961 Presence of intraocular lens: Secondary | ICD-10-CM | POA: Diagnosis not present

## 2021-05-12 DIAGNOSIS — H18593 Other hereditary corneal dystrophies, bilateral: Secondary | ICD-10-CM | POA: Diagnosis not present

## 2021-05-13 DIAGNOSIS — G894 Chronic pain syndrome: Secondary | ICD-10-CM | POA: Diagnosis not present

## 2021-05-13 DIAGNOSIS — M545 Low back pain, unspecified: Secondary | ICD-10-CM | POA: Diagnosis not present

## 2021-05-13 DIAGNOSIS — Z9189 Other specified personal risk factors, not elsewhere classified: Secondary | ICD-10-CM | POA: Diagnosis not present

## 2021-05-13 DIAGNOSIS — G8929 Other chronic pain: Secondary | ICD-10-CM | POA: Diagnosis not present

## 2021-05-26 DIAGNOSIS — H5340 Unspecified visual field defects: Secondary | ICD-10-CM | POA: Diagnosis not present

## 2021-06-04 DIAGNOSIS — J439 Emphysema, unspecified: Secondary | ICD-10-CM | POA: Diagnosis not present

## 2021-06-17 DIAGNOSIS — G8929 Other chronic pain: Secondary | ICD-10-CM | POA: Diagnosis not present

## 2021-06-17 DIAGNOSIS — Z9181 History of falling: Secondary | ICD-10-CM | POA: Diagnosis not present

## 2021-06-17 DIAGNOSIS — Z6831 Body mass index (BMI) 31.0-31.9, adult: Secondary | ICD-10-CM | POA: Diagnosis not present

## 2021-06-17 DIAGNOSIS — M545 Low back pain, unspecified: Secondary | ICD-10-CM | POA: Diagnosis not present

## 2021-06-17 DIAGNOSIS — Z79899 Other long term (current) drug therapy: Secondary | ICD-10-CM | POA: Diagnosis not present

## 2021-06-17 DIAGNOSIS — G894 Chronic pain syndrome: Secondary | ICD-10-CM | POA: Diagnosis not present

## 2021-06-22 DIAGNOSIS — Z79899 Other long term (current) drug therapy: Secondary | ICD-10-CM | POA: Diagnosis not present

## 2021-06-29 DIAGNOSIS — Z79899 Other long term (current) drug therapy: Secondary | ICD-10-CM | POA: Diagnosis not present

## 2021-07-04 DIAGNOSIS — J439 Emphysema, unspecified: Secondary | ICD-10-CM | POA: Diagnosis not present

## 2021-07-05 DIAGNOSIS — E7800 Pure hypercholesterolemia, unspecified: Secondary | ICD-10-CM | POA: Diagnosis not present

## 2021-07-05 DIAGNOSIS — E559 Vitamin D deficiency, unspecified: Secondary | ICD-10-CM | POA: Diagnosis not present

## 2021-07-15 DIAGNOSIS — G8929 Other chronic pain: Secondary | ICD-10-CM | POA: Diagnosis not present

## 2021-07-15 DIAGNOSIS — Z9189 Other specified personal risk factors, not elsewhere classified: Secondary | ICD-10-CM | POA: Diagnosis not present

## 2021-07-15 DIAGNOSIS — G894 Chronic pain syndrome: Secondary | ICD-10-CM | POA: Diagnosis not present

## 2021-07-15 DIAGNOSIS — M545 Low back pain, unspecified: Secondary | ICD-10-CM | POA: Diagnosis not present

## 2021-07-21 DIAGNOSIS — N1831 Chronic kidney disease, stage 3a: Secondary | ICD-10-CM | POA: Diagnosis not present

## 2021-07-21 DIAGNOSIS — H04321 Acute dacryocystitis of right lacrimal passage: Secondary | ICD-10-CM | POA: Diagnosis not present

## 2021-07-21 DIAGNOSIS — I129 Hypertensive chronic kidney disease with stage 1 through stage 4 chronic kidney disease, or unspecified chronic kidney disease: Secondary | ICD-10-CM | POA: Diagnosis not present

## 2021-07-21 DIAGNOSIS — H40053 Ocular hypertension, bilateral: Secondary | ICD-10-CM | POA: Diagnosis not present

## 2021-07-21 DIAGNOSIS — H16141 Punctate keratitis, right eye: Secondary | ICD-10-CM | POA: Diagnosis not present

## 2021-07-21 DIAGNOSIS — R0902 Hypoxemia: Secondary | ICD-10-CM | POA: Diagnosis not present

## 2021-07-26 DIAGNOSIS — E7800 Pure hypercholesterolemia, unspecified: Secondary | ICD-10-CM | POA: Diagnosis not present

## 2021-07-26 DIAGNOSIS — Z0000 Encounter for general adult medical examination without abnormal findings: Secondary | ICD-10-CM | POA: Diagnosis not present

## 2021-07-26 DIAGNOSIS — N1831 Chronic kidney disease, stage 3a: Secondary | ICD-10-CM | POA: Diagnosis not present

## 2021-07-26 DIAGNOSIS — M47816 Spondylosis without myelopathy or radiculopathy, lumbar region: Secondary | ICD-10-CM | POA: Diagnosis not present

## 2021-07-26 DIAGNOSIS — I739 Peripheral vascular disease, unspecified: Secondary | ICD-10-CM | POA: Diagnosis not present

## 2021-07-26 DIAGNOSIS — G309 Alzheimer's disease, unspecified: Secondary | ICD-10-CM | POA: Diagnosis not present

## 2021-07-26 DIAGNOSIS — I129 Hypertensive chronic kidney disease with stage 1 through stage 4 chronic kidney disease, or unspecified chronic kidney disease: Secondary | ICD-10-CM | POA: Diagnosis not present

## 2021-07-26 DIAGNOSIS — M810 Age-related osteoporosis without current pathological fracture: Secondary | ICD-10-CM | POA: Diagnosis not present

## 2021-07-26 DIAGNOSIS — D631 Anemia in chronic kidney disease: Secondary | ICD-10-CM | POA: Diagnosis not present

## 2021-07-26 DIAGNOSIS — R82998 Other abnormal findings in urine: Secondary | ICD-10-CM | POA: Diagnosis not present

## 2021-08-01 ENCOUNTER — Other Ambulatory Visit: Payer: Self-pay | Admitting: Ophthalmology

## 2021-08-01 DIAGNOSIS — H04221 Epiphora due to insufficient drainage, right lacrimal gland: Secondary | ICD-10-CM | POA: Diagnosis not present

## 2021-08-01 DIAGNOSIS — H0012 Chalazion right lower eyelid: Secondary | ICD-10-CM | POA: Diagnosis not present

## 2021-08-01 DIAGNOSIS — H04561 Stenosis of right lacrimal punctum: Secondary | ICD-10-CM | POA: Diagnosis not present

## 2021-08-01 DIAGNOSIS — H04511 Dacryolith of right lacrimal passage: Secondary | ICD-10-CM | POA: Diagnosis not present

## 2021-08-01 DIAGNOSIS — H02413 Mechanical ptosis of bilateral eyelids: Secondary | ICD-10-CM | POA: Diagnosis not present

## 2021-08-01 DIAGNOSIS — H04551 Acquired stenosis of right nasolacrimal duct: Secondary | ICD-10-CM | POA: Diagnosis not present

## 2021-08-01 DIAGNOSIS — H04411 Chronic dacryocystitis of right lacrimal passage: Secondary | ICD-10-CM | POA: Diagnosis not present

## 2021-08-01 DIAGNOSIS — H04223 Epiphora due to insufficient drainage, bilateral lacrimal glands: Secondary | ICD-10-CM | POA: Diagnosis not present

## 2021-08-12 DIAGNOSIS — H04551 Acquired stenosis of right nasolacrimal duct: Secondary | ICD-10-CM | POA: Diagnosis not present

## 2021-08-12 DIAGNOSIS — H04411 Chronic dacryocystitis of right lacrimal passage: Secondary | ICD-10-CM | POA: Diagnosis not present

## 2021-08-12 DIAGNOSIS — H02413 Mechanical ptosis of bilateral eyelids: Secondary | ICD-10-CM | POA: Diagnosis not present

## 2021-08-12 DIAGNOSIS — H04223 Epiphora due to insufficient drainage, bilateral lacrimal glands: Secondary | ICD-10-CM | POA: Diagnosis not present

## 2021-08-18 DIAGNOSIS — M545 Low back pain, unspecified: Secondary | ICD-10-CM | POA: Diagnosis not present

## 2021-08-18 DIAGNOSIS — G8929 Other chronic pain: Secondary | ICD-10-CM | POA: Diagnosis not present

## 2021-08-18 DIAGNOSIS — G894 Chronic pain syndrome: Secondary | ICD-10-CM | POA: Diagnosis not present

## 2021-08-18 DIAGNOSIS — T753XXA Motion sickness, initial encounter: Secondary | ICD-10-CM | POA: Diagnosis not present

## 2021-08-25 DIAGNOSIS — H04223 Epiphora due to insufficient drainage, bilateral lacrimal glands: Secondary | ICD-10-CM | POA: Diagnosis not present

## 2021-08-25 DIAGNOSIS — H04411 Chronic dacryocystitis of right lacrimal passage: Secondary | ICD-10-CM | POA: Diagnosis not present

## 2021-08-25 DIAGNOSIS — H04551 Acquired stenosis of right nasolacrimal duct: Secondary | ICD-10-CM | POA: Diagnosis not present

## 2021-08-25 DIAGNOSIS — H02413 Mechanical ptosis of bilateral eyelids: Secondary | ICD-10-CM | POA: Diagnosis not present

## 2021-09-08 DIAGNOSIS — H02413 Mechanical ptosis of bilateral eyelids: Secondary | ICD-10-CM | POA: Diagnosis not present

## 2021-09-08 DIAGNOSIS — H04551 Acquired stenosis of right nasolacrimal duct: Secondary | ICD-10-CM | POA: Diagnosis not present

## 2021-09-08 DIAGNOSIS — H04411 Chronic dacryocystitis of right lacrimal passage: Secondary | ICD-10-CM | POA: Diagnosis not present

## 2021-09-08 DIAGNOSIS — H04223 Epiphora due to insufficient drainage, bilateral lacrimal glands: Secondary | ICD-10-CM | POA: Diagnosis not present

## 2021-09-23 DIAGNOSIS — M810 Age-related osteoporosis without current pathological fracture: Secondary | ICD-10-CM | POA: Diagnosis not present

## 2021-09-23 DIAGNOSIS — N1831 Chronic kidney disease, stage 3a: Secondary | ICD-10-CM | POA: Diagnosis not present

## 2021-09-23 DIAGNOSIS — E7800 Pure hypercholesterolemia, unspecified: Secondary | ICD-10-CM | POA: Diagnosis not present

## 2021-09-23 DIAGNOSIS — I129 Hypertensive chronic kidney disease with stage 1 through stage 4 chronic kidney disease, or unspecified chronic kidney disease: Secondary | ICD-10-CM | POA: Diagnosis not present

## 2021-10-10 DIAGNOSIS — M545 Low back pain, unspecified: Secondary | ICD-10-CM | POA: Diagnosis not present

## 2021-10-10 DIAGNOSIS — M179 Osteoarthritis of knee, unspecified: Secondary | ICD-10-CM | POA: Diagnosis not present

## 2021-10-10 DIAGNOSIS — Z79899 Other long term (current) drug therapy: Secondary | ICD-10-CM | POA: Diagnosis not present

## 2021-10-10 DIAGNOSIS — G894 Chronic pain syndrome: Secondary | ICD-10-CM | POA: Diagnosis not present

## 2021-10-10 DIAGNOSIS — E669 Obesity, unspecified: Secondary | ICD-10-CM | POA: Diagnosis not present

## 2021-10-10 DIAGNOSIS — Z6831 Body mass index (BMI) 31.0-31.9, adult: Secondary | ICD-10-CM | POA: Diagnosis not present

## 2021-10-13 DIAGNOSIS — Z79899 Other long term (current) drug therapy: Secondary | ICD-10-CM | POA: Diagnosis not present

## 2021-10-24 DIAGNOSIS — N1831 Chronic kidney disease, stage 3a: Secondary | ICD-10-CM | POA: Diagnosis not present

## 2021-10-24 DIAGNOSIS — I129 Hypertensive chronic kidney disease with stage 1 through stage 4 chronic kidney disease, or unspecified chronic kidney disease: Secondary | ICD-10-CM | POA: Diagnosis not present

## 2021-10-24 DIAGNOSIS — M810 Age-related osteoporosis without current pathological fracture: Secondary | ICD-10-CM | POA: Diagnosis not present

## 2021-10-24 DIAGNOSIS — E7800 Pure hypercholesterolemia, unspecified: Secondary | ICD-10-CM | POA: Diagnosis not present

## 2021-11-14 DIAGNOSIS — Z79899 Other long term (current) drug therapy: Secondary | ICD-10-CM | POA: Diagnosis not present

## 2021-11-14 DIAGNOSIS — G8929 Other chronic pain: Secondary | ICD-10-CM | POA: Diagnosis not present

## 2021-11-14 DIAGNOSIS — G894 Chronic pain syndrome: Secondary | ICD-10-CM | POA: Diagnosis not present

## 2021-11-14 DIAGNOSIS — M545 Low back pain, unspecified: Secondary | ICD-10-CM | POA: Diagnosis not present

## 2021-12-02 DIAGNOSIS — M1712 Unilateral primary osteoarthritis, left knee: Secondary | ICD-10-CM | POA: Diagnosis not present

## 2021-12-02 DIAGNOSIS — M170 Bilateral primary osteoarthritis of knee: Secondary | ICD-10-CM | POA: Diagnosis not present

## 2021-12-22 DIAGNOSIS — Z6831 Body mass index (BMI) 31.0-31.9, adult: Secondary | ICD-10-CM | POA: Diagnosis not present

## 2021-12-22 DIAGNOSIS — M545 Low back pain, unspecified: Secondary | ICD-10-CM | POA: Diagnosis not present

## 2021-12-22 DIAGNOSIS — G8929 Other chronic pain: Secondary | ICD-10-CM | POA: Diagnosis not present

## 2021-12-22 DIAGNOSIS — E669 Obesity, unspecified: Secondary | ICD-10-CM | POA: Diagnosis not present

## 2021-12-22 DIAGNOSIS — G894 Chronic pain syndrome: Secondary | ICD-10-CM | POA: Diagnosis not present

## 2021-12-23 DIAGNOSIS — M1990 Unspecified osteoarthritis, unspecified site: Secondary | ICD-10-CM | POA: Diagnosis not present

## 2021-12-23 DIAGNOSIS — N1831 Chronic kidney disease, stage 3a: Secondary | ICD-10-CM | POA: Diagnosis not present

## 2021-12-23 DIAGNOSIS — I129 Hypertensive chronic kidney disease with stage 1 through stage 4 chronic kidney disease, or unspecified chronic kidney disease: Secondary | ICD-10-CM | POA: Diagnosis not present

## 2021-12-23 DIAGNOSIS — E7800 Pure hypercholesterolemia, unspecified: Secondary | ICD-10-CM | POA: Diagnosis not present

## 2022-01-19 DIAGNOSIS — G894 Chronic pain syndrome: Secondary | ICD-10-CM | POA: Diagnosis not present

## 2022-01-19 DIAGNOSIS — Z9189 Other specified personal risk factors, not elsewhere classified: Secondary | ICD-10-CM | POA: Diagnosis not present

## 2022-01-19 DIAGNOSIS — J449 Chronic obstructive pulmonary disease, unspecified: Secondary | ICD-10-CM | POA: Diagnosis not present

## 2022-01-19 DIAGNOSIS — G8929 Other chronic pain: Secondary | ICD-10-CM | POA: Diagnosis not present

## 2022-01-19 DIAGNOSIS — Z79899 Other long term (current) drug therapy: Secondary | ICD-10-CM | POA: Diagnosis not present

## 2022-01-19 DIAGNOSIS — H18593 Other hereditary corneal dystrophies, bilateral: Secondary | ICD-10-CM | POA: Diagnosis not present

## 2022-01-19 DIAGNOSIS — M545 Low back pain, unspecified: Secondary | ICD-10-CM | POA: Diagnosis not present

## 2022-01-19 DIAGNOSIS — H40053 Ocular hypertension, bilateral: Secondary | ICD-10-CM | POA: Diagnosis not present

## 2022-01-19 DIAGNOSIS — H16141 Punctate keratitis, right eye: Secondary | ICD-10-CM | POA: Diagnosis not present

## 2022-01-19 DIAGNOSIS — H04321 Acute dacryocystitis of right lacrimal passage: Secondary | ICD-10-CM | POA: Diagnosis not present

## 2022-01-19 DIAGNOSIS — Z961 Presence of intraocular lens: Secondary | ICD-10-CM | POA: Diagnosis not present

## 2022-02-02 DIAGNOSIS — M1712 Unilateral primary osteoarthritis, left knee: Secondary | ICD-10-CM | POA: Diagnosis not present

## 2022-02-09 DIAGNOSIS — M1712 Unilateral primary osteoarthritis, left knee: Secondary | ICD-10-CM | POA: Diagnosis not present

## 2022-02-17 DIAGNOSIS — Z6831 Body mass index (BMI) 31.0-31.9, adult: Secondary | ICD-10-CM | POA: Diagnosis not present

## 2022-02-17 DIAGNOSIS — E559 Vitamin D deficiency, unspecified: Secondary | ICD-10-CM | POA: Diagnosis not present

## 2022-02-17 DIAGNOSIS — M1712 Unilateral primary osteoarthritis, left knee: Secondary | ICD-10-CM | POA: Diagnosis not present

## 2022-02-17 DIAGNOSIS — E669 Obesity, unspecified: Secondary | ICD-10-CM | POA: Diagnosis not present

## 2022-02-17 DIAGNOSIS — Z79899 Other long term (current) drug therapy: Secondary | ICD-10-CM | POA: Diagnosis not present

## 2022-02-17 DIAGNOSIS — G894 Chronic pain syndrome: Secondary | ICD-10-CM | POA: Diagnosis not present

## 2022-02-17 DIAGNOSIS — G8929 Other chronic pain: Secondary | ICD-10-CM | POA: Diagnosis not present

## 2022-02-17 DIAGNOSIS — Z9181 History of falling: Secondary | ICD-10-CM | POA: Diagnosis not present

## 2022-02-17 DIAGNOSIS — M545 Low back pain, unspecified: Secondary | ICD-10-CM | POA: Diagnosis not present

## 2022-02-17 DIAGNOSIS — Z0000 Encounter for general adult medical examination without abnormal findings: Secondary | ICD-10-CM | POA: Diagnosis not present

## 2022-02-23 DIAGNOSIS — Z79899 Other long term (current) drug therapy: Secondary | ICD-10-CM | POA: Diagnosis not present

## 2022-03-02 DIAGNOSIS — E7800 Pure hypercholesterolemia, unspecified: Secondary | ICD-10-CM | POA: Diagnosis not present

## 2022-03-02 DIAGNOSIS — M47816 Spondylosis without myelopathy or radiculopathy, lumbar region: Secondary | ICD-10-CM | POA: Diagnosis not present

## 2022-03-02 DIAGNOSIS — R2689 Other abnormalities of gait and mobility: Secondary | ICD-10-CM | POA: Diagnosis not present

## 2022-03-02 DIAGNOSIS — D692 Other nonthrombocytopenic purpura: Secondary | ICD-10-CM | POA: Diagnosis not present

## 2022-03-02 DIAGNOSIS — I129 Hypertensive chronic kidney disease with stage 1 through stage 4 chronic kidney disease, or unspecified chronic kidney disease: Secondary | ICD-10-CM | POA: Diagnosis not present

## 2022-03-02 DIAGNOSIS — I739 Peripheral vascular disease, unspecified: Secondary | ICD-10-CM | POA: Diagnosis not present

## 2022-03-02 DIAGNOSIS — N1831 Chronic kidney disease, stage 3a: Secondary | ICD-10-CM | POA: Diagnosis not present

## 2022-03-02 DIAGNOSIS — M810 Age-related osteoporosis without current pathological fracture: Secondary | ICD-10-CM | POA: Diagnosis not present

## 2022-03-02 DIAGNOSIS — G309 Alzheimer's disease, unspecified: Secondary | ICD-10-CM | POA: Diagnosis not present

## 2022-03-02 DIAGNOSIS — D631 Anemia in chronic kidney disease: Secondary | ICD-10-CM | POA: Diagnosis not present

## 2022-03-17 DIAGNOSIS — G8929 Other chronic pain: Secondary | ICD-10-CM | POA: Diagnosis not present

## 2022-03-17 DIAGNOSIS — Z6831 Body mass index (BMI) 31.0-31.9, adult: Secondary | ICD-10-CM | POA: Diagnosis not present

## 2022-03-17 DIAGNOSIS — E669 Obesity, unspecified: Secondary | ICD-10-CM | POA: Diagnosis not present

## 2022-03-17 DIAGNOSIS — G894 Chronic pain syndrome: Secondary | ICD-10-CM | POA: Diagnosis not present

## 2022-03-17 DIAGNOSIS — M545 Low back pain, unspecified: Secondary | ICD-10-CM | POA: Diagnosis not present

## 2022-03-22 DIAGNOSIS — E669 Obesity, unspecified: Secondary | ICD-10-CM | POA: Diagnosis not present

## 2022-03-22 DIAGNOSIS — M549 Dorsalgia, unspecified: Secondary | ICD-10-CM | POA: Diagnosis not present

## 2022-03-22 DIAGNOSIS — M545 Low back pain, unspecified: Secondary | ICD-10-CM | POA: Diagnosis not present

## 2022-03-22 DIAGNOSIS — G8929 Other chronic pain: Secondary | ICD-10-CM | POA: Diagnosis not present

## 2022-03-22 DIAGNOSIS — Z79899 Other long term (current) drug therapy: Secondary | ICD-10-CM | POA: Diagnosis not present

## 2022-03-22 DIAGNOSIS — Z6831 Body mass index (BMI) 31.0-31.9, adult: Secondary | ICD-10-CM | POA: Diagnosis not present

## 2022-03-22 DIAGNOSIS — R059 Cough, unspecified: Secondary | ICD-10-CM | POA: Diagnosis not present

## 2022-03-22 DIAGNOSIS — G894 Chronic pain syndrome: Secondary | ICD-10-CM | POA: Diagnosis not present

## 2022-03-24 DIAGNOSIS — I129 Hypertensive chronic kidney disease with stage 1 through stage 4 chronic kidney disease, or unspecified chronic kidney disease: Secondary | ICD-10-CM | POA: Diagnosis not present

## 2022-03-24 DIAGNOSIS — E7800 Pure hypercholesterolemia, unspecified: Secondary | ICD-10-CM | POA: Diagnosis not present

## 2022-03-24 DIAGNOSIS — M1990 Unspecified osteoarthritis, unspecified site: Secondary | ICD-10-CM | POA: Diagnosis not present

## 2022-03-24 DIAGNOSIS — N1831 Chronic kidney disease, stage 3a: Secondary | ICD-10-CM | POA: Diagnosis not present

## 2022-03-27 DIAGNOSIS — Z79899 Other long term (current) drug therapy: Secondary | ICD-10-CM | POA: Diagnosis not present

## 2022-04-06 DIAGNOSIS — M1712 Unilateral primary osteoarthritis, left knee: Secondary | ICD-10-CM | POA: Diagnosis not present

## 2022-04-20 DIAGNOSIS — M179 Osteoarthritis of knee, unspecified: Secondary | ICD-10-CM | POA: Diagnosis not present

## 2022-04-20 DIAGNOSIS — M545 Low back pain, unspecified: Secondary | ICD-10-CM | POA: Diagnosis not present

## 2022-04-20 DIAGNOSIS — G894 Chronic pain syndrome: Secondary | ICD-10-CM | POA: Diagnosis not present

## 2022-04-20 DIAGNOSIS — Z79899 Other long term (current) drug therapy: Secondary | ICD-10-CM | POA: Diagnosis not present

## 2022-04-20 DIAGNOSIS — G8929 Other chronic pain: Secondary | ICD-10-CM | POA: Diagnosis not present

## 2022-04-20 DIAGNOSIS — Z6831 Body mass index (BMI) 31.0-31.9, adult: Secondary | ICD-10-CM | POA: Diagnosis not present

## 2022-04-20 DIAGNOSIS — E669 Obesity, unspecified: Secondary | ICD-10-CM | POA: Diagnosis not present

## 2022-04-24 DIAGNOSIS — Z79899 Other long term (current) drug therapy: Secondary | ICD-10-CM | POA: Diagnosis not present

## 2022-05-11 DIAGNOSIS — G894 Chronic pain syndrome: Secondary | ICD-10-CM | POA: Diagnosis not present

## 2022-05-11 DIAGNOSIS — M545 Low back pain, unspecified: Secondary | ICD-10-CM | POA: Diagnosis not present

## 2022-05-11 DIAGNOSIS — Z9181 History of falling: Secondary | ICD-10-CM | POA: Diagnosis not present

## 2022-05-11 DIAGNOSIS — E669 Obesity, unspecified: Secondary | ICD-10-CM | POA: Diagnosis not present

## 2022-05-11 DIAGNOSIS — M179 Osteoarthritis of knee, unspecified: Secondary | ICD-10-CM | POA: Diagnosis not present

## 2022-05-11 DIAGNOSIS — G8929 Other chronic pain: Secondary | ICD-10-CM | POA: Diagnosis not present

## 2022-05-11 DIAGNOSIS — Z79899 Other long term (current) drug therapy: Secondary | ICD-10-CM | POA: Diagnosis not present

## 2022-05-15 DIAGNOSIS — J40 Bronchitis, not specified as acute or chronic: Secondary | ICD-10-CM | POA: Diagnosis not present

## 2022-05-15 DIAGNOSIS — R051 Acute cough: Secondary | ICD-10-CM | POA: Diagnosis not present

## 2022-05-16 DIAGNOSIS — Z79899 Other long term (current) drug therapy: Secondary | ICD-10-CM | POA: Diagnosis not present

## 2022-05-18 DIAGNOSIS — H04321 Acute dacryocystitis of right lacrimal passage: Secondary | ICD-10-CM | POA: Diagnosis not present

## 2022-05-18 DIAGNOSIS — H16141 Punctate keratitis, right eye: Secondary | ICD-10-CM | POA: Diagnosis not present

## 2022-05-23 DIAGNOSIS — H02413 Mechanical ptosis of bilateral eyelids: Secondary | ICD-10-CM | POA: Diagnosis not present

## 2022-05-23 DIAGNOSIS — H04411 Chronic dacryocystitis of right lacrimal passage: Secondary | ICD-10-CM | POA: Diagnosis not present

## 2022-05-23 DIAGNOSIS — H04223 Epiphora due to insufficient drainage, bilateral lacrimal glands: Secondary | ICD-10-CM | POA: Diagnosis not present

## 2022-05-23 DIAGNOSIS — H04551 Acquired stenosis of right nasolacrimal duct: Secondary | ICD-10-CM | POA: Diagnosis not present

## 2022-06-08 DIAGNOSIS — G8929 Other chronic pain: Secondary | ICD-10-CM | POA: Diagnosis not present

## 2022-06-08 DIAGNOSIS — G894 Chronic pain syndrome: Secondary | ICD-10-CM | POA: Diagnosis not present

## 2022-06-08 DIAGNOSIS — E669 Obesity, unspecified: Secondary | ICD-10-CM | POA: Diagnosis not present

## 2022-06-08 DIAGNOSIS — M545 Low back pain, unspecified: Secondary | ICD-10-CM | POA: Diagnosis not present

## 2022-06-08 DIAGNOSIS — Z79899 Other long term (current) drug therapy: Secondary | ICD-10-CM | POA: Diagnosis not present

## 2022-06-08 DIAGNOSIS — M179 Osteoarthritis of knee, unspecified: Secondary | ICD-10-CM | POA: Diagnosis not present

## 2022-06-08 DIAGNOSIS — Z9181 History of falling: Secondary | ICD-10-CM | POA: Diagnosis not present

## 2022-06-12 DIAGNOSIS — Z79899 Other long term (current) drug therapy: Secondary | ICD-10-CM | POA: Diagnosis not present

## 2022-06-19 DIAGNOSIS — M179 Osteoarthritis of knee, unspecified: Secondary | ICD-10-CM | POA: Diagnosis not present

## 2022-06-19 DIAGNOSIS — Z79899 Other long term (current) drug therapy: Secondary | ICD-10-CM | POA: Diagnosis not present

## 2022-06-19 DIAGNOSIS — G894 Chronic pain syndrome: Secondary | ICD-10-CM | POA: Diagnosis not present

## 2022-06-19 DIAGNOSIS — E669 Obesity, unspecified: Secondary | ICD-10-CM | POA: Diagnosis not present

## 2022-06-19 DIAGNOSIS — Z9181 History of falling: Secondary | ICD-10-CM | POA: Diagnosis not present

## 2022-06-19 DIAGNOSIS — M545 Low back pain, unspecified: Secondary | ICD-10-CM | POA: Diagnosis not present

## 2022-06-19 DIAGNOSIS — G8929 Other chronic pain: Secondary | ICD-10-CM | POA: Diagnosis not present

## 2022-06-23 DIAGNOSIS — N1831 Chronic kidney disease, stage 3a: Secondary | ICD-10-CM | POA: Diagnosis not present

## 2022-06-23 DIAGNOSIS — I229 Subsequent ST elevation (STEMI) myocardial infarction of unspecified site: Secondary | ICD-10-CM | POA: Diagnosis not present

## 2022-07-06 DIAGNOSIS — Z9181 History of falling: Secondary | ICD-10-CM | POA: Diagnosis not present

## 2022-07-06 DIAGNOSIS — E669 Obesity, unspecified: Secondary | ICD-10-CM | POA: Diagnosis not present

## 2022-07-06 DIAGNOSIS — G894 Chronic pain syndrome: Secondary | ICD-10-CM | POA: Diagnosis not present

## 2022-07-06 DIAGNOSIS — G8929 Other chronic pain: Secondary | ICD-10-CM | POA: Diagnosis not present

## 2022-07-06 DIAGNOSIS — M179 Osteoarthritis of knee, unspecified: Secondary | ICD-10-CM | POA: Diagnosis not present

## 2022-07-06 DIAGNOSIS — M545 Low back pain, unspecified: Secondary | ICD-10-CM | POA: Diagnosis not present

## 2022-07-06 DIAGNOSIS — Z79899 Other long term (current) drug therapy: Secondary | ICD-10-CM | POA: Diagnosis not present

## 2022-07-17 ENCOUNTER — Other Ambulatory Visit: Payer: Self-pay | Admitting: Ophthalmology

## 2022-07-17 DIAGNOSIS — H04221 Epiphora due to insufficient drainage, right lacrimal gland: Secondary | ICD-10-CM | POA: Diagnosis not present

## 2022-07-17 DIAGNOSIS — H04411 Chronic dacryocystitis of right lacrimal passage: Secondary | ICD-10-CM | POA: Diagnosis not present

## 2022-07-17 DIAGNOSIS — H04551 Acquired stenosis of right nasolacrimal duct: Secondary | ICD-10-CM | POA: Diagnosis not present

## 2022-07-17 DIAGNOSIS — H02413 Mechanical ptosis of bilateral eyelids: Secondary | ICD-10-CM | POA: Diagnosis not present

## 2022-07-17 DIAGNOSIS — H04301 Unspecified dacryocystitis of right lacrimal passage: Secondary | ICD-10-CM | POA: Diagnosis not present

## 2022-07-17 DIAGNOSIS — H04561 Stenosis of right lacrimal punctum: Secondary | ICD-10-CM | POA: Diagnosis not present

## 2022-07-17 DIAGNOSIS — H04223 Epiphora due to insufficient drainage, bilateral lacrimal glands: Secondary | ICD-10-CM | POA: Diagnosis not present

## 2022-07-28 DIAGNOSIS — H04551 Acquired stenosis of right nasolacrimal duct: Secondary | ICD-10-CM | POA: Diagnosis not present

## 2022-07-28 DIAGNOSIS — H04411 Chronic dacryocystitis of right lacrimal passage: Secondary | ICD-10-CM | POA: Diagnosis not present

## 2022-07-28 DIAGNOSIS — H04223 Epiphora due to insufficient drainage, bilateral lacrimal glands: Secondary | ICD-10-CM | POA: Diagnosis not present

## 2022-07-28 DIAGNOSIS — H02413 Mechanical ptosis of bilateral eyelids: Secondary | ICD-10-CM | POA: Diagnosis not present

## 2022-07-31 DIAGNOSIS — F419 Anxiety disorder, unspecified: Secondary | ICD-10-CM | POA: Diagnosis not present

## 2022-07-31 DIAGNOSIS — E7800 Pure hypercholesterolemia, unspecified: Secondary | ICD-10-CM | POA: Diagnosis not present

## 2022-07-31 DIAGNOSIS — E559 Vitamin D deficiency, unspecified: Secondary | ICD-10-CM | POA: Diagnosis not present

## 2022-07-31 DIAGNOSIS — R7989 Other specified abnormal findings of blood chemistry: Secondary | ICD-10-CM | POA: Diagnosis not present

## 2022-08-03 DIAGNOSIS — M545 Low back pain, unspecified: Secondary | ICD-10-CM | POA: Diagnosis not present

## 2022-08-03 DIAGNOSIS — Z9181 History of falling: Secondary | ICD-10-CM | POA: Diagnosis not present

## 2022-08-03 DIAGNOSIS — E669 Obesity, unspecified: Secondary | ICD-10-CM | POA: Diagnosis not present

## 2022-08-03 DIAGNOSIS — Z79899 Other long term (current) drug therapy: Secondary | ICD-10-CM | POA: Diagnosis not present

## 2022-08-03 DIAGNOSIS — G8929 Other chronic pain: Secondary | ICD-10-CM | POA: Diagnosis not present

## 2022-08-03 DIAGNOSIS — G894 Chronic pain syndrome: Secondary | ICD-10-CM | POA: Diagnosis not present

## 2022-08-03 DIAGNOSIS — M179 Osteoarthritis of knee, unspecified: Secondary | ICD-10-CM | POA: Diagnosis not present

## 2022-08-31 DIAGNOSIS — G8929 Other chronic pain: Secondary | ICD-10-CM | POA: Diagnosis not present

## 2022-08-31 DIAGNOSIS — M545 Low back pain, unspecified: Secondary | ICD-10-CM | POA: Diagnosis not present

## 2022-08-31 DIAGNOSIS — Z9181 History of falling: Secondary | ICD-10-CM | POA: Diagnosis not present

## 2022-08-31 DIAGNOSIS — Z79899 Other long term (current) drug therapy: Secondary | ICD-10-CM | POA: Diagnosis not present

## 2022-08-31 DIAGNOSIS — E669 Obesity, unspecified: Secondary | ICD-10-CM | POA: Diagnosis not present

## 2022-08-31 DIAGNOSIS — G894 Chronic pain syndrome: Secondary | ICD-10-CM | POA: Diagnosis not present

## 2022-08-31 DIAGNOSIS — M179 Osteoarthritis of knee, unspecified: Secondary | ICD-10-CM | POA: Diagnosis not present

## 2022-09-14 DIAGNOSIS — H04551 Acquired stenosis of right nasolacrimal duct: Secondary | ICD-10-CM | POA: Diagnosis not present

## 2022-09-14 DIAGNOSIS — H04223 Epiphora due to insufficient drainage, bilateral lacrimal glands: Secondary | ICD-10-CM | POA: Diagnosis not present

## 2022-09-14 DIAGNOSIS — H02413 Mechanical ptosis of bilateral eyelids: Secondary | ICD-10-CM | POA: Diagnosis not present

## 2022-09-14 DIAGNOSIS — H04411 Chronic dacryocystitis of right lacrimal passage: Secondary | ICD-10-CM | POA: Diagnosis not present

## 2022-09-14 DIAGNOSIS — H10022 Other mucopurulent conjunctivitis, left eye: Secondary | ICD-10-CM | POA: Diagnosis not present

## 2022-09-15 DIAGNOSIS — G309 Alzheimer's disease, unspecified: Secondary | ICD-10-CM | POA: Diagnosis not present

## 2022-09-15 DIAGNOSIS — M810 Age-related osteoporosis without current pathological fracture: Secondary | ICD-10-CM | POA: Diagnosis not present

## 2022-09-15 DIAGNOSIS — E7800 Pure hypercholesterolemia, unspecified: Secondary | ICD-10-CM | POA: Diagnosis not present

## 2022-09-15 DIAGNOSIS — M47816 Spondylosis without myelopathy or radiculopathy, lumbar region: Secondary | ICD-10-CM | POA: Diagnosis not present

## 2022-09-15 DIAGNOSIS — I739 Peripheral vascular disease, unspecified: Secondary | ICD-10-CM | POA: Diagnosis not present

## 2022-09-15 DIAGNOSIS — D631 Anemia in chronic kidney disease: Secondary | ICD-10-CM | POA: Diagnosis not present

## 2022-09-15 DIAGNOSIS — Z23 Encounter for immunization: Secondary | ICD-10-CM | POA: Diagnosis not present

## 2022-09-15 DIAGNOSIS — Z1331 Encounter for screening for depression: Secondary | ICD-10-CM | POA: Diagnosis not present

## 2022-09-15 DIAGNOSIS — N1831 Chronic kidney disease, stage 3a: Secondary | ICD-10-CM | POA: Diagnosis not present

## 2022-09-15 DIAGNOSIS — I129 Hypertensive chronic kidney disease with stage 1 through stage 4 chronic kidney disease, or unspecified chronic kidney disease: Secondary | ICD-10-CM | POA: Diagnosis not present

## 2022-09-15 DIAGNOSIS — Z0000 Encounter for general adult medical examination without abnormal findings: Secondary | ICD-10-CM | POA: Diagnosis not present

## 2022-09-15 DIAGNOSIS — Z1339 Encounter for screening examination for other mental health and behavioral disorders: Secondary | ICD-10-CM | POA: Diagnosis not present

## 2022-09-28 DIAGNOSIS — Z9181 History of falling: Secondary | ICD-10-CM | POA: Diagnosis not present

## 2022-09-28 DIAGNOSIS — M545 Low back pain, unspecified: Secondary | ICD-10-CM | POA: Diagnosis not present

## 2022-09-28 DIAGNOSIS — G8929 Other chronic pain: Secondary | ICD-10-CM | POA: Diagnosis not present

## 2022-09-28 DIAGNOSIS — M179 Osteoarthritis of knee, unspecified: Secondary | ICD-10-CM | POA: Diagnosis not present

## 2022-09-28 DIAGNOSIS — G894 Chronic pain syndrome: Secondary | ICD-10-CM | POA: Diagnosis not present

## 2022-09-28 DIAGNOSIS — E669 Obesity, unspecified: Secondary | ICD-10-CM | POA: Diagnosis not present

## 2022-09-28 DIAGNOSIS — Z79899 Other long term (current) drug therapy: Secondary | ICD-10-CM | POA: Diagnosis not present

## 2022-10-05 DIAGNOSIS — H04551 Acquired stenosis of right nasolacrimal duct: Secondary | ICD-10-CM | POA: Diagnosis not present

## 2022-10-05 DIAGNOSIS — H04223 Epiphora due to insufficient drainage, bilateral lacrimal glands: Secondary | ICD-10-CM | POA: Diagnosis not present

## 2022-10-05 DIAGNOSIS — H02413 Mechanical ptosis of bilateral eyelids: Secondary | ICD-10-CM | POA: Diagnosis not present

## 2022-10-05 DIAGNOSIS — H10022 Other mucopurulent conjunctivitis, left eye: Secondary | ICD-10-CM | POA: Diagnosis not present

## 2022-10-05 DIAGNOSIS — H04411 Chronic dacryocystitis of right lacrimal passage: Secondary | ICD-10-CM | POA: Diagnosis not present

## 2022-10-12 DIAGNOSIS — M810 Age-related osteoporosis without current pathological fracture: Secondary | ICD-10-CM | POA: Diagnosis not present

## 2022-10-12 DIAGNOSIS — Z23 Encounter for immunization: Secondary | ICD-10-CM | POA: Diagnosis not present

## 2022-10-18 DIAGNOSIS — M1712 Unilateral primary osteoarthritis, left knee: Secondary | ICD-10-CM | POA: Diagnosis not present

## 2022-10-24 DIAGNOSIS — M1712 Unilateral primary osteoarthritis, left knee: Secondary | ICD-10-CM | POA: Diagnosis not present

## 2022-10-26 DIAGNOSIS — I129 Hypertensive chronic kidney disease with stage 1 through stage 4 chronic kidney disease, or unspecified chronic kidney disease: Secondary | ICD-10-CM | POA: Diagnosis not present

## 2022-10-26 DIAGNOSIS — N1831 Chronic kidney disease, stage 3a: Secondary | ICD-10-CM | POA: Diagnosis not present

## 2022-10-26 DIAGNOSIS — M810 Age-related osteoporosis without current pathological fracture: Secondary | ICD-10-CM | POA: Diagnosis not present

## 2022-11-02 DIAGNOSIS — Z9181 History of falling: Secondary | ICD-10-CM | POA: Diagnosis not present

## 2022-11-02 DIAGNOSIS — M179 Osteoarthritis of knee, unspecified: Secondary | ICD-10-CM | POA: Diagnosis not present

## 2022-11-02 DIAGNOSIS — M1712 Unilateral primary osteoarthritis, left knee: Secondary | ICD-10-CM | POA: Diagnosis not present

## 2022-11-02 DIAGNOSIS — G8929 Other chronic pain: Secondary | ICD-10-CM | POA: Diagnosis not present

## 2022-11-02 DIAGNOSIS — Z79899 Other long term (current) drug therapy: Secondary | ICD-10-CM | POA: Diagnosis not present

## 2022-11-02 DIAGNOSIS — M545 Low back pain, unspecified: Secondary | ICD-10-CM | POA: Diagnosis not present

## 2022-11-02 DIAGNOSIS — E669 Obesity, unspecified: Secondary | ICD-10-CM | POA: Diagnosis not present

## 2022-11-02 DIAGNOSIS — G894 Chronic pain syndrome: Secondary | ICD-10-CM | POA: Diagnosis not present

## 2022-11-09 ENCOUNTER — Other Ambulatory Visit (HOSPITAL_COMMUNITY): Payer: Self-pay

## 2022-11-10 ENCOUNTER — Ambulatory Visit (HOSPITAL_COMMUNITY)
Admission: RE | Admit: 2022-11-10 | Discharge: 2022-11-10 | Disposition: A | Discharge status: Home / Self Care | Payer: Medicare HMO | Source: Ambulatory Visit | Attending: Internal Medicine | Admitting: Internal Medicine

## 2022-11-10 DIAGNOSIS — M810 Age-related osteoporosis without current pathological fracture: Secondary | ICD-10-CM | POA: Diagnosis not present

## 2022-11-10 MED ORDER — ZOLEDRONIC ACID 5 MG/100ML IV SOLN
INTRAVENOUS | Status: AC
  Administered 2022-11-10: 5 mg via INTRAVENOUS
  Filled 2022-11-10: qty 100

## 2022-11-10 MED ORDER — ZOLEDRONIC ACID 5 MG/100ML IV SOLN: 5.0000 mg | Freq: Once | INTRAVENOUS | Status: AC

## 2022-11-30 DIAGNOSIS — R058 Other specified cough: Secondary | ICD-10-CM | POA: Diagnosis not present

## 2022-11-30 DIAGNOSIS — J40 Bronchitis, not specified as acute or chronic: Secondary | ICD-10-CM | POA: Diagnosis not present

## 2022-12-06 DIAGNOSIS — Z9181 History of falling: Secondary | ICD-10-CM | POA: Diagnosis not present

## 2022-12-06 DIAGNOSIS — E663 Overweight: Secondary | ICD-10-CM | POA: Diagnosis not present

## 2022-12-06 DIAGNOSIS — G8929 Other chronic pain: Secondary | ICD-10-CM | POA: Diagnosis not present

## 2022-12-06 DIAGNOSIS — Z79899 Other long term (current) drug therapy: Secondary | ICD-10-CM | POA: Diagnosis not present

## 2022-12-06 DIAGNOSIS — G894 Chronic pain syndrome: Secondary | ICD-10-CM | POA: Diagnosis not present

## 2022-12-06 DIAGNOSIS — M179 Osteoarthritis of knee, unspecified: Secondary | ICD-10-CM | POA: Diagnosis not present

## 2022-12-06 DIAGNOSIS — M545 Low back pain, unspecified: Secondary | ICD-10-CM | POA: Diagnosis not present

## 2023-01-04 DIAGNOSIS — G8929 Other chronic pain: Secondary | ICD-10-CM | POA: Diagnosis not present

## 2023-01-04 DIAGNOSIS — M179 Osteoarthritis of knee, unspecified: Secondary | ICD-10-CM | POA: Diagnosis not present

## 2023-01-04 DIAGNOSIS — Z79899 Other long term (current) drug therapy: Secondary | ICD-10-CM | POA: Diagnosis not present

## 2023-01-04 DIAGNOSIS — Z9181 History of falling: Secondary | ICD-10-CM | POA: Diagnosis not present

## 2023-01-04 DIAGNOSIS — E663 Overweight: Secondary | ICD-10-CM | POA: Diagnosis not present

## 2023-01-04 DIAGNOSIS — M545 Low back pain, unspecified: Secondary | ICD-10-CM | POA: Diagnosis not present

## 2023-01-04 DIAGNOSIS — G894 Chronic pain syndrome: Secondary | ICD-10-CM | POA: Diagnosis not present

## 2023-01-25 DIAGNOSIS — H18593 Other hereditary corneal dystrophies, bilateral: Secondary | ICD-10-CM | POA: Diagnosis not present

## 2023-01-25 DIAGNOSIS — H16141 Punctate keratitis, right eye: Secondary | ICD-10-CM | POA: Diagnosis not present

## 2023-01-25 DIAGNOSIS — Z961 Presence of intraocular lens: Secondary | ICD-10-CM | POA: Diagnosis not present

## 2023-01-25 DIAGNOSIS — J449 Chronic obstructive pulmonary disease, unspecified: Secondary | ICD-10-CM | POA: Diagnosis not present

## 2023-01-25 DIAGNOSIS — H40053 Ocular hypertension, bilateral: Secondary | ICD-10-CM | POA: Diagnosis not present

## 2023-02-09 DIAGNOSIS — E663 Overweight: Secondary | ICD-10-CM | POA: Diagnosis not present

## 2023-02-09 DIAGNOSIS — M179 Osteoarthritis of knee, unspecified: Secondary | ICD-10-CM | POA: Diagnosis not present

## 2023-02-09 DIAGNOSIS — Z9181 History of falling: Secondary | ICD-10-CM | POA: Diagnosis not present

## 2023-02-09 DIAGNOSIS — M545 Low back pain, unspecified: Secondary | ICD-10-CM | POA: Diagnosis not present

## 2023-02-09 DIAGNOSIS — G8929 Other chronic pain: Secondary | ICD-10-CM | POA: Diagnosis not present

## 2023-02-09 DIAGNOSIS — Z79899 Other long term (current) drug therapy: Secondary | ICD-10-CM | POA: Diagnosis not present

## 2023-02-09 DIAGNOSIS — G894 Chronic pain syndrome: Secondary | ICD-10-CM | POA: Diagnosis not present

## 2023-02-09 DIAGNOSIS — I10 Essential (primary) hypertension: Secondary | ICD-10-CM | POA: Diagnosis not present

## 2023-03-12 DIAGNOSIS — M1712 Unilateral primary osteoarthritis, left knee: Secondary | ICD-10-CM | POA: Diagnosis not present

## 2023-03-15 DIAGNOSIS — Z9181 History of falling: Secondary | ICD-10-CM | POA: Diagnosis not present

## 2023-03-15 DIAGNOSIS — M179 Osteoarthritis of knee, unspecified: Secondary | ICD-10-CM | POA: Diagnosis not present

## 2023-03-15 DIAGNOSIS — M47816 Spondylosis without myelopathy or radiculopathy, lumbar region: Secondary | ICD-10-CM | POA: Diagnosis not present

## 2023-03-15 DIAGNOSIS — I739 Peripheral vascular disease, unspecified: Secondary | ICD-10-CM | POA: Diagnosis not present

## 2023-03-15 DIAGNOSIS — M810 Age-related osteoporosis without current pathological fracture: Secondary | ICD-10-CM | POA: Diagnosis not present

## 2023-03-15 DIAGNOSIS — D692 Other nonthrombocytopenic purpura: Secondary | ICD-10-CM | POA: Diagnosis not present

## 2023-03-15 DIAGNOSIS — M545 Low back pain, unspecified: Secondary | ICD-10-CM | POA: Diagnosis not present

## 2023-03-15 DIAGNOSIS — E7800 Pure hypercholesterolemia, unspecified: Secondary | ICD-10-CM | POA: Diagnosis not present

## 2023-03-15 DIAGNOSIS — N1831 Chronic kidney disease, stage 3a: Secondary | ICD-10-CM | POA: Diagnosis not present

## 2023-03-15 DIAGNOSIS — D631 Anemia in chronic kidney disease: Secondary | ICD-10-CM | POA: Diagnosis not present

## 2023-03-15 DIAGNOSIS — E663 Overweight: Secondary | ICD-10-CM | POA: Diagnosis not present

## 2023-03-15 DIAGNOSIS — G8929 Other chronic pain: Secondary | ICD-10-CM | POA: Diagnosis not present

## 2023-03-15 DIAGNOSIS — I129 Hypertensive chronic kidney disease with stage 1 through stage 4 chronic kidney disease, or unspecified chronic kidney disease: Secondary | ICD-10-CM | POA: Diagnosis not present

## 2023-03-15 DIAGNOSIS — G479 Sleep disorder, unspecified: Secondary | ICD-10-CM | POA: Diagnosis not present

## 2023-03-15 DIAGNOSIS — G894 Chronic pain syndrome: Secondary | ICD-10-CM | POA: Diagnosis not present

## 2023-03-15 DIAGNOSIS — G309 Alzheimer's disease, unspecified: Secondary | ICD-10-CM | POA: Diagnosis not present

## 2023-03-15 DIAGNOSIS — Z79899 Other long term (current) drug therapy: Secondary | ICD-10-CM | POA: Diagnosis not present

## 2023-03-15 DIAGNOSIS — I10 Essential (primary) hypertension: Secondary | ICD-10-CM | POA: Diagnosis not present

## 2023-03-29 DIAGNOSIS — M5136 Other intervertebral disc degeneration, lumbar region: Secondary | ICD-10-CM | POA: Diagnosis not present

## 2023-03-29 DIAGNOSIS — M1712 Unilateral primary osteoarthritis, left knee: Secondary | ICD-10-CM | POA: Diagnosis not present

## 2023-03-29 DIAGNOSIS — F4321 Adjustment disorder with depressed mood: Secondary | ICD-10-CM | POA: Diagnosis not present

## 2023-03-29 DIAGNOSIS — F0283 Dementia in other diseases classified elsewhere, unspecified severity, with mood disturbance: Secondary | ICD-10-CM | POA: Diagnosis not present

## 2023-03-29 DIAGNOSIS — G894 Chronic pain syndrome: Secondary | ICD-10-CM | POA: Diagnosis not present

## 2023-03-29 DIAGNOSIS — G309 Alzheimer's disease, unspecified: Secondary | ICD-10-CM | POA: Diagnosis not present

## 2023-03-29 DIAGNOSIS — F32A Depression, unspecified: Secondary | ICD-10-CM | POA: Diagnosis not present

## 2023-03-29 DIAGNOSIS — M47816 Spondylosis without myelopathy or radiculopathy, lumbar region: Secondary | ICD-10-CM | POA: Diagnosis not present

## 2023-03-29 DIAGNOSIS — I10 Essential (primary) hypertension: Secondary | ICD-10-CM | POA: Diagnosis not present

## 2023-04-05 DIAGNOSIS — F32A Depression, unspecified: Secondary | ICD-10-CM | POA: Diagnosis not present

## 2023-04-05 DIAGNOSIS — F0283 Dementia in other diseases classified elsewhere, unspecified severity, with mood disturbance: Secondary | ICD-10-CM | POA: Diagnosis not present

## 2023-04-05 DIAGNOSIS — G894 Chronic pain syndrome: Secondary | ICD-10-CM | POA: Diagnosis not present

## 2023-04-05 DIAGNOSIS — G309 Alzheimer's disease, unspecified: Secondary | ICD-10-CM | POA: Diagnosis not present

## 2023-04-05 DIAGNOSIS — M47816 Spondylosis without myelopathy or radiculopathy, lumbar region: Secondary | ICD-10-CM | POA: Diagnosis not present

## 2023-04-05 DIAGNOSIS — M5136 Other intervertebral disc degeneration, lumbar region: Secondary | ICD-10-CM | POA: Diagnosis not present

## 2023-04-05 DIAGNOSIS — M1712 Unilateral primary osteoarthritis, left knee: Secondary | ICD-10-CM | POA: Diagnosis not present

## 2023-04-05 DIAGNOSIS — I10 Essential (primary) hypertension: Secondary | ICD-10-CM | POA: Diagnosis not present

## 2023-04-05 DIAGNOSIS — F4321 Adjustment disorder with depressed mood: Secondary | ICD-10-CM | POA: Diagnosis not present

## 2023-04-12 DIAGNOSIS — G894 Chronic pain syndrome: Secondary | ICD-10-CM | POA: Diagnosis not present

## 2023-04-12 DIAGNOSIS — G309 Alzheimer's disease, unspecified: Secondary | ICD-10-CM | POA: Diagnosis not present

## 2023-04-12 DIAGNOSIS — G8929 Other chronic pain: Secondary | ICD-10-CM | POA: Diagnosis not present

## 2023-04-12 DIAGNOSIS — Z79899 Other long term (current) drug therapy: Secondary | ICD-10-CM | POA: Diagnosis not present

## 2023-04-12 DIAGNOSIS — M47816 Spondylosis without myelopathy or radiculopathy, lumbar region: Secondary | ICD-10-CM | POA: Diagnosis not present

## 2023-04-12 DIAGNOSIS — F32A Depression, unspecified: Secondary | ICD-10-CM | POA: Diagnosis not present

## 2023-04-12 DIAGNOSIS — Z9181 History of falling: Secondary | ICD-10-CM | POA: Diagnosis not present

## 2023-04-12 DIAGNOSIS — M545 Low back pain, unspecified: Secondary | ICD-10-CM | POA: Diagnosis not present

## 2023-04-12 DIAGNOSIS — I10 Essential (primary) hypertension: Secondary | ICD-10-CM | POA: Diagnosis not present

## 2023-04-12 DIAGNOSIS — M179 Osteoarthritis of knee, unspecified: Secondary | ICD-10-CM | POA: Diagnosis not present

## 2023-04-12 DIAGNOSIS — E559 Vitamin D deficiency, unspecified: Secondary | ICD-10-CM | POA: Diagnosis not present

## 2023-04-12 DIAGNOSIS — F0283 Dementia in other diseases classified elsewhere, unspecified severity, with mood disturbance: Secondary | ICD-10-CM | POA: Diagnosis not present

## 2023-04-12 DIAGNOSIS — F4321 Adjustment disorder with depressed mood: Secondary | ICD-10-CM | POA: Diagnosis not present

## 2023-04-12 DIAGNOSIS — M5136 Other intervertebral disc degeneration, lumbar region: Secondary | ICD-10-CM | POA: Diagnosis not present

## 2023-04-12 DIAGNOSIS — M1712 Unilateral primary osteoarthritis, left knee: Secondary | ICD-10-CM | POA: Diagnosis not present

## 2023-04-12 DIAGNOSIS — E663 Overweight: Secondary | ICD-10-CM | POA: Diagnosis not present

## 2023-04-18 ENCOUNTER — Encounter (HOSPITAL_COMMUNITY): Payer: Self-pay | Admitting: *Deleted

## 2023-04-18 ENCOUNTER — Emergency Department (HOSPITAL_COMMUNITY)
Admission: EM | Admit: 2023-04-18 | Discharge: 2023-04-18 | Disposition: A | Discharge status: Home / Self Care | Payer: Medicare HMO | Attending: Emergency Medicine | Admitting: Emergency Medicine

## 2023-04-18 ENCOUNTER — Emergency Department (HOSPITAL_COMMUNITY): Payer: Medicare HMO

## 2023-04-18 ENCOUNTER — Other Ambulatory Visit: Payer: Self-pay

## 2023-04-18 DIAGNOSIS — M5459 Other low back pain: Secondary | ICD-10-CM | POA: Diagnosis not present

## 2023-04-18 DIAGNOSIS — I10 Essential (primary) hypertension: Secondary | ICD-10-CM | POA: Insufficient documentation

## 2023-04-18 DIAGNOSIS — Z79899 Other long term (current) drug therapy: Secondary | ICD-10-CM | POA: Insufficient documentation

## 2023-04-18 DIAGNOSIS — M545 Low back pain, unspecified: Secondary | ICD-10-CM | POA: Diagnosis not present

## 2023-04-18 DIAGNOSIS — M546 Pain in thoracic spine: Secondary | ICD-10-CM | POA: Diagnosis not present

## 2023-04-18 DIAGNOSIS — N390 Urinary tract infection, site not specified: Secondary | ICD-10-CM | POA: Diagnosis not present

## 2023-04-18 LAB — COMPREHENSIVE METABOLIC PANEL
ALT: 13 U/L (ref 0–44)
AST: 22 U/L (ref 15–41)
Albumin: 4.1 g/dL (ref 3.5–5.0)
Alkaline Phosphatase: 60 U/L (ref 38–126)
Anion gap: 13 (ref 5–15)
BUN: 12 mg/dL (ref 8–23)
CO2: 23 mmol/L (ref 22–32)
Calcium: 9.8 mg/dL (ref 8.9–10.3)
Chloride: 101 mmol/L (ref 98–111)
Creatinine, Ser: 0.93 mg/dL (ref 0.44–1.00)
GFR, Estimated: 59 mL/min — ABNORMAL LOW (ref >60)
Glucose, Bld: 110 mg/dL — ABNORMAL HIGH (ref 70–99)
Potassium: 3.7 mmol/L (ref 3.5–5.1)
Sodium: 137 mmol/L (ref 135–145)
Total Bilirubin: 0.7 mg/dL (ref 0.3–1.2)
Total Protein: 7.4 g/dL (ref 6.5–8.1)

## 2023-04-18 LAB — CBC WITH DIFFERENTIAL/PLATELET
Abs Immature Granulocytes: 0.02 10*3/uL (ref 0.00–0.07)
Basophils Absolute: 0.1 10*3/uL (ref 0.0–0.1)
Basophils Relative: 1 %
Eosinophils Absolute: 0.5 10*3/uL (ref 0.0–0.5)
Eosinophils Relative: 6 %
HCT: 40.2 % (ref 36.0–46.0)
Hemoglobin: 13.2 g/dL (ref 12.0–15.0)
Immature Granulocytes: 0 %
Lymphocytes Relative: 24 %
Lymphs Abs: 2.3 10*3/uL (ref 0.7–4.0)
MCH: 29.6 pg (ref 26.0–34.0)
MCHC: 32.8 g/dL (ref 30.0–36.0)
MCV: 90.1 fL (ref 80.0–100.0)
Monocytes Absolute: 1 10*3/uL (ref 0.1–1.0)
Monocytes Relative: 11 %
Neutro Abs: 5.5 10*3/uL (ref 1.7–7.7)
Neutrophils Relative %: 58 %
Platelets: 208 10*3/uL (ref 150–400)
RBC: 4.46 MIL/uL (ref 3.87–5.11)
RDW: 13.1 % (ref 11.5–15.5)
WBC: 9.3 10*3/uL (ref 4.0–10.5)
nRBC: 0 % (ref 0.0–0.2)

## 2023-04-18 LAB — URINALYSIS, ROUTINE W REFLEX MICROSCOPIC
Bilirubin Urine: NEGATIVE
Glucose, UA: NEGATIVE mg/dL
Ketones, ur: NEGATIVE mg/dL
Nitrite: POSITIVE — AB
Protein, ur: 30 mg/dL — AB
Specific Gravity, Urine: 1.008 (ref 1.005–1.030)
WBC, UA: >50 WBC/hpf (ref 0–5)
pH: 6 (ref 5.0–8.0)

## 2023-04-18 MED ORDER — CEPHALEXIN 250 MG PO CAPS
500.0000 mg | ORAL_CAPSULE | Freq: Once | ORAL | Status: AC
  Administered 2023-04-18: 500 mg via ORAL
  Filled 2023-04-18: qty 2

## 2023-04-18 MED ORDER — LIDOCAINE 5 % EX PTCH
1.0000 | MEDICATED_PATCH | CUTANEOUS | Status: DC
  Administered 2023-04-18: 1 via TRANSDERMAL
  Filled 2023-04-18: qty 1

## 2023-04-18 MED ORDER — CEPHALEXIN 500 MG PO CAPS: 500.0000 mg | ORAL_CAPSULE | Freq: Four times a day (QID) | ORAL | 0 refills | Status: DC

## 2023-04-18 MED ORDER — OXYCODONE-ACETAMINOPHEN 5-325 MG PO TABS
1.0000 | ORAL_TABLET | Freq: Once | ORAL | Status: AC
  Administered 2023-04-18: 1 via ORAL
  Filled 2023-04-18: qty 1

## 2023-04-18 NOTE — Discharge Instructions (Signed)
The x-rays today were normal, blood work was normal but urine did look infected.  You received your first dose of medicine tonight and you do not need to take the next dose till tomorrow.  Continue to take your pain medication.  If you start having confusion, repetitive vomiting, fever return to the emergency room

## 2023-04-18 NOTE — ED Provider Notes (Signed)
[place] EMERGENCY DEPARTMENT AT [facility] Provider Note   CSN: [id number] Arrival date & time: 04/18/23  1744     History  Chief Complaint  Patient presents with   Back Pain    Cynthia Guzman is a 89 y.o. female.  Patient is an 89 year old female with a history of hypertension, chronic back pain status post multiple surgeries on oxycodone twice daily and baclofen as needed who lives at home with her daughter and is presenting today due to worsening back pain and muscle spasm.  Patient reports the pain seemed to get more intense last night.  She denies any recent falls or changes in her medication.  However because it was so uncomfortable last night she was standing rubbing her back in the doorway and reports she actually scraped her back some after doing that.  She has an achy pain all the time but then the pain comes in waves that she thinks is related to a muscle spasm.  Patient's daughter is present with her in the room and reports that she has had pains like this intermittently for years now and they did oxycodone and baclofen earlier today but she was worried because at times she has had UTIs which has been related to the pain as well.  Patient denies any abdominal pain, shortness of breath, nausea but did report 1 episode of emesis today.  She has not had fever.  Her daughter denies any altered mental status or any of the above.  She has been acting her normal self over the last few days.  The history is provided by the patient, a caregiver and medical records.  Back Pain      Home Medications Prior to Admission medications   Medication Sig Start Date End Date Taking? Authorizing Provider  cephALEXin (KEFLEX) 500 MG capsule Take 1 capsule (500 mg total) by mouth 4 (four) times daily. 04/18/23  Yes Aerie Donica, Alphonzo Lemmings, MD  amLODipine (NORVASC) 5 MG tablet Take 1 tablet (5 mg total) by mouth at bedtime. 10/27/18   Ghimire, Werner Lean, MD  aspirin EC 81 MG tablet Take  81 mg by mouth daily.    [provider]  buprenorphine (BUTRANS) 20 MCG/HR PTWK Place 1 patch onto the skin once a week.  08/27/19   [provider]  Cholecalciferol (VITAMIN D) 2000 units CAPS Take 2,000 Units by mouth daily.    [provider]  dorzolamide-timolol (COSOPT) 22.3-6.8 MG/ML ophthalmic solution Place 1 drop into both eyes 2 (two) times daily.    [provider]  latanoprost (XALATAN) 0.005 % ophthalmic solution Place 1 drop into both eyes at bedtime.    [provider]  metoprolol (LOPRESSOR) 50 MG tablet Take 50 mg by mouth daily.     [provider]  Probiotic Product (ALIGN PO) Take by mouth.    [provider]  promethazine (PHENERGAN) 25 MG tablet Take 25 mg by mouth 4 (four) times daily.    [provider]  sertraline (ZOLOFT) 100 MG tablet Take 150 mg by mouth at bedtime.     [provider]  simvastatin (ZOCOR) 20 MG tablet Take 20 mg by mouth at bedtime.     [provider]  telmisartan (MICARDIS) 40 MG tablet Take 40 mg by mouth daily. 08/03/19   [provider]      Allergies    Arthritis pain regimen [aspirin]    Review of Systems   Review of Systems  Musculoskeletal:  Positive  for back pain.    Physical Exam Updated Vital Signs BP (!) 194/85 (BP Location: Left Arm)   Pulse 73   Temp 98.1 F (36.7 C) (Oral)   Resp 19   Ht  (1.6 m)   Wt 76.2 kg   SpO2 97%   BMI 29.76 kg/m  Physical Exam Vitals and nursing note reviewed.  Constitutional:      General: She is not in acute distress.    Appearance: She is well-developed.  HENT:     Head: Normocephalic and atraumatic.  Eyes:     Pupils: Pupils are equal, round, and reactive to light.  Cardiovascular:     Rate and Rhythm: Normal rate and regular rhythm.     Heart sounds: Normal heart sounds. No murmur heard.    No friction rub.  Pulmonary:     Effort: Pulmonary effort is normal.     Breath sounds:  Normal breath sounds. No wheezing or rales.  Abdominal:     General: Bowel sounds are normal. There is no distension.     Palpations: Abdomen is soft.     Tenderness: There is no abdominal tenderness. There is no right CVA tenderness, left CVA tenderness, guarding or rebound.  Musculoskeletal:        General: Tenderness present. Normal range of motion.       Back:     Comments: No edema  Skin:    General: Skin is warm and dry.     Findings: No rash.  Neurological:     Mental Status: She is alert and oriented to person, place, and time.     Cranial Nerves: No cranial nerve deficit.  Psychiatric:        Behavior: Behavior normal.     ED Results / Procedures / Treatments   Labs (all labs ordered are listed, but only abnormal results are displayed) Labs Reviewed  COMPREHENSIVE METABOLIC PANEL - Abnormal; Notable for the following components:      Result Value   Glucose, Bld 110 (*)    GFR, Estimated 59 (*)    All other components within normal limits  URINALYSIS, ROUTINE W REFLEX MICROSCOPIC - Abnormal; Notable for the following components:   APPearance HAZY (*)    Hgb urine dipstick SMALL (*)    Protein, ur 30 (*)    Nitrite POSITIVE (*)    Leukocytes,Ua MODERATE (*)    Bacteria, UA MANY (*)    All other components within normal limits  URINE CULTURE  CBC WITH DIFFERENTIAL/PLATELET    EKG None  Radiology DG Thoracic Spine 2 View  Result Date: 04/18/2023 CLINICAL DATA:  Pain, back spasms EXAM: THORACIC SPINE 2 VIEWS COMPARISON:  None Available. FINDINGS: Diffuse degenerative disc disease. Normal alignment. No fracture or focal bone lesion. IMPRESSION: No acute bony abnormality. Electronically Signed   By: Charlett Nose M.D.   On: 04/18/2023 22:19    Procedures Procedures    Medications Ordered in ED Medications  lidocaine (LIDODERM) 5 % 1 patch (1 patch Transdermal Patch Applied 04/18/23 2154)  oxyCODONE-acetaminophen (PERCOCET/ROXICET) 5-325 MG per tablet 1 tablet  (1 tablet Oral Given 04/18/23 2153)    ED Course/ Medical Decision Making/ A&P                             Medical Decision Making Amount and/or Complexity of Data Reviewed Labs: ordered. Decision-making details documented in ED Course. Radiology: ordered and independent interpretation performed. Decision-making  details documented in ED Course.  Risk Prescription drug management.   Pt with multiple medical problems and comorbidities and presenting today with a complaint that caries a high risk for morbidity and mortality.  Here today with back pain.  Patient has no abdominal pain and is not having sepsis symptoms such as fever, altered mental status.  She is not having new neurologic complaints such as weakness or numbness.  She has still been able to ambulate.  There has been no trauma however patient does have curvature of the spine and concern for some brittle bones.  She was rubbing her back on the wall and has some mild abrasions from that but has no flank pain and low concern at this time for renal stones, AAA, shingles.  Concern for thoracic compression fracture versus exacerbation of her chronic pain.  Also daughter reports that she has had this before in the setting of UTIs and her urine had smelled strongly in the last few days.  Looking back over the patient's chart she has always had negative cultures and urine has appeared normal on testing here but last test was 4 years ago.  I independently interpreted patient's labs.  CBC was normal today, CMP was normal, UA does have positive nitrites, leukocytes, greater than 50 white cells and many bacteria today which would be concerning for infection.  A culture was sent.  Also plain imaging done to ensure no evidence of compression fracture.  Patient had not had any of her home pain medication since this morning and she was given a dose of oxycodone.  10:44 PM I have independently visualized and interpreted pt's images today.  Thoracic spine  without compression fractures.  Findings discussed with the patient and her daughter.  At this time she appears stable for discharge home.  She was given return precautions.  Started on antibiotics and given a prescription.  No indication for admission at this time.  Patient and her daughter are comfortable with this plan.          Final Clinical Impression(s) / ED Diagnoses Final diagnoses:  Lower urinary tract infectious disease  Acute midline low back pain without sciatica    Rx / DC Orders ED Discharge Orders          Ordered    cephALEXin (KEFLEX) 500 MG capsule  4 times daily        04/18/23 2244              Gwyneth Sprout, MD 04/18/23 2245

## 2023-04-18 NOTE — ED Provider Triage Note (Signed)
Emergency Medicine Provider Triage Evaluation Note  TANINA BARB , a 89 y.o. female  was evaluated in triage.  Pt complains of low back pain which she believes is a muscle spasm.  States this feels similar to the ones she had previously.  She does state that she noticed some dysuria starting today.  She took oxycodone and baclofen as she typically does when she gets this pain however today she did not get any relief..  Review of Systems  Positive: As above Negative: As above  Physical Exam  There were no vitals taken for this visit. Gen:   Awake, no distress   Resp:  Normal effort  MSK:   Moves extremities without difficulty  Other:    Medical Decision Making  Medically screening exam initiated at 5:52 PM.  Appropriate orders placed.  LAFAYE MCELMURRY was informed that the remainder of the evaluation will be completed by another provider, this initial triage assessment does not replace that evaluation, and the importance of remaining in the ED until their evaluation is complete.     Marita Kansas, PA-C 04/18/23 1754

## 2023-04-18 NOTE — ED Triage Notes (Signed)
The pt is c/o back spasms since last  night  very hard of hearing

## 2023-04-20 DIAGNOSIS — N390 Urinary tract infection, site not specified: Secondary | ICD-10-CM | POA: Diagnosis not present

## 2023-04-20 DIAGNOSIS — M549 Dorsalgia, unspecified: Secondary | ICD-10-CM | POA: Diagnosis not present

## 2023-04-20 DIAGNOSIS — I129 Hypertensive chronic kidney disease with stage 1 through stage 4 chronic kidney disease, or unspecified chronic kidney disease: Secondary | ICD-10-CM | POA: Diagnosis not present

## 2023-04-20 LAB — URINE CULTURE: Culture: >=100000 — AB

## 2023-04-21 ENCOUNTER — Telehealth (HOSPITAL_BASED_OUTPATIENT_CLINIC_OR_DEPARTMENT_OTHER): Payer: Self-pay | Admitting: *Deleted

## 2023-04-21 NOTE — Telephone Encounter (Signed)
Post ED Visit - Positive Culture Follow-up  Culture report reviewed by antimicrobial stewardship pharmacist: [facility] []  Enzo Bi, Pharm.D. []  Celedonio Miyamoto, [address].D., BCPS AQ-ID []  Garvin Fila, Pharm.D., BCPS []  Georgina Pillion, Pharm.D., BCPS []  [place], [place].D., BCPS, AAHIVP []  Estella Husk, Pharm.D., BCPS, AAHIVP []  Lysle Pearl, PharmD, BCPS []  Phillips Climes, PharmD, BCPS []  Agapito Games, PharmD, BCPS []  Verlan Friends, PharmD []  Mervyn Gay, PharmD, BCPS [x]  Riccardo Dubin, PharmD  [facility] []  Len Childs, PharmD []  Greer Pickerel, PharmD []  Adalberto Cole, PharmD []  Perlie Gold, Rph []  Lonell Face) Jean Rosenthal, PharmD []  Earl Many, PharmD []  Junita Push, PharmD []  Dorna Leitz, PharmD []  Terrilee Files, PharmD []  Lynann Beaver, PharmD []  Keturah Barre, PharmD []  Loralee Pacas, PharmD []  Bernadene Person, PharmD   Positive urine culture Treated with Cephalexin, organism sensitive to the same and no further patient follow-up is required at this time.  Cynthia Guzman 04/21/2023, 12:34 PM

## 2023-04-24 NOTE — Telephone Encounter (Signed)
Transition Care Management Unsuccessful Follow-up Telephone Call  Date of discharge and from where:  04/24/2023 [facility].  Attempts:  1st Attempt  Reason for unsuccessful TCM follow-up call:  Left voice message  Ridgely Anastacio [facility]  [facility] Population Health Community Resource Care Guide   [email or web address]  ?? [phone]   Website: [email or web address]  [email or web address]

## 2023-05-03 DIAGNOSIS — M1712 Unilateral primary osteoarthritis, left knee: Secondary | ICD-10-CM | POA: Diagnosis not present

## 2023-05-03 DIAGNOSIS — G309 Alzheimer's disease, unspecified: Secondary | ICD-10-CM | POA: Diagnosis not present

## 2023-05-03 DIAGNOSIS — M47816 Spondylosis without myelopathy or radiculopathy, lumbar region: Secondary | ICD-10-CM | POA: Diagnosis not present

## 2023-05-03 DIAGNOSIS — M5136 Other intervertebral disc degeneration, lumbar region: Secondary | ICD-10-CM | POA: Diagnosis not present

## 2023-05-03 DIAGNOSIS — F0283 Dementia in other diseases classified elsewhere, unspecified severity, with mood disturbance: Secondary | ICD-10-CM | POA: Diagnosis not present

## 2023-05-03 DIAGNOSIS — F32A Depression, unspecified: Secondary | ICD-10-CM | POA: Diagnosis not present

## 2023-05-03 DIAGNOSIS — I10 Essential (primary) hypertension: Secondary | ICD-10-CM | POA: Diagnosis not present

## 2023-05-03 DIAGNOSIS — F4321 Adjustment disorder with depressed mood: Secondary | ICD-10-CM | POA: Diagnosis not present

## 2023-05-03 DIAGNOSIS — G894 Chronic pain syndrome: Secondary | ICD-10-CM | POA: Diagnosis not present

## 2023-05-10 DIAGNOSIS — Z9181 History of falling: Secondary | ICD-10-CM | POA: Diagnosis not present

## 2023-05-10 DIAGNOSIS — M545 Low back pain, unspecified: Secondary | ICD-10-CM | POA: Diagnosis not present

## 2023-05-10 DIAGNOSIS — M179 Osteoarthritis of knee, unspecified: Secondary | ICD-10-CM | POA: Diagnosis not present

## 2023-05-10 DIAGNOSIS — Z79899 Other long term (current) drug therapy: Secondary | ICD-10-CM | POA: Diagnosis not present

## 2023-05-10 DIAGNOSIS — E663 Overweight: Secondary | ICD-10-CM | POA: Diagnosis not present

## 2023-05-10 DIAGNOSIS — G8929 Other chronic pain: Secondary | ICD-10-CM | POA: Diagnosis not present

## 2023-05-10 DIAGNOSIS — M1711 Unilateral primary osteoarthritis, right knee: Secondary | ICD-10-CM | POA: Diagnosis not present

## 2023-05-10 DIAGNOSIS — I10 Essential (primary) hypertension: Secondary | ICD-10-CM | POA: Diagnosis not present

## 2023-05-17 DIAGNOSIS — M1711 Unilateral primary osteoarthritis, right knee: Secondary | ICD-10-CM | POA: Diagnosis not present

## 2023-05-18 DIAGNOSIS — I10 Essential (primary) hypertension: Secondary | ICD-10-CM | POA: Diagnosis not present

## 2023-05-18 DIAGNOSIS — M1712 Unilateral primary osteoarthritis, left knee: Secondary | ICD-10-CM | POA: Diagnosis not present

## 2023-05-18 DIAGNOSIS — F4321 Adjustment disorder with depressed mood: Secondary | ICD-10-CM | POA: Diagnosis not present

## 2023-05-18 DIAGNOSIS — F32A Depression, unspecified: Secondary | ICD-10-CM | POA: Diagnosis not present

## 2023-05-18 DIAGNOSIS — M47816 Spondylosis without myelopathy or radiculopathy, lumbar region: Secondary | ICD-10-CM | POA: Diagnosis not present

## 2023-05-18 DIAGNOSIS — M5136 Other intervertebral disc degeneration, lumbar region: Secondary | ICD-10-CM | POA: Diagnosis not present

## 2023-05-18 DIAGNOSIS — G309 Alzheimer's disease, unspecified: Secondary | ICD-10-CM | POA: Diagnosis not present

## 2023-05-18 DIAGNOSIS — F0283 Dementia in other diseases classified elsewhere, unspecified severity, with mood disturbance: Secondary | ICD-10-CM | POA: Diagnosis not present

## 2023-05-18 DIAGNOSIS — G894 Chronic pain syndrome: Secondary | ICD-10-CM | POA: Diagnosis not present

## 2023-06-07 DIAGNOSIS — M545 Low back pain, unspecified: Secondary | ICD-10-CM | POA: Diagnosis not present

## 2023-06-07 DIAGNOSIS — G894 Chronic pain syndrome: Secondary | ICD-10-CM | POA: Diagnosis not present

## 2023-06-07 DIAGNOSIS — Z79899 Other long term (current) drug therapy: Secondary | ICD-10-CM | POA: Diagnosis not present

## 2023-06-07 DIAGNOSIS — I10 Essential (primary) hypertension: Secondary | ICD-10-CM | POA: Diagnosis not present

## 2023-06-07 DIAGNOSIS — M179 Osteoarthritis of knee, unspecified: Secondary | ICD-10-CM | POA: Diagnosis not present

## 2023-06-07 DIAGNOSIS — Z9181 History of falling: Secondary | ICD-10-CM | POA: Diagnosis not present

## 2023-06-07 DIAGNOSIS — E663 Overweight: Secondary | ICD-10-CM | POA: Diagnosis not present

## 2023-07-04 DIAGNOSIS — M1712 Unilateral primary osteoarthritis, left knee: Secondary | ICD-10-CM | POA: Diagnosis not present

## 2023-07-05 DIAGNOSIS — Z9181 History of falling: Secondary | ICD-10-CM | POA: Diagnosis not present

## 2023-07-05 DIAGNOSIS — G8929 Other chronic pain: Secondary | ICD-10-CM | POA: Diagnosis not present

## 2023-07-05 DIAGNOSIS — E663 Overweight: Secondary | ICD-10-CM | POA: Diagnosis not present

## 2023-07-05 DIAGNOSIS — I10 Essential (primary) hypertension: Secondary | ICD-10-CM | POA: Diagnosis not present

## 2023-07-05 DIAGNOSIS — M179 Osteoarthritis of knee, unspecified: Secondary | ICD-10-CM | POA: Diagnosis not present

## 2023-07-05 DIAGNOSIS — Z79899 Other long term (current) drug therapy: Secondary | ICD-10-CM | POA: Diagnosis not present

## 2023-07-05 DIAGNOSIS — M545 Low back pain, unspecified: Secondary | ICD-10-CM | POA: Diagnosis not present

## 2023-08-08 DIAGNOSIS — G894 Chronic pain syndrome: Secondary | ICD-10-CM | POA: Diagnosis not present

## 2023-08-08 DIAGNOSIS — Z9181 History of falling: Secondary | ICD-10-CM | POA: Diagnosis not present

## 2023-08-08 DIAGNOSIS — I10 Essential (primary) hypertension: Secondary | ICD-10-CM | POA: Diagnosis not present

## 2023-08-08 DIAGNOSIS — Z79899 Other long term (current) drug therapy: Secondary | ICD-10-CM | POA: Diagnosis not present

## 2023-08-08 DIAGNOSIS — M47816 Spondylosis without myelopathy or radiculopathy, lumbar region: Secondary | ICD-10-CM | POA: Diagnosis not present

## 2023-08-08 DIAGNOSIS — M179 Osteoarthritis of knee, unspecified: Secondary | ICD-10-CM | POA: Diagnosis not present

## 2023-08-08 DIAGNOSIS — M545 Low back pain, unspecified: Secondary | ICD-10-CM | POA: Diagnosis not present

## 2023-08-08 DIAGNOSIS — E663 Overweight: Secondary | ICD-10-CM | POA: Diagnosis not present

## 2023-08-08 DIAGNOSIS — G8929 Other chronic pain: Secondary | ICD-10-CM | POA: Diagnosis not present

## 2023-09-07 DIAGNOSIS — R0602 Shortness of breath: Secondary | ICD-10-CM | POA: Diagnosis not present

## 2023-09-07 DIAGNOSIS — E663 Overweight: Secondary | ICD-10-CM | POA: Diagnosis not present

## 2023-09-07 DIAGNOSIS — M179 Osteoarthritis of knee, unspecified: Secondary | ICD-10-CM | POA: Diagnosis not present

## 2023-09-07 DIAGNOSIS — M47816 Spondylosis without myelopathy or radiculopathy, lumbar region: Secondary | ICD-10-CM | POA: Diagnosis not present

## 2023-09-07 DIAGNOSIS — Z9181 History of falling: Secondary | ICD-10-CM | POA: Diagnosis not present

## 2023-09-07 DIAGNOSIS — M545 Low back pain, unspecified: Secondary | ICD-10-CM | POA: Diagnosis not present

## 2023-09-07 DIAGNOSIS — I10 Essential (primary) hypertension: Secondary | ICD-10-CM | POA: Diagnosis not present

## 2023-09-07 DIAGNOSIS — G894 Chronic pain syndrome: Secondary | ICD-10-CM | POA: Diagnosis not present

## 2023-09-14 DIAGNOSIS — N1831 Chronic kidney disease, stage 3a: Secondary | ICD-10-CM | POA: Diagnosis not present

## 2023-09-14 DIAGNOSIS — E785 Hyperlipidemia, unspecified: Secondary | ICD-10-CM | POA: Diagnosis not present

## 2023-09-14 DIAGNOSIS — E7800 Pure hypercholesterolemia, unspecified: Secondary | ICD-10-CM | POA: Diagnosis not present

## 2023-09-14 DIAGNOSIS — D649 Anemia, unspecified: Secondary | ICD-10-CM | POA: Diagnosis not present

## 2023-09-14 DIAGNOSIS — E559 Vitamin D deficiency, unspecified: Secondary | ICD-10-CM | POA: Diagnosis not present

## 2023-09-14 DIAGNOSIS — I129 Hypertensive chronic kidney disease with stage 1 through stage 4 chronic kidney disease, or unspecified chronic kidney disease: Secondary | ICD-10-CM | POA: Diagnosis not present

## 2023-09-20 DIAGNOSIS — M810 Age-related osteoporosis without current pathological fracture: Secondary | ICD-10-CM | POA: Diagnosis not present

## 2023-09-20 DIAGNOSIS — Z23 Encounter for immunization: Secondary | ICD-10-CM | POA: Diagnosis not present

## 2023-09-20 DIAGNOSIS — I739 Peripheral vascular disease, unspecified: Secondary | ICD-10-CM | POA: Diagnosis not present

## 2023-09-20 DIAGNOSIS — I129 Hypertensive chronic kidney disease with stage 1 through stage 4 chronic kidney disease, or unspecified chronic kidney disease: Secondary | ICD-10-CM | POA: Diagnosis not present

## 2023-09-20 DIAGNOSIS — G309 Alzheimer's disease, unspecified: Secondary | ICD-10-CM | POA: Diagnosis not present

## 2023-09-20 DIAGNOSIS — Z0000 Encounter for general adult medical examination without abnormal findings: Secondary | ICD-10-CM | POA: Diagnosis not present

## 2023-09-20 DIAGNOSIS — Z1339 Encounter for screening examination for other mental health and behavioral disorders: Secondary | ICD-10-CM | POA: Diagnosis not present

## 2023-09-20 DIAGNOSIS — Z1331 Encounter for screening for depression: Secondary | ICD-10-CM | POA: Diagnosis not present

## 2023-09-20 DIAGNOSIS — N3281 Overactive bladder: Secondary | ICD-10-CM | POA: Diagnosis not present

## 2023-09-20 DIAGNOSIS — N1831 Chronic kidney disease, stage 3a: Secondary | ICD-10-CM | POA: Diagnosis not present

## 2023-09-20 DIAGNOSIS — D631 Anemia in chronic kidney disease: Secondary | ICD-10-CM | POA: Diagnosis not present

## 2023-09-28 DIAGNOSIS — R82998 Other abnormal findings in urine: Secondary | ICD-10-CM | POA: Diagnosis not present

## 2023-10-04 DIAGNOSIS — E663 Overweight: Secondary | ICD-10-CM | POA: Diagnosis not present

## 2023-10-04 DIAGNOSIS — G894 Chronic pain syndrome: Secondary | ICD-10-CM | POA: Diagnosis not present

## 2023-10-04 DIAGNOSIS — M21062 Valgus deformity, not elsewhere classified, left knee: Secondary | ICD-10-CM | POA: Diagnosis not present

## 2023-10-04 DIAGNOSIS — Z9181 History of falling: Secondary | ICD-10-CM | POA: Diagnosis not present

## 2023-10-04 DIAGNOSIS — M179 Osteoarthritis of knee, unspecified: Secondary | ICD-10-CM | POA: Diagnosis not present

## 2023-10-04 DIAGNOSIS — M47816 Spondylosis without myelopathy or radiculopathy, lumbar region: Secondary | ICD-10-CM | POA: Diagnosis not present

## 2023-10-04 DIAGNOSIS — R0602 Shortness of breath: Secondary | ICD-10-CM | POA: Diagnosis not present

## 2023-10-04 DIAGNOSIS — M545 Low back pain, unspecified: Secondary | ICD-10-CM | POA: Diagnosis not present

## 2023-10-04 DIAGNOSIS — I10 Essential (primary) hypertension: Secondary | ICD-10-CM | POA: Diagnosis not present

## 2023-10-05 DIAGNOSIS — M1712 Unilateral primary osteoarthritis, left knee: Secondary | ICD-10-CM | POA: Diagnosis not present

## 2023-10-11 DIAGNOSIS — R0602 Shortness of breath: Secondary | ICD-10-CM | POA: Diagnosis not present

## 2023-10-11 DIAGNOSIS — M545 Low back pain, unspecified: Secondary | ICD-10-CM | POA: Diagnosis not present

## 2023-10-11 DIAGNOSIS — E663 Overweight: Secondary | ICD-10-CM | POA: Diagnosis not present

## 2023-10-11 DIAGNOSIS — I10 Essential (primary) hypertension: Secondary | ICD-10-CM | POA: Diagnosis not present

## 2023-10-11 DIAGNOSIS — M179 Osteoarthritis of knee, unspecified: Secondary | ICD-10-CM | POA: Diagnosis not present

## 2023-10-11 DIAGNOSIS — M21062 Valgus deformity, not elsewhere classified, left knee: Secondary | ICD-10-CM | POA: Diagnosis not present

## 2023-10-11 DIAGNOSIS — G894 Chronic pain syndrome: Secondary | ICD-10-CM | POA: Diagnosis not present

## 2023-10-11 DIAGNOSIS — M47816 Spondylosis without myelopathy or radiculopathy, lumbar region: Secondary | ICD-10-CM | POA: Diagnosis not present

## 2023-10-11 DIAGNOSIS — Z9181 History of falling: Secondary | ICD-10-CM | POA: Diagnosis not present

## 2023-10-30 DIAGNOSIS — E559 Vitamin D deficiency, unspecified: Secondary | ICD-10-CM | POA: Diagnosis not present

## 2023-10-30 DIAGNOSIS — M810 Age-related osteoporosis without current pathological fracture: Secondary | ICD-10-CM | POA: Diagnosis not present

## 2023-10-31 DIAGNOSIS — M47816 Spondylosis without myelopathy or radiculopathy, lumbar region: Secondary | ICD-10-CM | POA: Diagnosis not present

## 2023-10-31 DIAGNOSIS — E663 Overweight: Secondary | ICD-10-CM | POA: Diagnosis not present

## 2023-10-31 DIAGNOSIS — M21062 Valgus deformity, not elsewhere classified, left knee: Secondary | ICD-10-CM | POA: Diagnosis not present

## 2023-10-31 DIAGNOSIS — M179 Osteoarthritis of knee, unspecified: Secondary | ICD-10-CM | POA: Diagnosis not present

## 2023-10-31 DIAGNOSIS — M545 Low back pain, unspecified: Secondary | ICD-10-CM | POA: Diagnosis not present

## 2023-10-31 DIAGNOSIS — Z9181 History of falling: Secondary | ICD-10-CM | POA: Diagnosis not present

## 2023-10-31 DIAGNOSIS — G894 Chronic pain syndrome: Secondary | ICD-10-CM | POA: Diagnosis not present

## 2023-10-31 DIAGNOSIS — I10 Essential (primary) hypertension: Secondary | ICD-10-CM | POA: Diagnosis not present

## 2023-10-31 DIAGNOSIS — G8929 Other chronic pain: Secondary | ICD-10-CM | POA: Diagnosis not present

## 2023-11-08 ENCOUNTER — Other Ambulatory Visit (HOSPITAL_COMMUNITY): Payer: Self-pay

## 2023-11-12 ENCOUNTER — Ambulatory Visit (HOSPITAL_COMMUNITY)
Admission: RE | Admit: 2023-11-12 | Discharge: 2023-11-12 | Disposition: A | Discharge status: Home / Self Care | Payer: Medicare HMO | Source: Ambulatory Visit | Attending: Internal Medicine | Admitting: Internal Medicine

## 2023-11-12 DIAGNOSIS — M810 Age-related osteoporosis without current pathological fracture: Secondary | ICD-10-CM | POA: Insufficient documentation

## 2023-11-12 MED ORDER — ZOLEDRONIC ACID 5 MG/100ML IV SOLN
5.0000 mg | Freq: Once | INTRAVENOUS | Status: AC
  Administered 2023-11-12: 5 mg via INTRAVENOUS

## 2023-11-12 MED ORDER — ZOLEDRONIC ACID 5 MG/100ML IV SOLN
INTRAVENOUS | Status: AC
  Filled 2023-11-12: qty 100

## 2023-11-28 DIAGNOSIS — M6281 Muscle weakness (generalized): Secondary | ICD-10-CM | POA: Diagnosis not present

## 2023-11-28 DIAGNOSIS — E860 Dehydration: Secondary | ICD-10-CM | POA: Diagnosis not present

## 2023-11-28 DIAGNOSIS — G309 Alzheimer's disease, unspecified: Secondary | ICD-10-CM | POA: Diagnosis not present

## 2023-11-28 DIAGNOSIS — N1831 Chronic kidney disease, stage 3a: Secondary | ICD-10-CM | POA: Diagnosis not present

## 2023-11-28 DIAGNOSIS — R058 Other specified cough: Secondary | ICD-10-CM | POA: Diagnosis not present

## 2023-11-29 DIAGNOSIS — M179 Osteoarthritis of knee, unspecified: Secondary | ICD-10-CM | POA: Diagnosis not present

## 2023-11-29 DIAGNOSIS — Z79899 Other long term (current) drug therapy: Secondary | ICD-10-CM | POA: Diagnosis not present

## 2023-11-29 DIAGNOSIS — M545 Low back pain, unspecified: Secondary | ICD-10-CM | POA: Diagnosis not present

## 2023-11-29 DIAGNOSIS — G894 Chronic pain syndrome: Secondary | ICD-10-CM | POA: Diagnosis not present

## 2023-11-29 DIAGNOSIS — Z9181 History of falling: Secondary | ICD-10-CM | POA: Diagnosis not present

## 2023-11-29 DIAGNOSIS — M47816 Spondylosis without myelopathy or radiculopathy, lumbar region: Secondary | ICD-10-CM | POA: Diagnosis not present

## 2023-11-30 DIAGNOSIS — R0902 Hypoxemia: Secondary | ICD-10-CM | POA: Diagnosis not present

## 2023-11-30 DIAGNOSIS — F0393 Unspecified dementia, unspecified severity, with mood disturbance: Secondary | ICD-10-CM | POA: Diagnosis not present

## 2023-11-30 DIAGNOSIS — W19XXXA Unspecified fall, initial encounter: Secondary | ICD-10-CM | POA: Diagnosis not present

## 2023-11-30 DIAGNOSIS — M79604 Pain in right leg: Secondary | ICD-10-CM | POA: Diagnosis not present

## 2023-11-30 DIAGNOSIS — M79651 Pain in right thigh: Secondary | ICD-10-CM | POA: Diagnosis not present

## 2023-11-30 DIAGNOSIS — D649 Anemia, unspecified: Secondary | ICD-10-CM | POA: Diagnosis not present

## 2023-11-30 DIAGNOSIS — S72001A Fracture of unspecified part of neck of right femur, initial encounter for closed fracture: Secondary | ICD-10-CM | POA: Diagnosis not present

## 2023-11-30 DIAGNOSIS — R531 Weakness: Secondary | ICD-10-CM | POA: Diagnosis not present

## 2023-11-30 DIAGNOSIS — B961 Klebsiella pneumoniae [K. pneumoniae] as the cause of diseases classified elsewhere: Secondary | ICD-10-CM | POA: Diagnosis not present

## 2023-11-30 DIAGNOSIS — Z79899 Other long term (current) drug therapy: Secondary | ICD-10-CM | POA: Diagnosis not present

## 2023-11-30 DIAGNOSIS — Z7401 Bed confinement status: Secondary | ICD-10-CM | POA: Diagnosis not present

## 2023-11-30 DIAGNOSIS — S72141A Displaced intertrochanteric fracture of right femur, initial encounter for closed fracture: Secondary | ICD-10-CM | POA: Diagnosis not present

## 2023-11-30 DIAGNOSIS — J209 Acute bronchitis, unspecified: Secondary | ICD-10-CM | POA: Diagnosis not present

## 2023-11-30 DIAGNOSIS — N390 Urinary tract infection, site not specified: Secondary | ICD-10-CM | POA: Diagnosis not present

## 2023-11-30 DIAGNOSIS — G8929 Other chronic pain: Secondary | ICD-10-CM | POA: Diagnosis not present

## 2023-11-30 DIAGNOSIS — R0602 Shortness of breath: Secondary | ICD-10-CM | POA: Diagnosis not present

## 2023-11-30 DIAGNOSIS — E785 Hyperlipidemia, unspecified: Secondary | ICD-10-CM | POA: Diagnosis not present

## 2023-11-30 DIAGNOSIS — S72001D Fracture of unspecified part of neck of right femur, subsequent encounter for closed fracture with routine healing: Secondary | ICD-10-CM | POA: Diagnosis not present

## 2023-11-30 DIAGNOSIS — I10 Essential (primary) hypertension: Secondary | ICD-10-CM | POA: Diagnosis not present

## 2023-12-01 DIAGNOSIS — S72141A Displaced intertrochanteric fracture of right femur, initial encounter for closed fracture: Secondary | ICD-10-CM | POA: Diagnosis not present

## 2023-12-01 DIAGNOSIS — W19XXXA Unspecified fall, initial encounter: Secondary | ICD-10-CM | POA: Diagnosis not present

## 2023-12-04 DIAGNOSIS — Z79899 Other long term (current) drug therapy: Secondary | ICD-10-CM | POA: Diagnosis not present

## 2023-12-06 DIAGNOSIS — R059 Cough, unspecified: Secondary | ICD-10-CM | POA: Diagnosis not present

## 2023-12-06 DIAGNOSIS — F0393 Unspecified dementia, unspecified severity, with mood disturbance: Secondary | ICD-10-CM | POA: Diagnosis not present

## 2023-12-06 DIAGNOSIS — Z7401 Bed confinement status: Secondary | ICD-10-CM | POA: Diagnosis not present

## 2023-12-06 DIAGNOSIS — J9611 Chronic respiratory failure with hypoxia: Secondary | ICD-10-CM | POA: Diagnosis not present

## 2023-12-06 DIAGNOSIS — R262 Difficulty in walking, not elsewhere classified: Secondary | ICD-10-CM | POA: Diagnosis not present

## 2023-12-06 DIAGNOSIS — Z9889 Other specified postprocedural states: Secondary | ICD-10-CM | POA: Diagnosis not present

## 2023-12-06 DIAGNOSIS — S72001D Fracture of unspecified part of neck of right femur, subsequent encounter for closed fracture with routine healing: Secondary | ICD-10-CM | POA: Diagnosis not present

## 2023-12-06 DIAGNOSIS — S72141A Displaced intertrochanteric fracture of right femur, initial encounter for closed fracture: Secondary | ICD-10-CM | POA: Diagnosis not present

## 2023-12-06 DIAGNOSIS — J159 Unspecified bacterial pneumonia: Secondary | ICD-10-CM | POA: Diagnosis not present

## 2023-12-06 DIAGNOSIS — J9601 Acute respiratory failure with hypoxia: Secondary | ICD-10-CM | POA: Diagnosis not present

## 2023-12-06 DIAGNOSIS — R079 Chest pain, unspecified: Secondary | ICD-10-CM | POA: Diagnosis not present

## 2023-12-06 DIAGNOSIS — R918 Other nonspecific abnormal finding of lung field: Secondary | ICD-10-CM | POA: Diagnosis not present

## 2023-12-06 DIAGNOSIS — Z7982 Long term (current) use of aspirin: Secondary | ICD-10-CM | POA: Diagnosis not present

## 2023-12-06 DIAGNOSIS — I110 Hypertensive heart disease with heart failure: Secondary | ICD-10-CM | POA: Diagnosis not present

## 2023-12-06 DIAGNOSIS — Z79899 Other long term (current) drug therapy: Secondary | ICD-10-CM | POA: Diagnosis not present

## 2023-12-06 DIAGNOSIS — R0902 Hypoxemia: Secondary | ICD-10-CM | POA: Diagnosis not present

## 2023-12-06 DIAGNOSIS — M810 Age-related osteoporosis without current pathological fracture: Secondary | ICD-10-CM | POA: Diagnosis not present

## 2023-12-06 DIAGNOSIS — J181 Lobar pneumonia, unspecified organism: Secondary | ICD-10-CM | POA: Diagnosis not present

## 2023-12-06 DIAGNOSIS — N390 Urinary tract infection, site not specified: Secondary | ICD-10-CM | POA: Diagnosis not present

## 2023-12-06 DIAGNOSIS — R531 Weakness: Secondary | ICD-10-CM | POA: Diagnosis not present

## 2023-12-06 DIAGNOSIS — N189 Chronic kidney disease, unspecified: Secondary | ICD-10-CM | POA: Diagnosis not present

## 2023-12-06 DIAGNOSIS — D696 Thrombocytopenia, unspecified: Secondary | ICD-10-CM | POA: Diagnosis not present

## 2023-12-06 DIAGNOSIS — E785 Hyperlipidemia, unspecified: Secondary | ICD-10-CM | POA: Diagnosis not present

## 2023-12-06 DIAGNOSIS — R600 Localized edema: Secondary | ICD-10-CM | POA: Diagnosis not present

## 2023-12-06 DIAGNOSIS — D649 Anemia, unspecified: Secondary | ICD-10-CM | POA: Diagnosis not present

## 2023-12-06 DIAGNOSIS — I509 Heart failure, unspecified: Secondary | ICD-10-CM | POA: Diagnosis not present

## 2023-12-06 DIAGNOSIS — I10 Essential (primary) hypertension: Secondary | ICD-10-CM | POA: Diagnosis not present

## 2023-12-07 DIAGNOSIS — R262 Difficulty in walking, not elsewhere classified: Secondary | ICD-10-CM | POA: Diagnosis not present

## 2023-12-07 DIAGNOSIS — S72001D Fracture of unspecified part of neck of right femur, subsequent encounter for closed fracture with routine healing: Secondary | ICD-10-CM | POA: Diagnosis not present

## 2023-12-07 DIAGNOSIS — D649 Anemia, unspecified: Secondary | ICD-10-CM | POA: Diagnosis not present

## 2023-12-07 DIAGNOSIS — M810 Age-related osteoporosis without current pathological fracture: Secondary | ICD-10-CM | POA: Diagnosis not present

## 2023-12-24 DIAGNOSIS — R600 Localized edema: Secondary | ICD-10-CM | POA: Diagnosis not present

## 2023-12-24 DIAGNOSIS — N189 Chronic kidney disease, unspecified: Secondary | ICD-10-CM | POA: Diagnosis not present

## 2023-12-24 DIAGNOSIS — D696 Thrombocytopenia, unspecified: Secondary | ICD-10-CM | POA: Diagnosis not present

## 2023-12-24 DIAGNOSIS — Z79899 Other long term (current) drug therapy: Secondary | ICD-10-CM | POA: Diagnosis not present

## 2023-12-24 DIAGNOSIS — D649 Anemia, unspecified: Secondary | ICD-10-CM | POA: Diagnosis not present

## 2023-12-24 DIAGNOSIS — Z9889 Other specified postprocedural states: Secondary | ICD-10-CM | POA: Diagnosis not present

## 2023-12-24 DIAGNOSIS — E785 Hyperlipidemia, unspecified: Secondary | ICD-10-CM | POA: Diagnosis not present

## 2023-12-24 DIAGNOSIS — Z7982 Long term (current) use of aspirin: Secondary | ICD-10-CM | POA: Diagnosis not present

## 2023-12-24 DIAGNOSIS — R079 Chest pain, unspecified: Secondary | ICD-10-CM | POA: Diagnosis not present

## 2023-12-24 DIAGNOSIS — J9611 Chronic respiratory failure with hypoxia: Secondary | ICD-10-CM | POA: Diagnosis not present

## 2023-12-24 DIAGNOSIS — I509 Heart failure, unspecified: Secondary | ICD-10-CM | POA: Diagnosis not present

## 2023-12-24 DIAGNOSIS — J159 Unspecified bacterial pneumonia: Secondary | ICD-10-CM | POA: Diagnosis not present

## 2023-12-24 DIAGNOSIS — J9601 Acute respiratory failure with hypoxia: Secondary | ICD-10-CM | POA: Diagnosis not present

## 2023-12-24 DIAGNOSIS — J181 Lobar pneumonia, unspecified organism: Secondary | ICD-10-CM | POA: Diagnosis not present

## 2023-12-24 DIAGNOSIS — I110 Hypertensive heart disease with heart failure: Secondary | ICD-10-CM | POA: Diagnosis not present

## 2023-12-24 DIAGNOSIS — R918 Other nonspecific abnormal finding of lung field: Secondary | ICD-10-CM | POA: Diagnosis not present

## 2023-12-26 DIAGNOSIS — J9601 Acute respiratory failure with hypoxia: Secondary | ICD-10-CM | POA: Diagnosis not present

## 2023-12-26 DIAGNOSIS — Z7982 Long term (current) use of aspirin: Secondary | ICD-10-CM | POA: Diagnosis not present

## 2023-12-26 DIAGNOSIS — D696 Thrombocytopenia, unspecified: Secondary | ICD-10-CM | POA: Diagnosis not present

## 2023-12-26 DIAGNOSIS — I509 Heart failure, unspecified: Secondary | ICD-10-CM | POA: Diagnosis not present

## 2023-12-26 DIAGNOSIS — R600 Localized edema: Secondary | ICD-10-CM | POA: Diagnosis not present

## 2023-12-26 DIAGNOSIS — Z79899 Other long term (current) drug therapy: Secondary | ICD-10-CM | POA: Diagnosis not present

## 2023-12-26 DIAGNOSIS — J159 Unspecified bacterial pneumonia: Secondary | ICD-10-CM | POA: Diagnosis not present

## 2023-12-26 DIAGNOSIS — I110 Hypertensive heart disease with heart failure: Secondary | ICD-10-CM | POA: Diagnosis not present

## 2023-12-26 DIAGNOSIS — E785 Hyperlipidemia, unspecified: Secondary | ICD-10-CM | POA: Diagnosis not present

## 2023-12-27 DIAGNOSIS — R54 Age-related physical debility: Secondary | ICD-10-CM | POA: Diagnosis not present

## 2023-12-27 DIAGNOSIS — D649 Anemia, unspecified: Secondary | ICD-10-CM | POA: Diagnosis not present

## 2023-12-27 DIAGNOSIS — J159 Unspecified bacterial pneumonia: Secondary | ICD-10-CM | POA: Diagnosis not present

## 2023-12-27 DIAGNOSIS — E785 Hyperlipidemia, unspecified: Secondary | ICD-10-CM | POA: Diagnosis not present

## 2023-12-27 DIAGNOSIS — R059 Cough, unspecified: Secondary | ICD-10-CM | POA: Diagnosis not present

## 2023-12-27 DIAGNOSIS — R600 Localized edema: Secondary | ICD-10-CM | POA: Diagnosis not present

## 2023-12-27 DIAGNOSIS — Z79899 Other long term (current) drug therapy: Secondary | ICD-10-CM | POA: Diagnosis not present

## 2023-12-27 DIAGNOSIS — J9621 Acute and chronic respiratory failure with hypoxia: Secondary | ICD-10-CM | POA: Diagnosis not present

## 2023-12-27 DIAGNOSIS — I959 Hypotension, unspecified: Secondary | ICD-10-CM | POA: Diagnosis not present

## 2023-12-27 DIAGNOSIS — Z7401 Bed confinement status: Secondary | ICD-10-CM | POA: Diagnosis not present

## 2023-12-27 DIAGNOSIS — E46 Unspecified protein-calorie malnutrition: Secondary | ICD-10-CM | POA: Diagnosis not present

## 2023-12-27 DIAGNOSIS — R0789 Other chest pain: Secondary | ICD-10-CM | POA: Diagnosis not present

## 2023-12-27 DIAGNOSIS — R531 Weakness: Secondary | ICD-10-CM | POA: Diagnosis not present

## 2023-12-27 DIAGNOSIS — N189 Chronic kidney disease, unspecified: Secondary | ICD-10-CM | POA: Diagnosis not present

## 2023-12-27 DIAGNOSIS — D696 Thrombocytopenia, unspecified: Secondary | ICD-10-CM | POA: Diagnosis not present

## 2023-12-27 DIAGNOSIS — R2689 Other abnormalities of gait and mobility: Secondary | ICD-10-CM | POA: Diagnosis not present

## 2023-12-27 DIAGNOSIS — M6281 Muscle weakness (generalized): Secondary | ICD-10-CM | POA: Diagnosis not present

## 2023-12-27 DIAGNOSIS — J189 Pneumonia, unspecified organism: Secondary | ICD-10-CM | POA: Diagnosis not present

## 2023-12-27 DIAGNOSIS — I110 Hypertensive heart disease with heart failure: Secondary | ICD-10-CM | POA: Diagnosis not present

## 2023-12-27 DIAGNOSIS — I509 Heart failure, unspecified: Secondary | ICD-10-CM | POA: Diagnosis not present

## 2023-12-27 DIAGNOSIS — J9601 Acute respiratory failure with hypoxia: Secondary | ICD-10-CM | POA: Diagnosis not present

## 2023-12-27 DIAGNOSIS — R2681 Unsteadiness on feet: Secondary | ICD-10-CM | POA: Diagnosis not present

## 2023-12-27 DIAGNOSIS — Z7982 Long term (current) use of aspirin: Secondary | ICD-10-CM | POA: Diagnosis not present

## 2023-12-27 DIAGNOSIS — R5381 Other malaise: Secondary | ICD-10-CM | POA: Diagnosis not present

## 2023-12-31 DIAGNOSIS — D649 Anemia, unspecified: Secondary | ICD-10-CM | POA: Diagnosis not present

## 2023-12-31 DIAGNOSIS — R5381 Other malaise: Secondary | ICD-10-CM | POA: Diagnosis not present

## 2023-12-31 DIAGNOSIS — J189 Pneumonia, unspecified organism: Secondary | ICD-10-CM | POA: Diagnosis not present

## 2023-12-31 DIAGNOSIS — J9621 Acute and chronic respiratory failure with hypoxia: Secondary | ICD-10-CM | POA: Diagnosis not present

## 2024-01-23 DIAGNOSIS — E46 Unspecified protein-calorie malnutrition: Secondary | ICD-10-CM | POA: Diagnosis not present

## 2024-01-23 DIAGNOSIS — E039 Hypothyroidism, unspecified: Secondary | ICD-10-CM | POA: Diagnosis not present

## 2024-01-23 DIAGNOSIS — F329 Major depressive disorder, single episode, unspecified: Secondary | ICD-10-CM | POA: Diagnosis not present

## 2024-01-23 DIAGNOSIS — J159 Unspecified bacterial pneumonia: Secondary | ICD-10-CM | POA: Diagnosis not present

## 2024-01-23 DIAGNOSIS — I509 Heart failure, unspecified: Secondary | ICD-10-CM | POA: Diagnosis not present

## 2024-01-23 DIAGNOSIS — F0284 Dementia in other diseases classified elsewhere, unspecified severity, with anxiety: Secondary | ICD-10-CM | POA: Diagnosis not present

## 2024-01-23 DIAGNOSIS — I130 Hypertensive heart and chronic kidney disease with heart failure and stage 1 through stage 4 chronic kidney disease, or unspecified chronic kidney disease: Secondary | ICD-10-CM | POA: Diagnosis not present

## 2024-01-23 DIAGNOSIS — J9601 Acute respiratory failure with hypoxia: Secondary | ICD-10-CM | POA: Diagnosis not present

## 2024-01-23 DIAGNOSIS — F0283 Dementia in other diseases classified elsewhere, unspecified severity, with mood disturbance: Secondary | ICD-10-CM | POA: Diagnosis not present

## 2024-01-24 DIAGNOSIS — F0283 Dementia in other diseases classified elsewhere, unspecified severity, with mood disturbance: Secondary | ICD-10-CM | POA: Diagnosis not present

## 2024-01-24 DIAGNOSIS — F329 Major depressive disorder, single episode, unspecified: Secondary | ICD-10-CM | POA: Diagnosis not present

## 2024-01-24 DIAGNOSIS — J9601 Acute respiratory failure with hypoxia: Secondary | ICD-10-CM | POA: Diagnosis not present

## 2024-01-24 DIAGNOSIS — I130 Hypertensive heart and chronic kidney disease with heart failure and stage 1 through stage 4 chronic kidney disease, or unspecified chronic kidney disease: Secondary | ICD-10-CM | POA: Diagnosis not present

## 2024-01-24 DIAGNOSIS — I509 Heart failure, unspecified: Secondary | ICD-10-CM | POA: Diagnosis not present

## 2024-01-24 DIAGNOSIS — E46 Unspecified protein-calorie malnutrition: Secondary | ICD-10-CM | POA: Diagnosis not present

## 2024-01-24 DIAGNOSIS — E039 Hypothyroidism, unspecified: Secondary | ICD-10-CM | POA: Diagnosis not present

## 2024-01-24 DIAGNOSIS — F0284 Dementia in other diseases classified elsewhere, unspecified severity, with anxiety: Secondary | ICD-10-CM | POA: Diagnosis not present

## 2024-01-24 DIAGNOSIS — J159 Unspecified bacterial pneumonia: Secondary | ICD-10-CM | POA: Diagnosis not present

## 2024-01-26 DIAGNOSIS — F0284 Dementia in other diseases classified elsewhere, unspecified severity, with anxiety: Secondary | ICD-10-CM | POA: Diagnosis not present

## 2024-01-26 DIAGNOSIS — I5033 Acute on chronic diastolic (congestive) heart failure: Secondary | ICD-10-CM | POA: Diagnosis not present

## 2024-01-26 DIAGNOSIS — R0602 Shortness of breath: Secondary | ICD-10-CM | POA: Diagnosis not present

## 2024-01-26 DIAGNOSIS — I509 Heart failure, unspecified: Secondary | ICD-10-CM | POA: Diagnosis not present

## 2024-01-26 DIAGNOSIS — I110 Hypertensive heart disease with heart failure: Secondary | ICD-10-CM | POA: Diagnosis not present

## 2024-01-26 DIAGNOSIS — F419 Anxiety disorder, unspecified: Secondary | ICD-10-CM | POA: Diagnosis not present

## 2024-01-26 DIAGNOSIS — J9620 Acute and chronic respiratory failure, unspecified whether with hypoxia or hypercapnia: Secondary | ICD-10-CM | POA: Diagnosis not present

## 2024-01-26 DIAGNOSIS — R531 Weakness: Secondary | ICD-10-CM | POA: Diagnosis not present

## 2024-01-26 DIAGNOSIS — F0283 Dementia in other diseases classified elsewhere, unspecified severity, with mood disturbance: Secondary | ICD-10-CM | POA: Diagnosis not present

## 2024-01-26 DIAGNOSIS — F0393 Unspecified dementia, unspecified severity, with mood disturbance: Secondary | ICD-10-CM | POA: Diagnosis not present

## 2024-01-26 DIAGNOSIS — I130 Hypertensive heart and chronic kidney disease with heart failure and stage 1 through stage 4 chronic kidney disease, or unspecified chronic kidney disease: Secondary | ICD-10-CM | POA: Diagnosis not present

## 2024-01-26 DIAGNOSIS — J9621 Acute and chronic respiratory failure with hypoxia: Secondary | ICD-10-CM | POA: Diagnosis not present

## 2024-01-26 DIAGNOSIS — R609 Edema, unspecified: Secondary | ICD-10-CM | POA: Diagnosis not present

## 2024-01-26 DIAGNOSIS — Z7401 Bed confinement status: Secondary | ICD-10-CM | POA: Diagnosis not present

## 2024-01-26 DIAGNOSIS — R069 Unspecified abnormalities of breathing: Secondary | ICD-10-CM | POA: Diagnosis not present

## 2024-01-26 DIAGNOSIS — J9601 Acute respiratory failure with hypoxia: Secondary | ICD-10-CM | POA: Diagnosis not present

## 2024-01-26 DIAGNOSIS — E46 Unspecified protein-calorie malnutrition: Secondary | ICD-10-CM | POA: Diagnosis not present

## 2024-01-26 DIAGNOSIS — R0902 Hypoxemia: Secondary | ICD-10-CM | POA: Diagnosis not present

## 2024-01-26 DIAGNOSIS — M1990 Unspecified osteoarthritis, unspecified site: Secondary | ICD-10-CM | POA: Diagnosis not present

## 2024-01-26 DIAGNOSIS — R7989 Other specified abnormal findings of blood chemistry: Secondary | ICD-10-CM | POA: Diagnosis not present

## 2024-01-26 DIAGNOSIS — F329 Major depressive disorder, single episode, unspecified: Secondary | ICD-10-CM | POA: Diagnosis not present

## 2024-01-26 DIAGNOSIS — I361 Nonrheumatic tricuspid (valve) insufficiency: Secondary | ICD-10-CM | POA: Diagnosis not present

## 2024-01-26 DIAGNOSIS — E039 Hypothyroidism, unspecified: Secondary | ICD-10-CM | POA: Diagnosis not present

## 2024-01-26 DIAGNOSIS — D649 Anemia, unspecified: Secondary | ICD-10-CM | POA: Diagnosis not present

## 2024-01-26 DIAGNOSIS — I10 Essential (primary) hypertension: Secondary | ICD-10-CM | POA: Diagnosis not present

## 2024-01-26 DIAGNOSIS — I21A1 Myocardial infarction type 2: Secondary | ICD-10-CM | POA: Diagnosis not present

## 2024-01-26 DIAGNOSIS — J159 Unspecified bacterial pneumonia: Secondary | ICD-10-CM | POA: Diagnosis not present

## 2024-01-26 DIAGNOSIS — A419 Sepsis, unspecified organism: Secondary | ICD-10-CM | POA: Diagnosis not present

## 2024-01-27 DIAGNOSIS — I5033 Acute on chronic diastolic (congestive) heart failure: Secondary | ICD-10-CM | POA: Diagnosis not present

## 2024-01-27 DIAGNOSIS — I21A1 Myocardial infarction type 2: Secondary | ICD-10-CM | POA: Diagnosis not present

## 2024-01-27 DIAGNOSIS — I110 Hypertensive heart disease with heart failure: Secondary | ICD-10-CM | POA: Diagnosis not present

## 2024-01-27 DIAGNOSIS — I361 Nonrheumatic tricuspid (valve) insufficiency: Secondary | ICD-10-CM | POA: Diagnosis not present

## 2024-01-28 DIAGNOSIS — I5033 Acute on chronic diastolic (congestive) heart failure: Secondary | ICD-10-CM | POA: Diagnosis not present

## 2024-01-28 DIAGNOSIS — I21A1 Myocardial infarction type 2: Secondary | ICD-10-CM | POA: Diagnosis not present

## 2024-01-28 DIAGNOSIS — I110 Hypertensive heart disease with heart failure: Secondary | ICD-10-CM | POA: Diagnosis not present

## 2024-01-29 DIAGNOSIS — I5033 Acute on chronic diastolic (congestive) heart failure: Secondary | ICD-10-CM | POA: Diagnosis not present

## 2024-01-29 DIAGNOSIS — I21A1 Myocardial infarction type 2: Secondary | ICD-10-CM | POA: Diagnosis not present

## 2024-01-29 DIAGNOSIS — I110 Hypertensive heart disease with heart failure: Secondary | ICD-10-CM | POA: Diagnosis not present

## 2024-01-30 DIAGNOSIS — I21A1 Myocardial infarction type 2: Secondary | ICD-10-CM | POA: Diagnosis not present

## 2024-01-30 DIAGNOSIS — I5033 Acute on chronic diastolic (congestive) heart failure: Secondary | ICD-10-CM | POA: Diagnosis not present

## 2024-01-30 DIAGNOSIS — I110 Hypertensive heart disease with heart failure: Secondary | ICD-10-CM | POA: Diagnosis not present

## 2024-01-31 DIAGNOSIS — I110 Hypertensive heart disease with heart failure: Secondary | ICD-10-CM | POA: Diagnosis not present

## 2024-01-31 DIAGNOSIS — I5033 Acute on chronic diastolic (congestive) heart failure: Secondary | ICD-10-CM | POA: Diagnosis not present

## 2024-01-31 DIAGNOSIS — I21A1 Myocardial infarction type 2: Secondary | ICD-10-CM | POA: Diagnosis not present

## 2024-02-01 DIAGNOSIS — I5033 Acute on chronic diastolic (congestive) heart failure: Secondary | ICD-10-CM | POA: Diagnosis not present

## 2024-02-01 DIAGNOSIS — Z7401 Bed confinement status: Secondary | ICD-10-CM | POA: Diagnosis not present

## 2024-02-01 DIAGNOSIS — J9621 Acute and chronic respiratory failure with hypoxia: Secondary | ICD-10-CM | POA: Diagnosis not present

## 2024-02-01 DIAGNOSIS — I21A1 Myocardial infarction type 2: Secondary | ICD-10-CM | POA: Diagnosis not present

## 2024-02-01 DIAGNOSIS — R262 Difficulty in walking, not elsewhere classified: Secondary | ICD-10-CM | POA: Diagnosis not present

## 2024-02-01 DIAGNOSIS — I110 Hypertensive heart disease with heart failure: Secondary | ICD-10-CM | POA: Diagnosis not present

## 2024-02-01 DIAGNOSIS — D649 Anemia, unspecified: Secondary | ICD-10-CM | POA: Diagnosis not present

## 2024-02-01 DIAGNOSIS — R069 Unspecified abnormalities of breathing: Secondary | ICD-10-CM | POA: Diagnosis not present

## 2024-02-01 DIAGNOSIS — R531 Weakness: Secondary | ICD-10-CM | POA: Diagnosis not present

## 2024-02-01 DIAGNOSIS — J9620 Acute and chronic respiratory failure, unspecified whether with hypoxia or hypercapnia: Secondary | ICD-10-CM | POA: Diagnosis not present

## 2024-02-05 DIAGNOSIS — J9621 Acute and chronic respiratory failure with hypoxia: Secondary | ICD-10-CM | POA: Diagnosis not present

## 2024-02-05 DIAGNOSIS — I5033 Acute on chronic diastolic (congestive) heart failure: Secondary | ICD-10-CM | POA: Diagnosis not present

## 2024-02-05 DIAGNOSIS — D649 Anemia, unspecified: Secondary | ICD-10-CM | POA: Diagnosis not present

## 2024-02-05 DIAGNOSIS — R262 Difficulty in walking, not elsewhere classified: Secondary | ICD-10-CM | POA: Diagnosis not present

## 2024-02-20 DIAGNOSIS — I130 Hypertensive heart and chronic kidney disease with heart failure and stage 1 through stage 4 chronic kidney disease, or unspecified chronic kidney disease: Secondary | ICD-10-CM | POA: Diagnosis not present

## 2024-02-20 DIAGNOSIS — N183 Chronic kidney disease, stage 3 unspecified: Secondary | ICD-10-CM | POA: Diagnosis not present

## 2024-02-20 DIAGNOSIS — I5033 Acute on chronic diastolic (congestive) heart failure: Secondary | ICD-10-CM | POA: Diagnosis not present

## 2024-02-20 DIAGNOSIS — F418 Other specified anxiety disorders: Secondary | ICD-10-CM | POA: Diagnosis not present

## 2024-02-20 DIAGNOSIS — I5032 Chronic diastolic (congestive) heart failure: Secondary | ICD-10-CM | POA: Diagnosis not present

## 2024-02-20 DIAGNOSIS — F329 Major depressive disorder, single episode, unspecified: Secondary | ICD-10-CM | POA: Diagnosis not present

## 2024-02-20 DIAGNOSIS — J9621 Acute and chronic respiratory failure with hypoxia: Secondary | ICD-10-CM | POA: Diagnosis not present

## 2024-02-20 DIAGNOSIS — F0283 Dementia in other diseases classified elsewhere, unspecified severity, with mood disturbance: Secondary | ICD-10-CM | POA: Diagnosis not present

## 2024-02-20 DIAGNOSIS — E039 Hypothyroidism, unspecified: Secondary | ICD-10-CM | POA: Diagnosis not present

## 2024-02-20 DIAGNOSIS — F0284 Dementia in other diseases classified elsewhere, unspecified severity, with anxiety: Secondary | ICD-10-CM | POA: Diagnosis not present

## 2024-02-22 DIAGNOSIS — S72141A Displaced intertrochanteric fracture of right femur, initial encounter for closed fracture: Secondary | ICD-10-CM | POA: Diagnosis not present

## 2024-02-25 DIAGNOSIS — N183 Chronic kidney disease, stage 3 unspecified: Secondary | ICD-10-CM | POA: Diagnosis not present

## 2024-02-25 DIAGNOSIS — F0284 Dementia in other diseases classified elsewhere, unspecified severity, with anxiety: Secondary | ICD-10-CM | POA: Diagnosis not present

## 2024-02-25 DIAGNOSIS — F329 Major depressive disorder, single episode, unspecified: Secondary | ICD-10-CM | POA: Diagnosis not present

## 2024-02-25 DIAGNOSIS — I130 Hypertensive heart and chronic kidney disease with heart failure and stage 1 through stage 4 chronic kidney disease, or unspecified chronic kidney disease: Secondary | ICD-10-CM | POA: Diagnosis not present

## 2024-02-25 DIAGNOSIS — I5033 Acute on chronic diastolic (congestive) heart failure: Secondary | ICD-10-CM | POA: Diagnosis not present

## 2024-02-25 DIAGNOSIS — J9621 Acute and chronic respiratory failure with hypoxia: Secondary | ICD-10-CM | POA: Diagnosis not present

## 2024-02-25 DIAGNOSIS — F0283 Dementia in other diseases classified elsewhere, unspecified severity, with mood disturbance: Secondary | ICD-10-CM | POA: Diagnosis not present

## 2024-02-25 DIAGNOSIS — F418 Other specified anxiety disorders: Secondary | ICD-10-CM | POA: Diagnosis not present

## 2024-02-25 DIAGNOSIS — E039 Hypothyroidism, unspecified: Secondary | ICD-10-CM | POA: Diagnosis not present

## 2024-02-26 DIAGNOSIS — M810 Age-related osteoporosis without current pathological fracture: Secondary | ICD-10-CM | POA: Diagnosis not present

## 2024-02-26 DIAGNOSIS — I5032 Chronic diastolic (congestive) heart failure: Secondary | ICD-10-CM | POA: Diagnosis not present

## 2024-02-26 DIAGNOSIS — E039 Hypothyroidism, unspecified: Secondary | ICD-10-CM | POA: Diagnosis not present

## 2024-02-26 DIAGNOSIS — E7800 Pure hypercholesterolemia, unspecified: Secondary | ICD-10-CM | POA: Diagnosis not present

## 2024-02-26 DIAGNOSIS — R2689 Other abnormalities of gait and mobility: Secondary | ICD-10-CM | POA: Diagnosis not present

## 2024-02-26 DIAGNOSIS — D631 Anemia in chronic kidney disease: Secondary | ICD-10-CM | POA: Diagnosis not present

## 2024-02-26 DIAGNOSIS — N1831 Chronic kidney disease, stage 3a: Secondary | ICD-10-CM | POA: Diagnosis not present

## 2024-02-26 DIAGNOSIS — I130 Hypertensive heart and chronic kidney disease with heart failure and stage 1 through stage 4 chronic kidney disease, or unspecified chronic kidney disease: Secondary | ICD-10-CM | POA: Diagnosis not present

## 2024-02-26 DIAGNOSIS — S72141A Displaced intertrochanteric fracture of right femur, initial encounter for closed fracture: Secondary | ICD-10-CM | POA: Diagnosis not present

## 2024-02-26 DIAGNOSIS — I129 Hypertensive chronic kidney disease with stage 1 through stage 4 chronic kidney disease, or unspecified chronic kidney disease: Secondary | ICD-10-CM | POA: Diagnosis not present

## 2024-02-26 DIAGNOSIS — M1712 Unilateral primary osteoarthritis, left knee: Secondary | ICD-10-CM | POA: Diagnosis not present

## 2024-02-26 DIAGNOSIS — M47816 Spondylosis without myelopathy or radiculopathy, lumbar region: Secondary | ICD-10-CM | POA: Diagnosis not present

## 2024-02-26 DIAGNOSIS — Z9181 History of falling: Secondary | ICD-10-CM | POA: Diagnosis not present

## 2024-02-26 DIAGNOSIS — J9611 Chronic respiratory failure with hypoxia: Secondary | ICD-10-CM | POA: Diagnosis not present

## 2024-02-26 DIAGNOSIS — G309 Alzheimer's disease, unspecified: Secondary | ICD-10-CM | POA: Diagnosis not present

## 2024-02-26 DIAGNOSIS — Z79899 Other long term (current) drug therapy: Secondary | ICD-10-CM | POA: Diagnosis not present

## 2024-02-27 DIAGNOSIS — I5033 Acute on chronic diastolic (congestive) heart failure: Secondary | ICD-10-CM | POA: Diagnosis not present

## 2024-02-27 DIAGNOSIS — E039 Hypothyroidism, unspecified: Secondary | ICD-10-CM | POA: Diagnosis not present

## 2024-02-27 DIAGNOSIS — J9621 Acute and chronic respiratory failure with hypoxia: Secondary | ICD-10-CM | POA: Diagnosis not present

## 2024-02-27 DIAGNOSIS — I130 Hypertensive heart and chronic kidney disease with heart failure and stage 1 through stage 4 chronic kidney disease, or unspecified chronic kidney disease: Secondary | ICD-10-CM | POA: Diagnosis not present

## 2024-02-27 DIAGNOSIS — F329 Major depressive disorder, single episode, unspecified: Secondary | ICD-10-CM | POA: Diagnosis not present

## 2024-02-27 DIAGNOSIS — N183 Chronic kidney disease, stage 3 unspecified: Secondary | ICD-10-CM | POA: Diagnosis not present

## 2024-02-27 DIAGNOSIS — F418 Other specified anxiety disorders: Secondary | ICD-10-CM | POA: Diagnosis not present

## 2024-02-27 DIAGNOSIS — F0283 Dementia in other diseases classified elsewhere, unspecified severity, with mood disturbance: Secondary | ICD-10-CM | POA: Diagnosis not present

## 2024-02-27 DIAGNOSIS — F0284 Dementia in other diseases classified elsewhere, unspecified severity, with anxiety: Secondary | ICD-10-CM | POA: Diagnosis not present

## 2024-02-28 DIAGNOSIS — F0284 Dementia in other diseases classified elsewhere, unspecified severity, with anxiety: Secondary | ICD-10-CM | POA: Diagnosis not present

## 2024-02-28 DIAGNOSIS — E039 Hypothyroidism, unspecified: Secondary | ICD-10-CM | POA: Diagnosis not present

## 2024-02-28 DIAGNOSIS — F329 Major depressive disorder, single episode, unspecified: Secondary | ICD-10-CM | POA: Diagnosis not present

## 2024-02-28 DIAGNOSIS — I5033 Acute on chronic diastolic (congestive) heart failure: Secondary | ICD-10-CM | POA: Diagnosis not present

## 2024-02-28 DIAGNOSIS — J9621 Acute and chronic respiratory failure with hypoxia: Secondary | ICD-10-CM | POA: Diagnosis not present

## 2024-02-28 DIAGNOSIS — F418 Other specified anxiety disorders: Secondary | ICD-10-CM | POA: Diagnosis not present

## 2024-02-28 DIAGNOSIS — N183 Chronic kidney disease, stage 3 unspecified: Secondary | ICD-10-CM | POA: Diagnosis not present

## 2024-02-28 DIAGNOSIS — I130 Hypertensive heart and chronic kidney disease with heart failure and stage 1 through stage 4 chronic kidney disease, or unspecified chronic kidney disease: Secondary | ICD-10-CM | POA: Diagnosis not present

## 2024-02-28 DIAGNOSIS — F0283 Dementia in other diseases classified elsewhere, unspecified severity, with mood disturbance: Secondary | ICD-10-CM | POA: Diagnosis not present

## 2024-03-03 DIAGNOSIS — E039 Hypothyroidism, unspecified: Secondary | ICD-10-CM | POA: Diagnosis not present

## 2024-03-03 DIAGNOSIS — F0284 Dementia in other diseases classified elsewhere, unspecified severity, with anxiety: Secondary | ICD-10-CM | POA: Diagnosis not present

## 2024-03-03 DIAGNOSIS — F0283 Dementia in other diseases classified elsewhere, unspecified severity, with mood disturbance: Secondary | ICD-10-CM | POA: Diagnosis not present

## 2024-03-03 DIAGNOSIS — I5033 Acute on chronic diastolic (congestive) heart failure: Secondary | ICD-10-CM | POA: Diagnosis not present

## 2024-03-03 DIAGNOSIS — N183 Chronic kidney disease, stage 3 unspecified: Secondary | ICD-10-CM | POA: Diagnosis not present

## 2024-03-03 DIAGNOSIS — J9621 Acute and chronic respiratory failure with hypoxia: Secondary | ICD-10-CM | POA: Diagnosis not present

## 2024-03-03 DIAGNOSIS — F418 Other specified anxiety disorders: Secondary | ICD-10-CM | POA: Diagnosis not present

## 2024-03-03 DIAGNOSIS — I130 Hypertensive heart and chronic kidney disease with heart failure and stage 1 through stage 4 chronic kidney disease, or unspecified chronic kidney disease: Secondary | ICD-10-CM | POA: Diagnosis not present

## 2024-03-03 DIAGNOSIS — F329 Major depressive disorder, single episode, unspecified: Secondary | ICD-10-CM | POA: Diagnosis not present

## 2024-03-04 ENCOUNTER — Encounter: Payer: Self-pay | Admitting: Internal Medicine

## 2024-03-04 DIAGNOSIS — F0283 Dementia in other diseases classified elsewhere, unspecified severity, with mood disturbance: Secondary | ICD-10-CM | POA: Diagnosis not present

## 2024-03-04 DIAGNOSIS — I130 Hypertensive heart and chronic kidney disease with heart failure and stage 1 through stage 4 chronic kidney disease, or unspecified chronic kidney disease: Secondary | ICD-10-CM | POA: Diagnosis not present

## 2024-03-04 DIAGNOSIS — F418 Other specified anxiety disorders: Secondary | ICD-10-CM | POA: Diagnosis not present

## 2024-03-04 DIAGNOSIS — N183 Chronic kidney disease, stage 3 unspecified: Secondary | ICD-10-CM | POA: Diagnosis not present

## 2024-03-04 DIAGNOSIS — E039 Hypothyroidism, unspecified: Secondary | ICD-10-CM | POA: Diagnosis not present

## 2024-03-04 DIAGNOSIS — I5033 Acute on chronic diastolic (congestive) heart failure: Secondary | ICD-10-CM | POA: Diagnosis not present

## 2024-03-04 DIAGNOSIS — F329 Major depressive disorder, single episode, unspecified: Secondary | ICD-10-CM | POA: Diagnosis not present

## 2024-03-04 DIAGNOSIS — F0284 Dementia in other diseases classified elsewhere, unspecified severity, with anxiety: Secondary | ICD-10-CM | POA: Diagnosis not present

## 2024-03-04 DIAGNOSIS — J9621 Acute and chronic respiratory failure with hypoxia: Secondary | ICD-10-CM | POA: Diagnosis not present

## 2024-03-05 DIAGNOSIS — F329 Major depressive disorder, single episode, unspecified: Secondary | ICD-10-CM | POA: Diagnosis not present

## 2024-03-05 DIAGNOSIS — F0284 Dementia in other diseases classified elsewhere, unspecified severity, with anxiety: Secondary | ICD-10-CM | POA: Diagnosis not present

## 2024-03-05 DIAGNOSIS — E039 Hypothyroidism, unspecified: Secondary | ICD-10-CM | POA: Diagnosis not present

## 2024-03-05 DIAGNOSIS — I5033 Acute on chronic diastolic (congestive) heart failure: Secondary | ICD-10-CM | POA: Diagnosis not present

## 2024-03-05 DIAGNOSIS — F418 Other specified anxiety disorders: Secondary | ICD-10-CM | POA: Diagnosis not present

## 2024-03-05 DIAGNOSIS — I130 Hypertensive heart and chronic kidney disease with heart failure and stage 1 through stage 4 chronic kidney disease, or unspecified chronic kidney disease: Secondary | ICD-10-CM | POA: Diagnosis not present

## 2024-03-05 DIAGNOSIS — N183 Chronic kidney disease, stage 3 unspecified: Secondary | ICD-10-CM | POA: Diagnosis not present

## 2024-03-05 DIAGNOSIS — J9621 Acute and chronic respiratory failure with hypoxia: Secondary | ICD-10-CM | POA: Diagnosis not present

## 2024-03-05 DIAGNOSIS — F0283 Dementia in other diseases classified elsewhere, unspecified severity, with mood disturbance: Secondary | ICD-10-CM | POA: Diagnosis not present

## 2024-03-06 DIAGNOSIS — F329 Major depressive disorder, single episode, unspecified: Secondary | ICD-10-CM | POA: Diagnosis not present

## 2024-03-06 DIAGNOSIS — I130 Hypertensive heart and chronic kidney disease with heart failure and stage 1 through stage 4 chronic kidney disease, or unspecified chronic kidney disease: Secondary | ICD-10-CM | POA: Diagnosis not present

## 2024-03-06 DIAGNOSIS — I5033 Acute on chronic diastolic (congestive) heart failure: Secondary | ICD-10-CM | POA: Diagnosis not present

## 2024-03-06 DIAGNOSIS — E039 Hypothyroidism, unspecified: Secondary | ICD-10-CM | POA: Diagnosis not present

## 2024-03-06 DIAGNOSIS — F0283 Dementia in other diseases classified elsewhere, unspecified severity, with mood disturbance: Secondary | ICD-10-CM | POA: Diagnosis not present

## 2024-03-06 DIAGNOSIS — J9621 Acute and chronic respiratory failure with hypoxia: Secondary | ICD-10-CM | POA: Diagnosis not present

## 2024-03-06 DIAGNOSIS — F418 Other specified anxiety disorders: Secondary | ICD-10-CM | POA: Diagnosis not present

## 2024-03-06 DIAGNOSIS — N183 Chronic kidney disease, stage 3 unspecified: Secondary | ICD-10-CM | POA: Diagnosis not present

## 2024-03-06 DIAGNOSIS — F0284 Dementia in other diseases classified elsewhere, unspecified severity, with anxiety: Secondary | ICD-10-CM | POA: Diagnosis not present

## 2024-03-10 DIAGNOSIS — I5033 Acute on chronic diastolic (congestive) heart failure: Secondary | ICD-10-CM | POA: Diagnosis not present

## 2024-03-10 DIAGNOSIS — F418 Other specified anxiety disorders: Secondary | ICD-10-CM | POA: Diagnosis not present

## 2024-03-10 DIAGNOSIS — F329 Major depressive disorder, single episode, unspecified: Secondary | ICD-10-CM | POA: Diagnosis not present

## 2024-03-10 DIAGNOSIS — I130 Hypertensive heart and chronic kidney disease with heart failure and stage 1 through stage 4 chronic kidney disease, or unspecified chronic kidney disease: Secondary | ICD-10-CM | POA: Diagnosis not present

## 2024-03-10 DIAGNOSIS — N183 Chronic kidney disease, stage 3 unspecified: Secondary | ICD-10-CM | POA: Diagnosis not present

## 2024-03-10 DIAGNOSIS — J9621 Acute and chronic respiratory failure with hypoxia: Secondary | ICD-10-CM | POA: Diagnosis not present

## 2024-03-10 DIAGNOSIS — E039 Hypothyroidism, unspecified: Secondary | ICD-10-CM | POA: Diagnosis not present

## 2024-03-10 DIAGNOSIS — F0284 Dementia in other diseases classified elsewhere, unspecified severity, with anxiety: Secondary | ICD-10-CM | POA: Diagnosis not present

## 2024-03-10 DIAGNOSIS — F0283 Dementia in other diseases classified elsewhere, unspecified severity, with mood disturbance: Secondary | ICD-10-CM | POA: Diagnosis not present

## 2024-03-11 DIAGNOSIS — E039 Hypothyroidism, unspecified: Secondary | ICD-10-CM | POA: Diagnosis not present

## 2024-03-11 DIAGNOSIS — F0284 Dementia in other diseases classified elsewhere, unspecified severity, with anxiety: Secondary | ICD-10-CM | POA: Diagnosis not present

## 2024-03-11 DIAGNOSIS — J9621 Acute and chronic respiratory failure with hypoxia: Secondary | ICD-10-CM | POA: Diagnosis not present

## 2024-03-11 DIAGNOSIS — N183 Chronic kidney disease, stage 3 unspecified: Secondary | ICD-10-CM | POA: Diagnosis not present

## 2024-03-11 DIAGNOSIS — I130 Hypertensive heart and chronic kidney disease with heart failure and stage 1 through stage 4 chronic kidney disease, or unspecified chronic kidney disease: Secondary | ICD-10-CM | POA: Diagnosis not present

## 2024-03-11 DIAGNOSIS — F0283 Dementia in other diseases classified elsewhere, unspecified severity, with mood disturbance: Secondary | ICD-10-CM | POA: Diagnosis not present

## 2024-03-11 DIAGNOSIS — I5033 Acute on chronic diastolic (congestive) heart failure: Secondary | ICD-10-CM | POA: Diagnosis not present

## 2024-03-11 DIAGNOSIS — F418 Other specified anxiety disorders: Secondary | ICD-10-CM | POA: Diagnosis not present

## 2024-03-11 DIAGNOSIS — F329 Major depressive disorder, single episode, unspecified: Secondary | ICD-10-CM | POA: Diagnosis not present

## 2024-03-12 DIAGNOSIS — J9621 Acute and chronic respiratory failure with hypoxia: Secondary | ICD-10-CM | POA: Diagnosis not present

## 2024-03-12 DIAGNOSIS — F329 Major depressive disorder, single episode, unspecified: Secondary | ICD-10-CM | POA: Diagnosis not present

## 2024-03-12 DIAGNOSIS — F418 Other specified anxiety disorders: Secondary | ICD-10-CM | POA: Diagnosis not present

## 2024-03-12 DIAGNOSIS — I5033 Acute on chronic diastolic (congestive) heart failure: Secondary | ICD-10-CM | POA: Diagnosis not present

## 2024-03-12 DIAGNOSIS — E039 Hypothyroidism, unspecified: Secondary | ICD-10-CM | POA: Diagnosis not present

## 2024-03-12 DIAGNOSIS — F0284 Dementia in other diseases classified elsewhere, unspecified severity, with anxiety: Secondary | ICD-10-CM | POA: Diagnosis not present

## 2024-03-12 DIAGNOSIS — F0283 Dementia in other diseases classified elsewhere, unspecified severity, with mood disturbance: Secondary | ICD-10-CM | POA: Diagnosis not present

## 2024-03-12 DIAGNOSIS — I130 Hypertensive heart and chronic kidney disease with heart failure and stage 1 through stage 4 chronic kidney disease, or unspecified chronic kidney disease: Secondary | ICD-10-CM | POA: Diagnosis not present

## 2024-03-12 DIAGNOSIS — N183 Chronic kidney disease, stage 3 unspecified: Secondary | ICD-10-CM | POA: Diagnosis not present

## 2024-03-13 DIAGNOSIS — J9611 Chronic respiratory failure with hypoxia: Secondary | ICD-10-CM | POA: Diagnosis not present

## 2024-03-13 DIAGNOSIS — F4320 Adjustment disorder, unspecified: Secondary | ICD-10-CM | POA: Diagnosis not present

## 2024-03-13 DIAGNOSIS — L97521 Non-pressure chronic ulcer of other part of left foot limited to breakdown of skin: Secondary | ICD-10-CM | POA: Diagnosis not present

## 2024-03-13 DIAGNOSIS — I130 Hypertensive heart and chronic kidney disease with heart failure and stage 1 through stage 4 chronic kidney disease, or unspecified chronic kidney disease: Secondary | ICD-10-CM | POA: Diagnosis not present

## 2024-03-13 DIAGNOSIS — G309 Alzheimer's disease, unspecified: Secondary | ICD-10-CM | POA: Diagnosis not present

## 2024-03-13 DIAGNOSIS — I5032 Chronic diastolic (congestive) heart failure: Secondary | ICD-10-CM | POA: Diagnosis not present

## 2024-03-13 DIAGNOSIS — N1831 Chronic kidney disease, stage 3a: Secondary | ICD-10-CM | POA: Diagnosis not present

## 2024-03-13 DIAGNOSIS — S72141D Displaced intertrochanteric fracture of right femur, subsequent encounter for closed fracture with routine healing: Secondary | ICD-10-CM | POA: Diagnosis not present

## 2024-03-14 DIAGNOSIS — F0284 Dementia in other diseases classified elsewhere, unspecified severity, with anxiety: Secondary | ICD-10-CM | POA: Diagnosis not present

## 2024-03-14 DIAGNOSIS — I5033 Acute on chronic diastolic (congestive) heart failure: Secondary | ICD-10-CM | POA: Diagnosis not present

## 2024-03-14 DIAGNOSIS — I130 Hypertensive heart and chronic kidney disease with heart failure and stage 1 through stage 4 chronic kidney disease, or unspecified chronic kidney disease: Secondary | ICD-10-CM | POA: Diagnosis not present

## 2024-03-14 DIAGNOSIS — N183 Chronic kidney disease, stage 3 unspecified: Secondary | ICD-10-CM | POA: Diagnosis not present

## 2024-03-14 DIAGNOSIS — E039 Hypothyroidism, unspecified: Secondary | ICD-10-CM | POA: Diagnosis not present

## 2024-03-14 DIAGNOSIS — F329 Major depressive disorder, single episode, unspecified: Secondary | ICD-10-CM | POA: Diagnosis not present

## 2024-03-14 DIAGNOSIS — F0283 Dementia in other diseases classified elsewhere, unspecified severity, with mood disturbance: Secondary | ICD-10-CM | POA: Diagnosis not present

## 2024-03-14 DIAGNOSIS — J9621 Acute and chronic respiratory failure with hypoxia: Secondary | ICD-10-CM | POA: Diagnosis not present

## 2024-03-14 DIAGNOSIS — F418 Other specified anxiety disorders: Secondary | ICD-10-CM | POA: Diagnosis not present

## 2024-03-19 DIAGNOSIS — F418 Other specified anxiety disorders: Secondary | ICD-10-CM | POA: Diagnosis not present

## 2024-03-19 DIAGNOSIS — F0283 Dementia in other diseases classified elsewhere, unspecified severity, with mood disturbance: Secondary | ICD-10-CM | POA: Diagnosis not present

## 2024-03-19 DIAGNOSIS — F329 Major depressive disorder, single episode, unspecified: Secondary | ICD-10-CM | POA: Diagnosis not present

## 2024-03-19 DIAGNOSIS — I5033 Acute on chronic diastolic (congestive) heart failure: Secondary | ICD-10-CM | POA: Diagnosis not present

## 2024-03-19 DIAGNOSIS — I130 Hypertensive heart and chronic kidney disease with heart failure and stage 1 through stage 4 chronic kidney disease, or unspecified chronic kidney disease: Secondary | ICD-10-CM | POA: Diagnosis not present

## 2024-03-19 DIAGNOSIS — J9621 Acute and chronic respiratory failure with hypoxia: Secondary | ICD-10-CM | POA: Diagnosis not present

## 2024-03-19 DIAGNOSIS — F0284 Dementia in other diseases classified elsewhere, unspecified severity, with anxiety: Secondary | ICD-10-CM | POA: Diagnosis not present

## 2024-03-19 DIAGNOSIS — E039 Hypothyroidism, unspecified: Secondary | ICD-10-CM | POA: Diagnosis not present

## 2024-03-19 DIAGNOSIS — N183 Chronic kidney disease, stage 3 unspecified: Secondary | ICD-10-CM | POA: Diagnosis not present

## 2024-03-20 DIAGNOSIS — F0284 Dementia in other diseases classified elsewhere, unspecified severity, with anxiety: Secondary | ICD-10-CM | POA: Diagnosis not present

## 2024-03-20 DIAGNOSIS — N183 Chronic kidney disease, stage 3 unspecified: Secondary | ICD-10-CM | POA: Diagnosis not present

## 2024-03-20 DIAGNOSIS — E039 Hypothyroidism, unspecified: Secondary | ICD-10-CM | POA: Diagnosis not present

## 2024-03-20 DIAGNOSIS — F329 Major depressive disorder, single episode, unspecified: Secondary | ICD-10-CM | POA: Diagnosis not present

## 2024-03-20 DIAGNOSIS — J9621 Acute and chronic respiratory failure with hypoxia: Secondary | ICD-10-CM | POA: Diagnosis not present

## 2024-03-20 DIAGNOSIS — I130 Hypertensive heart and chronic kidney disease with heart failure and stage 1 through stage 4 chronic kidney disease, or unspecified chronic kidney disease: Secondary | ICD-10-CM | POA: Diagnosis not present

## 2024-03-20 DIAGNOSIS — I5033 Acute on chronic diastolic (congestive) heart failure: Secondary | ICD-10-CM | POA: Diagnosis not present

## 2024-03-20 DIAGNOSIS — F418 Other specified anxiety disorders: Secondary | ICD-10-CM | POA: Diagnosis not present

## 2024-03-20 DIAGNOSIS — F0283 Dementia in other diseases classified elsewhere, unspecified severity, with mood disturbance: Secondary | ICD-10-CM | POA: Diagnosis not present

## 2024-03-21 DIAGNOSIS — I5033 Acute on chronic diastolic (congestive) heart failure: Secondary | ICD-10-CM | POA: Diagnosis not present

## 2024-03-21 DIAGNOSIS — J9621 Acute and chronic respiratory failure with hypoxia: Secondary | ICD-10-CM | POA: Diagnosis not present

## 2024-03-21 DIAGNOSIS — F418 Other specified anxiety disorders: Secondary | ICD-10-CM | POA: Diagnosis not present

## 2024-03-21 DIAGNOSIS — F0283 Dementia in other diseases classified elsewhere, unspecified severity, with mood disturbance: Secondary | ICD-10-CM | POA: Diagnosis not present

## 2024-03-21 DIAGNOSIS — F0284 Dementia in other diseases classified elsewhere, unspecified severity, with anxiety: Secondary | ICD-10-CM | POA: Diagnosis not present

## 2024-03-21 DIAGNOSIS — I130 Hypertensive heart and chronic kidney disease with heart failure and stage 1 through stage 4 chronic kidney disease, or unspecified chronic kidney disease: Secondary | ICD-10-CM | POA: Diagnosis not present

## 2024-03-21 DIAGNOSIS — N183 Chronic kidney disease, stage 3 unspecified: Secondary | ICD-10-CM | POA: Diagnosis not present

## 2024-03-21 DIAGNOSIS — E039 Hypothyroidism, unspecified: Secondary | ICD-10-CM | POA: Diagnosis not present

## 2024-03-21 DIAGNOSIS — F329 Major depressive disorder, single episode, unspecified: Secondary | ICD-10-CM | POA: Diagnosis not present

## 2024-03-24 DIAGNOSIS — I130 Hypertensive heart and chronic kidney disease with heart failure and stage 1 through stage 4 chronic kidney disease, or unspecified chronic kidney disease: Secondary | ICD-10-CM | POA: Diagnosis not present

## 2024-03-24 DIAGNOSIS — N183 Chronic kidney disease, stage 3 unspecified: Secondary | ICD-10-CM | POA: Diagnosis not present

## 2024-03-24 DIAGNOSIS — F329 Major depressive disorder, single episode, unspecified: Secondary | ICD-10-CM | POA: Diagnosis not present

## 2024-03-24 DIAGNOSIS — E039 Hypothyroidism, unspecified: Secondary | ICD-10-CM | POA: Diagnosis not present

## 2024-03-24 DIAGNOSIS — J9621 Acute and chronic respiratory failure with hypoxia: Secondary | ICD-10-CM | POA: Diagnosis not present

## 2024-03-24 DIAGNOSIS — F0284 Dementia in other diseases classified elsewhere, unspecified severity, with anxiety: Secondary | ICD-10-CM | POA: Diagnosis not present

## 2024-03-24 DIAGNOSIS — I5033 Acute on chronic diastolic (congestive) heart failure: Secondary | ICD-10-CM | POA: Diagnosis not present

## 2024-03-24 DIAGNOSIS — F0283 Dementia in other diseases classified elsewhere, unspecified severity, with mood disturbance: Secondary | ICD-10-CM | POA: Diagnosis not present

## 2024-03-24 DIAGNOSIS — F418 Other specified anxiety disorders: Secondary | ICD-10-CM | POA: Diagnosis not present

## 2024-03-25 DIAGNOSIS — E039 Hypothyroidism, unspecified: Secondary | ICD-10-CM | POA: Diagnosis not present

## 2024-03-25 DIAGNOSIS — R2689 Other abnormalities of gait and mobility: Secondary | ICD-10-CM | POA: Diagnosis not present

## 2024-03-25 DIAGNOSIS — I130 Hypertensive heart and chronic kidney disease with heart failure and stage 1 through stage 4 chronic kidney disease, or unspecified chronic kidney disease: Secondary | ICD-10-CM | POA: Diagnosis not present

## 2024-03-25 DIAGNOSIS — G309 Alzheimer's disease, unspecified: Secondary | ICD-10-CM | POA: Diagnosis not present

## 2024-03-25 DIAGNOSIS — N183 Chronic kidney disease, stage 3 unspecified: Secondary | ICD-10-CM | POA: Diagnosis not present

## 2024-03-25 DIAGNOSIS — I5033 Acute on chronic diastolic (congestive) heart failure: Secondary | ICD-10-CM | POA: Diagnosis not present

## 2024-03-25 DIAGNOSIS — S72141D Displaced intertrochanteric fracture of right femur, subsequent encounter for closed fracture with routine healing: Secondary | ICD-10-CM | POA: Diagnosis not present

## 2024-03-25 DIAGNOSIS — J9621 Acute and chronic respiratory failure with hypoxia: Secondary | ICD-10-CM | POA: Diagnosis not present

## 2024-03-25 DIAGNOSIS — J684 Chronic respiratory conditions due to chemicals, gases, fumes and vapors: Secondary | ICD-10-CM | POA: Diagnosis not present

## 2024-03-25 DIAGNOSIS — I5032 Chronic diastolic (congestive) heart failure: Secondary | ICD-10-CM | POA: Diagnosis not present

## 2024-03-25 DIAGNOSIS — F0283 Dementia in other diseases classified elsewhere, unspecified severity, with mood disturbance: Secondary | ICD-10-CM | POA: Diagnosis not present

## 2024-03-25 DIAGNOSIS — F418 Other specified anxiety disorders: Secondary | ICD-10-CM | POA: Diagnosis not present

## 2024-03-25 DIAGNOSIS — F0284 Dementia in other diseases classified elsewhere, unspecified severity, with anxiety: Secondary | ICD-10-CM | POA: Diagnosis not present

## 2024-03-25 DIAGNOSIS — J9611 Chronic respiratory failure with hypoxia: Secondary | ICD-10-CM | POA: Diagnosis not present

## 2024-03-25 DIAGNOSIS — F329 Major depressive disorder, single episode, unspecified: Secondary | ICD-10-CM | POA: Diagnosis not present

## 2024-03-25 DIAGNOSIS — M810 Age-related osteoporosis without current pathological fracture: Secondary | ICD-10-CM | POA: Diagnosis not present

## 2024-03-25 DIAGNOSIS — E559 Vitamin D deficiency, unspecified: Secondary | ICD-10-CM | POA: Diagnosis not present

## 2024-03-25 DIAGNOSIS — M6281 Muscle weakness (generalized): Secondary | ICD-10-CM | POA: Diagnosis not present

## 2024-03-26 DIAGNOSIS — J9621 Acute and chronic respiratory failure with hypoxia: Secondary | ICD-10-CM | POA: Diagnosis not present

## 2024-03-26 DIAGNOSIS — E039 Hypothyroidism, unspecified: Secondary | ICD-10-CM | POA: Diagnosis not present

## 2024-03-26 DIAGNOSIS — F418 Other specified anxiety disorders: Secondary | ICD-10-CM | POA: Diagnosis not present

## 2024-03-26 DIAGNOSIS — I5033 Acute on chronic diastolic (congestive) heart failure: Secondary | ICD-10-CM | POA: Diagnosis not present

## 2024-03-26 DIAGNOSIS — F0283 Dementia in other diseases classified elsewhere, unspecified severity, with mood disturbance: Secondary | ICD-10-CM | POA: Diagnosis not present

## 2024-03-26 DIAGNOSIS — F329 Major depressive disorder, single episode, unspecified: Secondary | ICD-10-CM | POA: Diagnosis not present

## 2024-03-26 DIAGNOSIS — N183 Chronic kidney disease, stage 3 unspecified: Secondary | ICD-10-CM | POA: Diagnosis not present

## 2024-03-26 DIAGNOSIS — I130 Hypertensive heart and chronic kidney disease with heart failure and stage 1 through stage 4 chronic kidney disease, or unspecified chronic kidney disease: Secondary | ICD-10-CM | POA: Diagnosis not present

## 2024-03-26 DIAGNOSIS — F0284 Dementia in other diseases classified elsewhere, unspecified severity, with anxiety: Secondary | ICD-10-CM | POA: Diagnosis not present

## 2024-03-27 ENCOUNTER — Ambulatory Visit: Admitting: Podiatry

## 2024-03-27 DIAGNOSIS — L97521 Non-pressure chronic ulcer of other part of left foot limited to breakdown of skin: Secondary | ICD-10-CM | POA: Diagnosis not present

## 2024-03-27 DIAGNOSIS — M2012 Hallux valgus (acquired), left foot: Secondary | ICD-10-CM

## 2024-03-27 DIAGNOSIS — J9621 Acute and chronic respiratory failure with hypoxia: Secondary | ICD-10-CM | POA: Diagnosis not present

## 2024-03-27 DIAGNOSIS — F0284 Dementia in other diseases classified elsewhere, unspecified severity, with anxiety: Secondary | ICD-10-CM | POA: Diagnosis not present

## 2024-03-27 DIAGNOSIS — M21612 Bunion of left foot: Secondary | ICD-10-CM | POA: Diagnosis not present

## 2024-03-27 DIAGNOSIS — F0283 Dementia in other diseases classified elsewhere, unspecified severity, with mood disturbance: Secondary | ICD-10-CM | POA: Diagnosis not present

## 2024-03-27 DIAGNOSIS — F329 Major depressive disorder, single episode, unspecified: Secondary | ICD-10-CM | POA: Diagnosis not present

## 2024-03-27 DIAGNOSIS — M2042 Other hammer toe(s) (acquired), left foot: Secondary | ICD-10-CM | POA: Diagnosis not present

## 2024-03-27 DIAGNOSIS — I130 Hypertensive heart and chronic kidney disease with heart failure and stage 1 through stage 4 chronic kidney disease, or unspecified chronic kidney disease: Secondary | ICD-10-CM | POA: Diagnosis not present

## 2024-03-27 DIAGNOSIS — F418 Other specified anxiety disorders: Secondary | ICD-10-CM | POA: Diagnosis not present

## 2024-03-27 DIAGNOSIS — N183 Chronic kidney disease, stage 3 unspecified: Secondary | ICD-10-CM | POA: Diagnosis not present

## 2024-03-27 DIAGNOSIS — I5033 Acute on chronic diastolic (congestive) heart failure: Secondary | ICD-10-CM | POA: Diagnosis not present

## 2024-03-27 DIAGNOSIS — E039 Hypothyroidism, unspecified: Secondary | ICD-10-CM | POA: Diagnosis not present

## 2024-03-27 NOTE — Progress Notes (Signed)
 Chief Complaint  Patient presents with   Wound Check    NP She is her today for an open wound between her Left great, 2nd and 3 toes. Great toe crosses under her 2nd toe on that foot. It does cause her pain when moving her toes or walking. Not diabetic and no anti coag. Her daughter and friend are with her today   HPI: 89 y.o. female presenting today with family members with concern of a wound on the lateral aspect of the great toe on the left.  She has a bunion and hammertoe present.  Patient is mostly nonambulatory and is in a wheelchair today.  Patient was recently placed on antibiotics for the infection to the toe and the family members are stating that the toe looks almost 100% better since starting the antibiotics.  Past Medical History:  Diagnosis Date   Chronic back pain    Complication of anesthesia    hard to wake up   Depression    eczema    Glaucoma    Hearing impairment    Hypertension    Memory impairment    Osteoporosis     Past Surgical History:  Procedure Laterality Date   ABDOMINAL HYSTERECTOMY     BACK SURGERY     x 3   DILATION AND CURETTAGE OF UTERUS     EYE SURGERY     bilateral cataracts with lens implants   HAND SURGERY     left hand surgery   TOTAL KNEE ARTHROPLASTY  11/18/2012   Procedure: TOTAL KNEE ARTHROPLASTY;  Surgeon: Loanne Drilling, MD;  Location: [facility];  Service: Orthopedics;  Laterality: Right;    Allergies  Allergen Reactions   Arthritis Pain Regimen [Aspirin] Nausea And Vomiting    Pt states all arthritis medications    PHYSICAL EXAM: General: The patient is alert and oriented x3 in no acute distress.  Dermatology: Decreased skin turgor.  No erythema noted    Wound 1:  Location: Lateral left hallux        Depth: Partial-thickness        Wound Border: Macerated        Wound Base: Granular        Drainage: Scant serous       Odor?:  None        Surrounding Tissue: No erythema or calor is noted        Infected?:  No         Necrosis?:  No        Pain?:  Yes        Tunneling: No       Dimensions (cm): 0.3 x 0.3 x 0.1 cm  Vascular: Pedal pulses are diminished  Neurological: Light touch sensation intact  Musculoskeletal Exam: Track bound lateral angulation of the left hallux.  Bony prominence on medial first metatarsal head.  There is a medially deviated left second toe which is overlapping the hallux  ASSESSMENT / PLAN OF CARE: 1. Hallux valgus with bunions, left   2. Hammertoe of second toe of left foot   3. Chronic ulcer of toe of left foot, limited to breakdown of skin (HCC)     Time was taken to counsel the family and the patient on the wound and daily care needed.  The ulceration was sharply debrided of hyperkeratotic and devitalized soft tissue with sterile #312 blade to the level of dermis.  Hemostasis obtained.  Antibiotic ointment and DSD applied.  Reviewed off-loading with patient.  Surgical shoe dispensed for patient to wear at all times WB.   Reviewed daily dressing changes with patient.  They will apply iodine solution to the area followed by a light gauze dressing.  Did not recommend any thick spacers for between the toes since the toes are fairly rigid and this would only cause more rubbing and irritation.  Discussed risks / concerns regarding ulcer with patient and possible sequelae if left untreated.  Stressed importance of infection prevention at home. Short-term goals are: prevent infection, off-load ulcer, heal ulcer Long-term goals are:  prevent recurrence, prevent amputation.   Return in about 2 weeks (around 04/10/2024) for f/u ulcer.   Clerance Lav, DPM, FACFAS [facility]     [address] [place], [place] [address]                Office [phone]  Fax [phone]

## 2024-03-28 DIAGNOSIS — R0602 Shortness of breath: Secondary | ICD-10-CM | POA: Diagnosis not present

## 2024-03-28 DIAGNOSIS — Z79899 Other long term (current) drug therapy: Secondary | ICD-10-CM | POA: Diagnosis not present

## 2024-03-28 DIAGNOSIS — M62838 Other muscle spasm: Secondary | ICD-10-CM | POA: Diagnosis not present

## 2024-03-28 DIAGNOSIS — I10 Essential (primary) hypertension: Secondary | ICD-10-CM | POA: Diagnosis not present

## 2024-03-28 DIAGNOSIS — Z9181 History of falling: Secondary | ICD-10-CM | POA: Diagnosis not present

## 2024-03-28 DIAGNOSIS — M47816 Spondylosis without myelopathy or radiculopathy, lumbar region: Secondary | ICD-10-CM | POA: Diagnosis not present

## 2024-03-28 DIAGNOSIS — M1712 Unilateral primary osteoarthritis, left knee: Secondary | ICD-10-CM | POA: Diagnosis not present

## 2024-04-01 DIAGNOSIS — J9621 Acute and chronic respiratory failure with hypoxia: Secondary | ICD-10-CM | POA: Diagnosis not present

## 2024-04-01 DIAGNOSIS — F0283 Dementia in other diseases classified elsewhere, unspecified severity, with mood disturbance: Secondary | ICD-10-CM | POA: Diagnosis not present

## 2024-04-01 DIAGNOSIS — F0284 Dementia in other diseases classified elsewhere, unspecified severity, with anxiety: Secondary | ICD-10-CM | POA: Diagnosis not present

## 2024-04-01 DIAGNOSIS — I130 Hypertensive heart and chronic kidney disease with heart failure and stage 1 through stage 4 chronic kidney disease, or unspecified chronic kidney disease: Secondary | ICD-10-CM | POA: Diagnosis not present

## 2024-04-01 DIAGNOSIS — E039 Hypothyroidism, unspecified: Secondary | ICD-10-CM | POA: Diagnosis not present

## 2024-04-01 DIAGNOSIS — N183 Chronic kidney disease, stage 3 unspecified: Secondary | ICD-10-CM | POA: Diagnosis not present

## 2024-04-01 DIAGNOSIS — F418 Other specified anxiety disorders: Secondary | ICD-10-CM | POA: Diagnosis not present

## 2024-04-01 DIAGNOSIS — I5033 Acute on chronic diastolic (congestive) heart failure: Secondary | ICD-10-CM | POA: Diagnosis not present

## 2024-04-01 DIAGNOSIS — F329 Major depressive disorder, single episode, unspecified: Secondary | ICD-10-CM | POA: Diagnosis not present

## 2024-04-02 DIAGNOSIS — E039 Hypothyroidism, unspecified: Secondary | ICD-10-CM | POA: Diagnosis not present

## 2024-04-02 DIAGNOSIS — F329 Major depressive disorder, single episode, unspecified: Secondary | ICD-10-CM | POA: Diagnosis not present

## 2024-04-02 DIAGNOSIS — F0284 Dementia in other diseases classified elsewhere, unspecified severity, with anxiety: Secondary | ICD-10-CM | POA: Diagnosis not present

## 2024-04-02 DIAGNOSIS — I5033 Acute on chronic diastolic (congestive) heart failure: Secondary | ICD-10-CM | POA: Diagnosis not present

## 2024-04-02 DIAGNOSIS — N183 Chronic kidney disease, stage 3 unspecified: Secondary | ICD-10-CM | POA: Diagnosis not present

## 2024-04-02 DIAGNOSIS — J9621 Acute and chronic respiratory failure with hypoxia: Secondary | ICD-10-CM | POA: Diagnosis not present

## 2024-04-02 DIAGNOSIS — F0283 Dementia in other diseases classified elsewhere, unspecified severity, with mood disturbance: Secondary | ICD-10-CM | POA: Diagnosis not present

## 2024-04-02 DIAGNOSIS — F418 Other specified anxiety disorders: Secondary | ICD-10-CM | POA: Diagnosis not present

## 2024-04-02 DIAGNOSIS — I130 Hypertensive heart and chronic kidney disease with heart failure and stage 1 through stage 4 chronic kidney disease, or unspecified chronic kidney disease: Secondary | ICD-10-CM | POA: Diagnosis not present

## 2024-04-03 DIAGNOSIS — I130 Hypertensive heart and chronic kidney disease with heart failure and stage 1 through stage 4 chronic kidney disease, or unspecified chronic kidney disease: Secondary | ICD-10-CM | POA: Diagnosis not present

## 2024-04-03 DIAGNOSIS — I5033 Acute on chronic diastolic (congestive) heart failure: Secondary | ICD-10-CM | POA: Diagnosis not present

## 2024-04-03 DIAGNOSIS — F0284 Dementia in other diseases classified elsewhere, unspecified severity, with anxiety: Secondary | ICD-10-CM | POA: Diagnosis not present

## 2024-04-03 DIAGNOSIS — J9621 Acute and chronic respiratory failure with hypoxia: Secondary | ICD-10-CM | POA: Diagnosis not present

## 2024-04-03 DIAGNOSIS — E039 Hypothyroidism, unspecified: Secondary | ICD-10-CM | POA: Diagnosis not present

## 2024-04-03 DIAGNOSIS — F418 Other specified anxiety disorders: Secondary | ICD-10-CM | POA: Diagnosis not present

## 2024-04-03 DIAGNOSIS — F0283 Dementia in other diseases classified elsewhere, unspecified severity, with mood disturbance: Secondary | ICD-10-CM | POA: Diagnosis not present

## 2024-04-03 DIAGNOSIS — F329 Major depressive disorder, single episode, unspecified: Secondary | ICD-10-CM | POA: Diagnosis not present

## 2024-04-03 DIAGNOSIS — N183 Chronic kidney disease, stage 3 unspecified: Secondary | ICD-10-CM | POA: Diagnosis not present

## 2024-04-08 DIAGNOSIS — R208 Other disturbances of skin sensation: Secondary | ICD-10-CM | POA: Diagnosis not present

## 2024-04-08 DIAGNOSIS — Z789 Other specified health status: Secondary | ICD-10-CM | POA: Diagnosis not present

## 2024-04-08 DIAGNOSIS — L2989 Other pruritus: Secondary | ICD-10-CM | POA: Diagnosis not present

## 2024-04-08 DIAGNOSIS — L820 Inflamed seborrheic keratosis: Secondary | ICD-10-CM | POA: Diagnosis not present

## 2024-04-08 DIAGNOSIS — L538 Other specified erythematous conditions: Secondary | ICD-10-CM | POA: Diagnosis not present

## 2024-04-09 DIAGNOSIS — I130 Hypertensive heart and chronic kidney disease with heart failure and stage 1 through stage 4 chronic kidney disease, or unspecified chronic kidney disease: Secondary | ICD-10-CM | POA: Diagnosis not present

## 2024-04-09 DIAGNOSIS — F329 Major depressive disorder, single episode, unspecified: Secondary | ICD-10-CM | POA: Diagnosis not present

## 2024-04-09 DIAGNOSIS — N183 Chronic kidney disease, stage 3 unspecified: Secondary | ICD-10-CM | POA: Diagnosis not present

## 2024-04-09 DIAGNOSIS — F0284 Dementia in other diseases classified elsewhere, unspecified severity, with anxiety: Secondary | ICD-10-CM | POA: Diagnosis not present

## 2024-04-09 DIAGNOSIS — J9621 Acute and chronic respiratory failure with hypoxia: Secondary | ICD-10-CM | POA: Diagnosis not present

## 2024-04-09 DIAGNOSIS — F0283 Dementia in other diseases classified elsewhere, unspecified severity, with mood disturbance: Secondary | ICD-10-CM | POA: Diagnosis not present

## 2024-04-09 DIAGNOSIS — I5033 Acute on chronic diastolic (congestive) heart failure: Secondary | ICD-10-CM | POA: Diagnosis not present

## 2024-04-09 DIAGNOSIS — E039 Hypothyroidism, unspecified: Secondary | ICD-10-CM | POA: Diagnosis not present

## 2024-04-09 DIAGNOSIS — F418 Other specified anxiety disorders: Secondary | ICD-10-CM | POA: Diagnosis not present

## 2024-04-10 ENCOUNTER — Ambulatory Visit (INDEPENDENT_AMBULATORY_CARE_PROVIDER_SITE_OTHER): Admitting: Podiatry

## 2024-04-10 DIAGNOSIS — F418 Other specified anxiety disorders: Secondary | ICD-10-CM | POA: Diagnosis not present

## 2024-04-10 DIAGNOSIS — J9621 Acute and chronic respiratory failure with hypoxia: Secondary | ICD-10-CM | POA: Diagnosis not present

## 2024-04-10 DIAGNOSIS — L97521 Non-pressure chronic ulcer of other part of left foot limited to breakdown of skin: Secondary | ICD-10-CM

## 2024-04-10 DIAGNOSIS — M79674 Pain in right toe(s): Secondary | ICD-10-CM | POA: Diagnosis not present

## 2024-04-10 DIAGNOSIS — E039 Hypothyroidism, unspecified: Secondary | ICD-10-CM | POA: Diagnosis not present

## 2024-04-10 DIAGNOSIS — B351 Tinea unguium: Secondary | ICD-10-CM

## 2024-04-10 DIAGNOSIS — F329 Major depressive disorder, single episode, unspecified: Secondary | ICD-10-CM | POA: Diagnosis not present

## 2024-04-10 DIAGNOSIS — M79675 Pain in left toe(s): Secondary | ICD-10-CM

## 2024-04-10 DIAGNOSIS — F0283 Dementia in other diseases classified elsewhere, unspecified severity, with mood disturbance: Secondary | ICD-10-CM | POA: Diagnosis not present

## 2024-04-10 DIAGNOSIS — N183 Chronic kidney disease, stage 3 unspecified: Secondary | ICD-10-CM | POA: Diagnosis not present

## 2024-04-10 DIAGNOSIS — I130 Hypertensive heart and chronic kidney disease with heart failure and stage 1 through stage 4 chronic kidney disease, or unspecified chronic kidney disease: Secondary | ICD-10-CM | POA: Diagnosis not present

## 2024-04-10 DIAGNOSIS — F0284 Dementia in other diseases classified elsewhere, unspecified severity, with anxiety: Secondary | ICD-10-CM | POA: Diagnosis not present

## 2024-04-10 DIAGNOSIS — I5033 Acute on chronic diastolic (congestive) heart failure: Secondary | ICD-10-CM | POA: Diagnosis not present

## 2024-04-10 NOTE — Progress Notes (Signed)
 Chief Complaint  Patient presents with   ulcer check    Ulcer check, left first, 2nd toes. I can not get a good visual on it. Reports no pain .   HPI: 89 y.o. female presenting today with a family member for recheck of the ulceration on the lateral aspect of the left hallux IPJ area.  They have been applying iodine solution to the area followed by application of lambswool in the interspace.  They feel that there is some improvement noted.  The family member is requesting that the patient's toenails also be trimmed today.  The patient was not able to transfer to the exam chair today  Past Medical History:  Diagnosis Date   Chronic back pain    Complication of anesthesia    hard to wake up   Depression    eczema    Glaucoma    Hearing impairment    Hypertension    Memory impairment    Osteoporosis     Past Surgical History:  Procedure Laterality Date   ABDOMINAL HYSTERECTOMY     BACK SURGERY     x 3   DILATION AND CURETTAGE OF UTERUS     EYE SURGERY     bilateral cataracts with lens implants   HAND SURGERY     left hand surgery   TOTAL KNEE ARTHROPLASTY  11/18/2012   Procedure: TOTAL KNEE ARTHROPLASTY;  Surgeon: Aurther Blue, MD;  Location: [facility];  Service: Orthopedics;  Laterality: Right;    Allergies  Allergen Reactions   Arthritis Pain Regimen [Aspirin ] Nausea And Vomiting    Pt states all arthritis medications    PHYSICAL EXAM: Dermatology: Skin is warm, dry and supple bilateral lower extremities. Interspaces are clear of maceration and debris.  The nails x 10 are 3 mm thick with yellow discoloration, subungual debris, distal onycholysis, and pain with compression    Wound 1:  Location: Lateral left hallux IPJ        Depth: To dermis        Wound Border: Dry        Wound Base: Granular        Drainage: No active drainage noted       Odor?:  None        Surrounding Tissue: No surrounding erythema or edema        Infected?:  No        Necrosis?:  No         Pain?:  Yes        Tunneling: No       Dimensions (cm): 0.3 x 0.2 x 0.1 cm   Vascular: Pedal pulses are trace palpable  Musculoskeletal Exam: There is hallux valgus on the left with overlapping second hammertoe.  The hallux is not fully reducible with manipulation.  ASSESSMENT / PLAN OF CARE: 1. Chronic ulcer of toe of left foot, limited to breakdown of skin (HCC)   2. Pain due to onychomycosis of toenails of both feet     The ulceration was sharply debrided of hyperkeratotic and devitalized soft tissue with sterile curette to the level of dermis.  Minimal debridement was necessary today.  Hemostasis obtained.  Medihoney ointment and DSD applied.  They can leave today's dressing on for 2 days and then may return to iodine solution followed by lambswool between the toes.  Continue with surgical shoe to keep pressure off the toes.  Reviewed daily dressing changes with patient.  The  nails x 10 were debrided with sterile nail nippers and a power debriding bur.  Discussed risks / concerns regarding ulcer with patient and possible sequelae if left untreated.  Stressed importance of infection prevention at home. Short-term goals are: prevent infection, off-load ulcer, heal ulcer Long-term goals are:  prevent recurrence, prevent amputation.   Return in about 3 weeks (around 05/01/2024) for f/u ulcer.   Joe Murders, DPM, FACFAS [facility]     [address] [place], [place] [address]                Office [phone]  Fax [phone]

## 2024-04-15 DIAGNOSIS — J9621 Acute and chronic respiratory failure with hypoxia: Secondary | ICD-10-CM | POA: Diagnosis not present

## 2024-04-15 DIAGNOSIS — N183 Chronic kidney disease, stage 3 unspecified: Secondary | ICD-10-CM | POA: Diagnosis not present

## 2024-04-15 DIAGNOSIS — F0284 Dementia in other diseases classified elsewhere, unspecified severity, with anxiety: Secondary | ICD-10-CM | POA: Diagnosis not present

## 2024-04-15 DIAGNOSIS — F329 Major depressive disorder, single episode, unspecified: Secondary | ICD-10-CM | POA: Diagnosis not present

## 2024-04-15 DIAGNOSIS — F418 Other specified anxiety disorders: Secondary | ICD-10-CM | POA: Diagnosis not present

## 2024-04-15 DIAGNOSIS — F0283 Dementia in other diseases classified elsewhere, unspecified severity, with mood disturbance: Secondary | ICD-10-CM | POA: Diagnosis not present

## 2024-04-15 DIAGNOSIS — I5033 Acute on chronic diastolic (congestive) heart failure: Secondary | ICD-10-CM | POA: Diagnosis not present

## 2024-04-15 DIAGNOSIS — E039 Hypothyroidism, unspecified: Secondary | ICD-10-CM | POA: Diagnosis not present

## 2024-04-15 DIAGNOSIS — I130 Hypertensive heart and chronic kidney disease with heart failure and stage 1 through stage 4 chronic kidney disease, or unspecified chronic kidney disease: Secondary | ICD-10-CM | POA: Diagnosis not present

## 2024-04-18 DIAGNOSIS — J9621 Acute and chronic respiratory failure with hypoxia: Secondary | ICD-10-CM | POA: Diagnosis not present

## 2024-04-18 DIAGNOSIS — I130 Hypertensive heart and chronic kidney disease with heart failure and stage 1 through stage 4 chronic kidney disease, or unspecified chronic kidney disease: Secondary | ICD-10-CM | POA: Diagnosis not present

## 2024-04-18 DIAGNOSIS — N183 Chronic kidney disease, stage 3 unspecified: Secondary | ICD-10-CM | POA: Diagnosis not present

## 2024-04-18 DIAGNOSIS — F0284 Dementia in other diseases classified elsewhere, unspecified severity, with anxiety: Secondary | ICD-10-CM | POA: Diagnosis not present

## 2024-04-18 DIAGNOSIS — F329 Major depressive disorder, single episode, unspecified: Secondary | ICD-10-CM | POA: Diagnosis not present

## 2024-04-18 DIAGNOSIS — F418 Other specified anxiety disorders: Secondary | ICD-10-CM | POA: Diagnosis not present

## 2024-04-18 DIAGNOSIS — I5033 Acute on chronic diastolic (congestive) heart failure: Secondary | ICD-10-CM | POA: Diagnosis not present

## 2024-04-18 DIAGNOSIS — E039 Hypothyroidism, unspecified: Secondary | ICD-10-CM | POA: Diagnosis not present

## 2024-04-18 DIAGNOSIS — F0283 Dementia in other diseases classified elsewhere, unspecified severity, with mood disturbance: Secondary | ICD-10-CM | POA: Diagnosis not present

## 2024-04-29 DIAGNOSIS — Z9181 History of falling: Secondary | ICD-10-CM | POA: Diagnosis not present

## 2024-04-29 DIAGNOSIS — I130 Hypertensive heart and chronic kidney disease with heart failure and stage 1 through stage 4 chronic kidney disease, or unspecified chronic kidney disease: Secondary | ICD-10-CM | POA: Diagnosis not present

## 2024-04-29 DIAGNOSIS — M1712 Unilateral primary osteoarthritis, left knee: Secondary | ICD-10-CM | POA: Diagnosis not present

## 2024-04-29 DIAGNOSIS — J9611 Chronic respiratory failure with hypoxia: Secondary | ICD-10-CM | POA: Diagnosis not present

## 2024-04-29 DIAGNOSIS — I5032 Chronic diastolic (congestive) heart failure: Secondary | ICD-10-CM | POA: Diagnosis not present

## 2024-04-29 DIAGNOSIS — H409 Unspecified glaucoma: Secondary | ICD-10-CM | POA: Diagnosis not present

## 2024-04-29 DIAGNOSIS — M47816 Spondylosis without myelopathy or radiculopathy, lumbar region: Secondary | ICD-10-CM | POA: Diagnosis not present

## 2024-04-29 DIAGNOSIS — G309 Alzheimer's disease, unspecified: Secondary | ICD-10-CM | POA: Diagnosis not present

## 2024-04-29 DIAGNOSIS — H109 Unspecified conjunctivitis: Secondary | ICD-10-CM | POA: Diagnosis not present

## 2024-04-29 DIAGNOSIS — Z79899 Other long term (current) drug therapy: Secondary | ICD-10-CM | POA: Diagnosis not present

## 2024-04-29 DIAGNOSIS — N1831 Chronic kidney disease, stage 3a: Secondary | ICD-10-CM | POA: Diagnosis not present

## 2024-05-01 ENCOUNTER — Ambulatory Visit (INDEPENDENT_AMBULATORY_CARE_PROVIDER_SITE_OTHER): Admitting: Podiatry

## 2024-05-01 DIAGNOSIS — L03116 Cellulitis of left lower limb: Secondary | ICD-10-CM | POA: Diagnosis not present

## 2024-05-01 DIAGNOSIS — B957 Other staphylococcus as the cause of diseases classified elsewhere: Secondary | ICD-10-CM | POA: Diagnosis not present

## 2024-05-01 DIAGNOSIS — L97522 Non-pressure chronic ulcer of other part of left foot with fat layer exposed: Secondary | ICD-10-CM | POA: Diagnosis not present

## 2024-05-01 DIAGNOSIS — M2012 Hallux valgus (acquired), left foot: Secondary | ICD-10-CM

## 2024-05-01 DIAGNOSIS — I70245 Atherosclerosis of native arteries of left leg with ulceration of other part of foot: Secondary | ICD-10-CM

## 2024-05-01 DIAGNOSIS — L080 Pyoderma: Secondary | ICD-10-CM | POA: Diagnosis not present

## 2024-05-01 MED ORDER — SULFAMETHOXAZOLE-TRIMETHOPRIM 800-160 MG PO TABS: 1.0000 | ORAL_TABLET | Freq: Two times a day (BID) | ORAL | 0 refills | Status: DC

## 2024-05-01 NOTE — Progress Notes (Signed)
 Chief Complaint  Patient presents with   Wound Check    Left foot wound between first and 2nd toes. Her whole foot is red and on fire. She states it is hurting more now then it was at the initial appt. Not diabetic and takes ASA 81    HPI: 89 y.o. female presenting today for follow-up of ulcer on the lateral aspect of the left great toe.  She has family members with her today.  They state that the foot is now red and swollen and she is having significant pain.  Past Medical History:  Diagnosis Date   Chronic back pain    Complication of anesthesia    hard to wake up   Depression    eczema    Glaucoma    Hearing impairment    Hypertension    Memory impairment    Osteoporosis    Past Surgical History:  Procedure Laterality Date   ABDOMINAL HYSTERECTOMY     BACK SURGERY     x 3   DILATION AND CURETTAGE OF UTERUS     EYE SURGERY     bilateral cataracts with lens implants   HAND SURGERY     left hand surgery   TOTAL KNEE ARTHROPLASTY  11/18/2012   Procedure: TOTAL KNEE ARTHROPLASTY;  Surgeon: Aurther Blue, MD;  Location: [facility];  Service: Orthopedics;  Laterality: Right;   Allergies  Allergen Reactions   Arthritis Pain Regimen [Aspirin ] Nausea And Vomiting    Pt states all arthritis medications    PHYSICAL EXAM: General: The patient is alert and oriented x3 in no acute distress.  Dermatology: Skin has decreased turgor to the legs and feet.  The entire dorsal forefoot is edematous and erythematous with calor.  This does not extend beyond the tarsometatarsal joints.  Wound 1:  Location: Lateral left hallux        Depth: Appears full-thickness but somewhat unstageable due to difficulty getting access to the ulcer        Wound Border: Clear        Wound Base: Fibrous        Drainage: None       Odor?:  None        Surrounding Tissue: Erythematous, edematous, with calor        Infected?:  Yes        Necrosis?:  No        Pain?:  Yes        Tunneling: No        Dimensions (cm): Approximately 0.8 cm time 0.4 cm time 0.3 cm  Vascular: Pedal pulses are nonpalpable.  Doppler was utilized to listen to the DP/PT pulses.  These were monophasic utilizing the Doppler  Neurological: Light touch sensation intact  Musculoskeletal Exam: Severe rigid hallux valgus deformity left foot with overlapping second toe.  These are unable to be manually spread apart or splinted apart.  ASSESSMENT / PLAN OF CARE: 1. Cellulitis of left foot   2. Atherosclerosis of native artery of left lower extremity with ulceration of other part of foot (HCC)   3. Skin ulcer of left great toe with fat layer exposed (HCC)   4. Ulcer of toe of left foot, with fat layer exposed (HCC)   5. Hallux valgus (acquired), left foot      Meds ordered this encounter  Medications   sulfamethoxazole -trimethoprim  (BACTRIM  DS) 800-160 MG tablet    Sig: Take 1 tablet by mouth 2 (two) times daily for  7 days.    Dispense:  14 tablet    Refill:  0   AMB REFERRAL TO VASCULAR SURGERY/CLINIC  Culture swabs of the wound(s) were obtained today.  This was sent to [facility] labs  The ulceration could not be debrided due to difficulty getting access to the area and significant pain for the patient.    Her vascular status is concerning.  Will obtain urgent ABIs and consultation with the vascular surgeon.  This wound is most likely not going to heal on its own due to the continued pressure from the toes and poor perfusion into the toe.  She may need to have amputation of the second toe to relieve pressure on the lateral hallux at some point in the near future.  Continue with surgical shoe/open toe shoes  Sent in Bactrim  DS 1 tablet twice daily x 7 days for the infection.  Reviewed daily dressing changes with patient.  Discussed risks / concerns regarding ulcer with patient and possible sequelae if left untreated.  Stressed importance of infection prevention at home. Short-term goals are: Resolve infection,  off-load ulcer, heal ulcer, obtain accurate vascular status to forefoot Long-term goals are:  prevent recurrence, prevent amputation.   Follow-up 1 week   Yecenia Dalgleish DEstle Hemp, DPM, FACFAS [facility]     [address] [place], [place] [address]                Office [phone]  Fax [phone]

## 2024-05-05 ENCOUNTER — Ambulatory Visit (HOSPITAL_COMMUNITY)
Admission: RE | Admit: 2024-05-05 | Discharge: 2024-05-05 | Disposition: A | Discharge status: Home / Self Care | Source: Ambulatory Visit | Attending: Surgery | Admitting: Surgery

## 2024-05-05 ENCOUNTER — Ambulatory Visit: Attending: Surgery | Admitting: Surgery

## 2024-05-05 ENCOUNTER — Encounter: Payer: Self-pay | Admitting: Surgery

## 2024-05-05 ENCOUNTER — Other Ambulatory Visit: Payer: Self-pay

## 2024-05-05 VITALS — BP 159/99 | HR 61 | Temp 97.7°F | Ht 63.0 in

## 2024-05-05 DIAGNOSIS — I70213 Atherosclerosis of native arteries of extremities with intermittent claudication, bilateral legs: Secondary | ICD-10-CM

## 2024-05-05 DIAGNOSIS — Z79899 Other long term (current) drug therapy: Secondary | ICD-10-CM | POA: Diagnosis not present

## 2024-05-05 DIAGNOSIS — I70248 Atherosclerosis of native arteries of left leg with ulceration of other part of lower left leg: Secondary | ICD-10-CM

## 2024-05-05 LAB — VAS US ABI WITH/WO TBI
Left ABI: 0.97
Right ABI: 0.82

## 2024-05-05 NOTE — Progress Notes (Signed)
 [facility]ist [facility]  Patient name: Cynthia Guzman MRN: [id number] DOB: 03/10/1932 Sex: female   REQUESTING PROVIDER:    Dr. Estle Hemp   REASON FOR CONSULT:    ulcer  HISTORY OF PRESENT ILLNESS:   Cynthia Guzman is a 89 y.o. female, who is referred for evaluation of a left foot wound between the 1st and 2nd toes.  The foot has become red and painful as well as edematous.  She has been seen by podiatry who is considering amputation.  She is on oral antibiotics  The patient is very hard of hearing.  She is medically managed for hypertension.  She is a former smoker.  She takes a statin for hypercholesterolemia.  PAST MEDICAL HISTORY    Past Medical History:  Diagnosis Date   Chronic back pain    Complication of anesthesia    hard to wake up   Depression    eczema    Glaucoma    Hearing impairment    Hypertension    Memory impairment    Osteoporosis      FAMILY HISTORY   Family History  Problem Relation Age of Onset   Alzheimer's disease Other        "all of her aunts and her mother" per daughter    SOCIAL HISTORY:   Social History   Socioeconomic History   Marital status: Married    Spouse name: Not on file   Number of children: Not on file   Years of education: Not on file   Highest education level: Not on file  Occupational History   Not on file  Tobacco Use   Smoking status: Former    Current packs/day: 0.00    Average packs/day: 1 pack/day for 60.0 years (60.0 ttl pk-yrs)    Types: Cigarettes    Start date: 12/26/1955    Quit date: 12/26/2015    Years since quitting: 8.3   Smokeless tobacco: Never  Vaping Use   Vaping status: Never Used  Substance and Sexual Activity   Alcohol  use: No   Drug use: No   Sexual activity: Not on file  Other Topics Concern   Not on file  Social History Narrative   Not on file   Social Drivers of Health   Financial Resource Strain: Not on file   Food Insecurity: Not on file  Transportation Needs: Not on file  Physical Activity: Not on file  Stress: Not on file  Social Connections: Unknown (05/09/2022)   Received from [facility]   Social Network    Social Network: Not on file  Intimate Partner Violence: Unknown (03/31/2022)   Received from [facility], [facility]   HITS    Physically Hurt: Not on file    Insult or Talk Down To: Not on file    Threaten Physical Harm: Not on file    Scream or Curse: Not on file    ALLERGIES:    Allergies  Allergen Reactions   Arthritis Pain Regimen [Aspirin ] Nausea And Vomiting    Pt states all arthritis medications    CURRENT MEDICATIONS:    Current Outpatient Medications  Medication Sig Dispense Refill   amLODipine  (NORVASC ) 5 MG tablet Take 1 tablet (5 mg total) by mouth at bedtime. 60 tablet 0   aspirin  EC 81 MG tablet Take 81 mg by mouth daily.     buprenorphine (BUTRANS) 20 MCG/HR PTWK Place 1 patch onto the skin once a week.  Cholecalciferol (VITAMIN D) 2000 units CAPS Take 2,000 Units by mouth daily.     dorzolamide -timolol  (COSOPT ) 22.3-6.8 MG/ML ophthalmic solution Place 1 drop into both eyes 2 (two) times daily.     latanoprost  (XALATAN ) 0.005 % ophthalmic solution Place 1 drop into both eyes at bedtime.     metoprolol  (LOPRESSOR ) 50 MG tablet Take 50 mg by mouth daily.      Probiotic Product (ALIGN PO) Take by mouth.     promethazine  (PHENERGAN ) 25 MG tablet Take 25 mg by mouth 4 (four) times daily.     sertraline  (ZOLOFT ) 100 MG tablet Take 150 mg by mouth at bedtime.      simvastatin  (ZOCOR ) 20 MG tablet Take 20 mg by mouth at bedtime.      sulfamethoxazole -trimethoprim  (BACTRIM  DS) 800-160 MG tablet Take 1 tablet by mouth 2 (two) times daily for 7 days. 14 tablet 0   telmisartan (MICARDIS) 40 MG tablet Take 40 mg by mouth daily.     No current facility-administered medications for this visit.    REVIEW OF SYSTEMS:   [X]  denotes positive  finding, [ ]  denotes negative finding Cardiac  Comments:  Chest pain or chest pressure:    Shortness of breath upon exertion:    Short of breath when lying flat:    Irregular heart rhythm:        Vascular    Pain in calf, thigh, or hip brought on by ambulation:    Pain in feet at night that wakes you up from your sleep:     Blood clot in your veins:    Leg swelling:         Pulmonary    Oxygen  at home:    Productive cough:     Wheezing:         Neurologic    Sudden weakness in arms or legs:     Sudden numbness in arms or legs:     Sudden onset of difficulty speaking or slurred speech:    Temporary loss of vision in one eye:     Problems with dizziness:         Gastrointestinal    Blood in stool:      Vomited blood:         Genitourinary    Burning when urinating:     Blood in urine:        Psychiatric    Major depression:         Hematologic    Bleeding problems:    Problems with blood clotting too easily:        Skin    Rashes or ulcers:        Constitutional    Fever or chills:     PHYSICAL EXAM:   Vitals:   05/05/24 1414  BP: (!) 159/99  Pulse: 61  Temp: 97.7 F (36.5 C)  Height: 5\' 3"  (1.6 m)    GENERAL: The patient is a well-nourished female, in no acute distress. The vital signs are documented above. CARDIAC: There is a regular rate and rhythm.  VASCULAR: Nonpalpable pedal pulses.  Bilateral edema PULMONARY: Nonlabored respirations MUSCULOSKELETAL: There are no major deformities or cyanosis. NEUROLOGIC: No focal weakness or paresthesias are detected. SKIN: Left great toe ulcer PSYCHIATRIC: The patient has a normal affect.     STUDIES:   I have reviewed the following:   +-------+-----------+-----------+  ABI/TBIToday's ABIToday's TBI  +-------+-----------+-----------+  Right 0.82       0.62         +-------+-----------+-----------+  Left  0.97       0.48         +-------+-----------+-----------+  Right toe pressure:  123 Left toe pressure: 95 Waveforms are all monophasic    Assessment and plan:   Atherosclerotic lower extremity vascular disease with left great toe ulcer: The patient has abnormal ABIs with monophasic waveforms.  Podiatry is considering toe amputation.  I think the patient needs to undergo angiography to determine whether or not she can heal a toe amputation.  In addition I suspect she will have disease that would be correctable and so this may help with wound healing.  I am scheduling this for Tuesday, May 20.  I did discuss the risk of bleeding and other complications and they wish to proceed.  Marti Slates, MD, FACS [facility] [phone] Pager [phone]

## 2024-05-05 NOTE — H&P (View-Only) (Signed)
 Vascular and Vein Specialist of [facility]  Patient name: Cynthia Guzman MRN: [id number] DOB: 03/10/1932 Sex: female   REQUESTING PROVIDER:    Dr. Estle Hemp   REASON FOR CONSULT:    ulcer  HISTORY OF PRESENT ILLNESS:   Cynthia Guzman is a 89 y.o. female, who is referred for evaluation of a left foot wound between the 1st and 2nd toes.  The foot has become red and painful as well as edematous.  She has been seen by podiatry who is considering amputation.  She is on oral antibiotics  The patient is very hard of hearing.  She is medically managed for hypertension.  She is a former smoker.  She takes a statin for hypercholesterolemia.  PAST MEDICAL HISTORY    Past Medical History:  Diagnosis Date   Chronic back pain    Complication of anesthesia    hard to wake up   Depression    eczema    Glaucoma    Hearing impairment    Hypertension    Memory impairment    Osteoporosis      FAMILY HISTORY   Family History  Problem Relation Age of Onset   Alzheimer's disease Other        "all of her aunts and her mother" per daughter    SOCIAL HISTORY:   Social History   Socioeconomic History   Marital status: Married    Spouse name: Not on file   Number of children: Not on file   Years of education: Not on file   Highest education level: Not on file  Occupational History   Not on file  Tobacco Use   Smoking status: Former    Current packs/day: 0.00    Average packs/day: 1 pack/day for 60.0 years (60.0 ttl pk-yrs)    Types: Cigarettes    Start date: 12/26/1955    Quit date: 12/26/2015    Years since quitting: 8.3   Smokeless tobacco: Never  Vaping Use   Vaping status: Never Used  Substance and Sexual Activity   Alcohol  use: No   Drug use: No   Sexual activity: Not on file  Other Topics Concern   Not on file  Social History Narrative   Not on file   Social Drivers of Health   Financial Resource Strain: Not on file   Food Insecurity: Not on file  Transportation Needs: Not on file  Physical Activity: Not on file  Stress: Not on file  Social Connections: Unknown (05/09/2022)   Received from [facility], [facility]   Social Network    Social Network: Not on file  Intimate Partner Violence: Unknown (03/31/2022)   Received from [facility], [facility]   HITS    Physically Hurt: Not on file    Insult or Talk Down To: Not on file    Threaten Physical Harm: Not on file    Scream or Curse: Not on file    ALLERGIES:    Allergies  Allergen Reactions   Arthritis Pain Regimen [Aspirin ] Nausea And Vomiting    Pt states all arthritis medications    CURRENT MEDICATIONS:    Current Outpatient Medications  Medication Sig Dispense Refill   amLODipine  (NORVASC ) 5 MG tablet Take 1 tablet (5 mg total) by mouth at bedtime. 60 tablet 0   aspirin  EC 81 MG tablet Take 81 mg by mouth daily.     buprenorphine (BUTRANS) 20 MCG/HR PTWK Place 1 patch onto the skin once a week.  Cholecalciferol (VITAMIN D) 2000 units CAPS Take 2,000 Units by mouth daily.     dorzolamide -timolol  (COSOPT ) 22.3-6.8 MG/ML ophthalmic solution Place 1 drop into both eyes 2 (two) times daily.     latanoprost  (XALATAN ) 0.005 % ophthalmic solution Place 1 drop into both eyes at bedtime.     metoprolol  (LOPRESSOR ) 50 MG tablet Take 50 mg by mouth daily.      Probiotic Product (ALIGN PO) Take by mouth.     promethazine  (PHENERGAN ) 25 MG tablet Take 25 mg by mouth 4 (four) times daily.     sertraline  (ZOLOFT ) 100 MG tablet Take 150 mg by mouth at bedtime.      simvastatin  (ZOCOR ) 20 MG tablet Take 20 mg by mouth at bedtime.      sulfamethoxazole -trimethoprim  (BACTRIM  DS) 800-160 MG tablet Take 1 tablet by mouth 2 (two) times daily for 7 days. 14 tablet 0   telmisartan (MICARDIS) 40 MG tablet Take 40 mg by mouth daily.     No current facility-administered medications for this visit.    REVIEW OF SYSTEMS:   [X]  denotes positive  finding, [ ]  denotes negative finding Cardiac  Comments:  Chest pain or chest pressure:    Shortness of breath upon exertion:    Short of breath when lying flat:    Irregular heart rhythm:        Vascular    Pain in calf, thigh, or hip brought on by ambulation:    Pain in feet at night that wakes you up from your sleep:     Blood clot in your veins:    Leg swelling:         Pulmonary    Oxygen  at home:    Productive cough:     Wheezing:         Neurologic    Sudden weakness in arms or legs:     Sudden numbness in arms or legs:     Sudden onset of difficulty speaking or slurred speech:    Temporary loss of vision in one eye:     Problems with dizziness:         Gastrointestinal    Blood in stool:      Vomited blood:         Genitourinary    Burning when urinating:     Blood in urine:        Psychiatric    Major depression:         Hematologic    Bleeding problems:    Problems with blood clotting too easily:        Skin    Rashes or ulcers:        Constitutional    Fever or chills:     PHYSICAL EXAM:   Vitals:   05/05/24 1414  BP: (!) 159/99  Pulse: 61  Temp: 97.7 F (36.5 C)  Height: 5\' 3"  (1.6 m)    GENERAL: The patient is a well-nourished female, in no acute distress. The vital signs are documented above. CARDIAC: There is a regular rate and rhythm.  VASCULAR: Nonpalpable pedal pulses.  Bilateral edema PULMONARY: Nonlabored respirations MUSCULOSKELETAL: There are no major deformities or cyanosis. NEUROLOGIC: No focal weakness or paresthesias are detected. SKIN: Left great toe ulcer PSYCHIATRIC: The patient has a normal affect.     STUDIES:   I have reviewed the following:   +-------+-----------+-----------+  ABI/TBIToday's ABIToday's TBI  +-------+-----------+-----------+  Right 0.82       0.62         +-------+-----------+-----------+  Left  0.97       0.48         +-------+-----------+-----------+  Right toe pressure:  123 Left toe pressure: 95 Waveforms are all monophasic    Assessment and plan:   Atherosclerotic lower extremity vascular disease with left great toe ulcer: The patient has abnormal ABIs with monophasic waveforms.  Podiatry is considering toe amputation.  I think the patient needs to undergo angiography to determine whether or not she can heal a toe amputation.  In addition I suspect she will have disease that would be correctable and so this may help with wound healing.  I am scheduling this for Tuesday, May 20.  I did discuss the risk of bleeding and other complications and they wish to proceed.  Marti Slates, MD, FACS Vascular and Vein Specialists of [facility] [phone] Pager [phone]

## 2024-05-06 ENCOUNTER — Telehealth: Payer: Self-pay

## 2024-05-06 NOTE — Telephone Encounter (Signed)
 Attempted to call for surgery scheduling. LVM

## 2024-05-07 ENCOUNTER — Other Ambulatory Visit: Payer: Self-pay

## 2024-05-07 ENCOUNTER — Ambulatory Visit (INDEPENDENT_AMBULATORY_CARE_PROVIDER_SITE_OTHER): Admitting: Podiatry

## 2024-05-07 ENCOUNTER — Telehealth: Payer: Self-pay

## 2024-05-07 DIAGNOSIS — I70245 Atherosclerosis of native arteries of left leg with ulceration of other part of foot: Secondary | ICD-10-CM

## 2024-05-07 DIAGNOSIS — L97522 Non-pressure chronic ulcer of other part of left foot with fat layer exposed: Secondary | ICD-10-CM

## 2024-05-07 DIAGNOSIS — I70248 Atherosclerosis of native arteries of left leg with ulceration of other part of lower left leg: Secondary | ICD-10-CM

## 2024-05-07 MED ORDER — SULFAMETHOXAZOLE-TRIMETHOPRIM 800-160 MG PO TABS: 1.0000 | ORAL_TABLET | Freq: Two times a day (BID) | ORAL | 0 refills | Status: AC

## 2024-05-07 NOTE — Progress Notes (Signed)
 Chief Complaint  Patient presents with   Wound Check    Left first toe ulcer check with cellulitis. The first toe is still red and warm to the touch. The spot between 1 and 2 looks about the same and she states it is real sore. She has a Vascular SX apt on May 20 to have "tube" run from groin to toes to help with circulation. Takes ASA 81    HPI: 89 y.o. female presenting today with a family member for follow-up of ulceration on the lateral aspect of the left hallux near the IP joint.  The patient has been taking Bactrim  DS and the family feels that the erythema/cellulitis has improved significantly.  The redness is almost completely resolved in the forefoot but there is still some present on the great toe.  Patient was able to be seen by vascular surgery since last visit, and states that she is scheduled for catheterization of the left lower extremity to evaluate flow and try to restore/improve blood flow to the left foot since surgical amputation of left second toe may be recommended to help heal the ulceration on the lateral hallux.  Past Medical History:  Diagnosis Date   Chronic back pain    Complication of anesthesia    hard to wake up   Depression    eczema    Glaucoma    Hearing impairment    Hypertension    Memory impairment    Osteoporosis    Past Surgical History:  Procedure Laterality Date   ABDOMINAL HYSTERECTOMY     BACK SURGERY     x 3   DILATION AND CURETTAGE OF UTERUS     EYE SURGERY     bilateral cataracts with lens implants   HAND SURGERY     left hand surgery   TOTAL KNEE ARTHROPLASTY  11/18/2012   Procedure: TOTAL KNEE ARTHROPLASTY;  Surgeon: Aurther Blue, MD;  Location: [facility];  Service: Orthopedics;  Laterality: Right;   No Known Allergies   PHYSICAL EXAM: General: The patient is alert and oriented x3 in no acute distress.  Dermatology:    Wound 1:  Location: Lateral left hallux near the IP joint        Depth: Full-thickness        Wound Border:  No hyperkeratotic tissue along the border and no necrosis along the border        Wound Base: 100% fibrotic        Drainage: None       Odor?:  None        Surrounding Tissue: The hallux remains erythematous but the remainder of the left forefoot erythema is almost 100% resolved        Infected?:  Yes        Necrosis?:  None        Pain?:  Yes        Tunneling: None       Dimensions (cm): Approximately 0.9 x 0.5 x 0.3 cm.  Depth is difficult to ascertain due to poor visualization of the area and pain with any probing of the wound  Vascular: Pedal pulses are nonpalpable  ABI results from 05/05/2024: +-------+-----------+-----------+  ABI/TBIToday's ABIToday's TBI  +-------+-----------+-----------+  Right 0.82       0.62         +-------+-----------+-----------+  Left  0.97       0.48         +-------+-----------+-----------+    ASSESSMENT / PLAN OF  CARE: 1. Skin ulcer of left great toe with fat layer exposed (HCC)   2. Atherosclerosis of native artery of left lower extremity with ulceration of other part of foot (HCC)   3. Ulcer of toe of left foot, with fat layer exposed (HCC)      Meds ordered this encounter  Medications   sulfamethoxazole -trimethoprim  (BACTRIM  DS) 800-160 MG tablet    Sig: Take 1 tablet by mouth 2 (two) times daily for 5 days.    Dispense:  10 tablet    Refill:  0   The ulceration was unable to be debrided today due to pain in the area, and difficulty separating the toes to allow for debridement of the ulceration.Aaron Aas   Antibiotic ointment and DSD applied.  Reviewed off-loading with patient.  More lambswool was dispensed to try to keep space between the toes.  Any other toe spacer puts too much pressure on the area and makes her pain worse.  Her Bactrim  will be renewed for an additional few days to help resolve the cellulitis that remains in the left hallux  Continue with open toed shoes or surgical shoe.  Patient was seen by vascular and is  scheduled for lower extremity catheterization with possible revascularization on May 20 to see if vascular flow can be improved to the left foot to facilitate healing.  Also the family is aware that the best option may also be to amputate the dorsally and medially contracted left second toe due to the pressure applied against the hallux and ulceration that is present if it does not heal on its own.  Discussed risks / concerns regarding ulcer with patient and possible sequelae if left untreated.  Stressed importance of infection prevention at home. Short-term goals are: prevent infection, off-load ulcer, heal ulcer Long-term goals are:  prevent recurrence, prevent amputation.   Return for f/u ulcer after May 20th revascularizaiton.   Joe Murders, DPM, FACFAS [facility]     [address] [place], [place] [address]                Office [phone]  Fax [phone]

## 2024-05-07 NOTE — Telephone Encounter (Signed)
 Patient's daughter requested later morning time due to mother's age and disability.  Arrival time of 7am for angiogram on 5/20.

## 2024-05-13 ENCOUNTER — Other Ambulatory Visit: Payer: Self-pay

## 2024-05-13 ENCOUNTER — Encounter (HOSPITAL_COMMUNITY): Payer: Self-pay | Admitting: Surgery

## 2024-05-13 ENCOUNTER — Encounter (HOSPITAL_COMMUNITY)
Admission: RE | Disposition: A | Discharge status: Home / Self Care | Payer: Self-pay | Source: Home / Self Care | Attending: Surgery

## 2024-05-13 ENCOUNTER — Ambulatory Visit (HOSPITAL_COMMUNITY)
Admission: RE | Admit: 2024-05-13 | Discharge: 2024-05-13 | Disposition: A | Discharge status: Home / Self Care | Attending: Surgery | Admitting: Surgery

## 2024-05-13 DIAGNOSIS — L97529 Non-pressure chronic ulcer of other part of left foot with unspecified severity: Secondary | ICD-10-CM | POA: Insufficient documentation

## 2024-05-13 DIAGNOSIS — Z79899 Other long term (current) drug therapy: Secondary | ICD-10-CM | POA: Insufficient documentation

## 2024-05-13 DIAGNOSIS — I70245 Atherosclerosis of native arteries of left leg with ulceration of other part of foot: Secondary | ICD-10-CM | POA: Insufficient documentation

## 2024-05-13 DIAGNOSIS — E7800 Pure hypercholesterolemia, unspecified: Secondary | ICD-10-CM | POA: Insufficient documentation

## 2024-05-13 DIAGNOSIS — I10 Essential (primary) hypertension: Secondary | ICD-10-CM | POA: Diagnosis not present

## 2024-05-13 DIAGNOSIS — Z87891 Personal history of nicotine dependence: Secondary | ICD-10-CM | POA: Diagnosis not present

## 2024-05-13 DIAGNOSIS — I70248 Atherosclerosis of native arteries of left leg with ulceration of other part of lower left leg: Secondary | ICD-10-CM

## 2024-05-13 HISTORY — PX: ABDOMINAL AORTOGRAM: CATH118222

## 2024-05-13 HISTORY — PX: LOWER EXTREMITY ANGIOGRAPHY: CATH118251

## 2024-05-13 LAB — POCT I-STAT, CHEM 8
BUN: 28 mg/dL — ABNORMAL HIGH (ref 8–23)
Calcium, Ion: 1.31 mmol/L (ref 1.15–1.40)
Chloride: 104 mmol/L (ref 98–111)
Creatinine, Ser: 1.6 mg/dL — ABNORMAL HIGH (ref 0.44–1.00)
Glucose, Bld: 107 mg/dL — ABNORMAL HIGH (ref 70–99)
HCT: 37 % (ref 36.0–46.0)
Hemoglobin: 12.6 g/dL (ref 12.0–15.0)
Potassium: 4.5 mmol/L (ref 3.5–5.1)
Sodium: 138 mmol/L (ref 135–145)
TCO2: 24 mmol/L (ref 22–32)

## 2024-05-13 SURGERY — LOWER EXTREMITY ANGIOGRAPHY
Anesthesia: LOCAL

## 2024-05-13 MED ORDER — LABETALOL HCL 5 MG/ML IV SOLN: 10.0000 mg | INTRAVENOUS | Status: DC | PRN

## 2024-05-13 MED ORDER — HEPARIN (PORCINE) IN NACL 2000-0.9 UNIT/L-% IV SOLN
INTRAVENOUS | Status: DC | PRN
  Administered 2024-05-13: 1000 mL

## 2024-05-13 MED ORDER — SODIUM CHLORIDE 0.9 % IV SOLN: 250.0000 mL | INTRAVENOUS | Status: DC | PRN

## 2024-05-13 MED ORDER — SODIUM CHLORIDE 0.9% FLUSH: 3.0000 mL | Freq: Two times a day (BID) | INTRAVENOUS | Status: DC

## 2024-05-13 MED ORDER — MIDAZOLAM HCL 2 MG/2ML IJ SOLN
INTRAMUSCULAR | Status: AC
  Filled 2024-05-13: qty 2

## 2024-05-13 MED ORDER — SODIUM CHLORIDE 0.9 % IV SOLN: INTRAVENOUS | Status: DC

## 2024-05-13 MED ORDER — ONDANSETRON HCL 4 MG/2ML IJ SOLN: 4.0000 mg | Freq: Four times a day (QID) | INTRAMUSCULAR | Status: DC | PRN

## 2024-05-13 MED ORDER — LIDOCAINE HCL (PF) 1 % IJ SOLN
INTRAMUSCULAR | Status: AC
  Filled 2024-05-13: qty 30

## 2024-05-13 MED ORDER — FENTANYL CITRATE (PF) 100 MCG/2ML IJ SOLN
INTRAMUSCULAR | Status: DC | PRN
  Administered 2024-05-13 (×2): 25 ug via INTRAVENOUS

## 2024-05-13 MED ORDER — MIDAZOLAM HCL 2 MG/2ML IJ SOLN
INTRAMUSCULAR | Status: DC | PRN
  Administered 2024-05-13 (×2): .5 mg via INTRAVENOUS

## 2024-05-13 MED ORDER — SODIUM CHLORIDE 0.9 % WEIGHT BASED INFUSION: 1.0000 mL/kg/h | INTRAVENOUS | Status: DC

## 2024-05-13 MED ORDER — ACETAMINOPHEN 325 MG PO TABS: 650.0000 mg | ORAL_TABLET | ORAL | Status: DC | PRN

## 2024-05-13 MED ORDER — HYDRALAZINE HCL 20 MG/ML IJ SOLN: 5.0000 mg | INTRAMUSCULAR | Status: DC | PRN

## 2024-05-13 MED ORDER — IODIXANOL 320 MG/ML IV SOLN
INTRAVENOUS | Status: DC | PRN
  Administered 2024-05-13: 40 mL

## 2024-05-13 MED ORDER — LIDOCAINE HCL (PF) 1 % IJ SOLN
INTRAMUSCULAR | Status: DC | PRN
  Administered 2024-05-13: 15 mL

## 2024-05-13 MED ORDER — FENTANYL CITRATE (PF) 100 MCG/2ML IJ SOLN
INTRAMUSCULAR | Status: AC
  Filled 2024-05-13: qty 2

## 2024-05-13 MED ORDER — SODIUM CHLORIDE 0.9% FLUSH: 3.0000 mL | INTRAVENOUS | Status: DC | PRN

## 2024-05-13 SURGICAL SUPPLY — 11 items
CATH ANGIO 5F BER2 100CM (CATHETERS) IMPLANT
CATH OMNI FLUSH 5F 65CM (CATHETERS) IMPLANT
DEVICE VASC CLSR CELT ART 5 (Vascular Products) IMPLANT
KIT MICROPUNCTURE NIT STIFF (SHEATH) IMPLANT
KIT PV (KITS) ×1 IMPLANT
KIT SYRINGE INJ CVI SPIKEX1 (MISCELLANEOUS) IMPLANT
SET ATX-X65L (MISCELLANEOUS) IMPLANT
SHEATH PINNACLE 5F 10CM (SHEATH) IMPLANT
SHEATH PROBE COVER 6X72 (BAG) IMPLANT
TRAY PV CATH (CUSTOM PROCEDURE TRAY) ×1 IMPLANT
WIRE BENTSON .035X145CM (WIRE) IMPLANT

## 2024-05-13 NOTE — Discharge Instructions (Signed)
 Femoral Site Care This sheet gives you information about how to care for yourself after your procedure. Your health care provider may also give you more specific instructions. If you have problems or questions, contact your health care provider. What can I expect after the procedure?  After the procedure, it is common to have: Bruising that usually fades within 1-2 weeks. Tenderness at the site. Follow these instructions at home: Wound care Follow instructions from your health care provider about how to take care of your insertion site. Make sure you: Wash your hands with soap and water before you change your bandage (dressing). If soap and water are not available, use hand sanitizer. Remove your dressing as told by your health care provider. 24 hours Do not take baths, swim, or use a hot tub until your health care provider approves. You may shower 24-48 hours after the procedure or as told by your health care provider. Gently wash the site with plain soap and water. Pat the area dry with a clean towel. Do not rub the site. This may cause bleeding. Do not apply powder or lotion to the site. Keep the site clean and dry. Check your femoral site every day for signs of infection. Check for: Redness, swelling, or pain. Fluid or blood. Warmth. Pus or a bad smell. Activity For the first 2-3 days after your procedure, or as long as directed: Avoid climbing stairs as much as possible. Do not squat. Do not lift anything that is heavier than 10 lb (4.5 kg), or the limit that you are told, until your health care provider says that it is safe. For 5 days Rest as directed. Avoid sitting for a long time without moving. Get up to take short walks every 1-2 hours. Do not drive for 24 hours if you were given a medicine to help you relax (sedative). General instructions Take over-the-counter and prescription medicines only as told by your health care provider. Keep all follow-up visits as told by your  health care provider. This is important. Contact a health care provider if you have: A fever or chills. You have redness, swelling, or pain around your insertion site. Get help right away if: The catheter insertion area swells very fast. You Lwin out. You suddenly start to sweat or your skin gets clammy. The catheter insertion area is bleeding, and the bleeding does not stop when you hold steady pressure on the area. The area near or just beyond the catheter insertion site becomes pale, cool, tingly, or numb. These symptoms may represent a serious problem that is an emergency. Do not wait to see if the symptoms will go away. Get medical help right away. Call your local emergency services (911 in the [place] Do not drive yourself to the hospital. Summary After the procedure, it is common to have bruising that usually fades within 1-2 weeks. Check your femoral site every day for signs of infection. Do not lift anything that is heavier than 10 lb (4.5 kg), or the limit that you are told, until your health care provider says that it is safe. This information is not intended to replace advice given to you by your health care provider. Make sure you discuss any questions you have with your health care provider. Document Revised: 12/24/2017 Document Reviewed: 12/24/2017 Elsevier Patient Education  2020 [employer].

## 2024-05-13 NOTE — Op Note (Signed)
    Patient name: Cynthia Guzman MRN: [id number] DOB: 07-06-1932 Sex: female  05/13/2024 Pre-operative Diagnosis: Left toe ulcer Post-operative diagnosis:  Same Surgeon:  Gareld June Procedure Performed:  1.  Ultrasound-guided access, right femoral artery  2.  Abdominal aortogram  3.  Left leg angiogram  4.  Selective injection with catheter in left popliteal artery from right femoral artery  5.  Conscious sedation, 25 minutes     Indications: This is an 89 year old female with left great toe wound who comes in today for arterial evaluation  Procedure:  The patient was identified in the holding area and taken to room 8.  The patient was then placed supine on the table and prepped and draped in the usual sterile fashion.  A time out was called.  Conscious sedation was administered with the use of IV fentanyl  and Versed  under continuous physician and nurse monitoring.  Heart rate, blood pressure, and oxygen  saturation were continuously monitored.  Total sedation time was 25 minutes.  Ultrasound was used to evaluate the right common femoral artery.  It was patent .  A digital ultrasound image was acquired.  A micropuncture needle was used to access the right common femoral artery under ultrasound guidance.  An 018 wire was advanced without resistance and a micropuncture sheath was placed.  The 018 wire was removed and a benson wire was placed.  The micropuncture sheath was exchanged for a 5 french sheath.  An omniflush catheter was advanced over the wire to the level of L-1.  An abdominal angiogram was obtained.  Next, using the omniflush catheter and a benson wire, the aortic bifurcation was crossed and the catheter was placed into theleft external iliac artery and left runoff was obtained.  A Berenstein 2 catheter was then advanced over a Bentson wire into the popliteal artery for additional images of the left leg.  The groin was closed with a Celt  Findings:   Aortogram: No significant renal  artery stenosis was visualized.  The infrarenal abdominal aorta is widely patent.  Bilateral common and external iliac arteries were widely patent.  Bilateral femoral arteries are widely patent.  Right Lower Extremity: Not evaluated  Left Lower Extremity: The left common femoral and profundofemoral artery are widely patent.  The superficial femoral and popliteal artery are widely patent without stenosis.  There is three-vessel runoff to the ankle.  The posterior tibial is the dominant vessel across the ankle.  Stenosis within the posterior tibial artery is less than 30%.  There was excellent filling of the posterior tibial artery out onto the foot.  The peroneal artery collateralizes at the ankle.  The anterior tibial occludes at the ankle and does not reconstitute  Intervention:  none  Impression:  #1  No significant aortoiliac occlusive disease  #2  No significant left femoral-popliteal disease  #3  Three-vessel runoff to the ankle.  The posterior tibial artery perfuses the foot.  It has minimal disease.  The anterior tibial artery occludes at the ankle and does not reconstitute  #4  The patient is maximally revascularized.  Okay to proceed with toe amputation versus continuing wound care   V. Gareld June, M.D., [facility] [place] Office: [phone] Pager:  [phone]

## 2024-05-13 NOTE — Interval H&P Note (Signed)
 History and Physical Interval Note:  05/13/2024 8:32 AM  Cynthia Guzman  has presented today for surgery, with the diagnosis of atherosclerosis left lower extremity with ulcer.  The various methods of treatment have been discussed with the patient and family. After consideration of risks, benefits and other options for treatment, the patient has consented to  Procedure(s): Lower Extremity Angiography (N/A) ABDOMINAL AORTOGRAM (N/A) as a surgical intervention.  The patient's history has been reviewed, patient examined, no change in status, stable for surgery.  I have reviewed the patient's chart and labs.  Questions were answered to the patient's satisfaction.     Gareld June

## 2024-05-14 ENCOUNTER — Ambulatory Visit: Payer: Self-pay | Admitting: Podiatry

## 2024-05-15 ENCOUNTER — Encounter: Payer: Self-pay | Admitting: Podiatry

## 2024-05-15 ENCOUNTER — Ambulatory Visit: Admitting: Podiatry

## 2024-05-15 ENCOUNTER — Ambulatory Visit (INDEPENDENT_AMBULATORY_CARE_PROVIDER_SITE_OTHER)

## 2024-05-15 DIAGNOSIS — L97522 Non-pressure chronic ulcer of other part of left foot with fat layer exposed: Secondary | ICD-10-CM | POA: Diagnosis not present

## 2024-05-15 MED ORDER — SULFAMETHOXAZOLE-TRIMETHOPRIM 800-160 MG PO TABS: 1.0000 | ORAL_TABLET | Freq: Two times a day (BID) | ORAL | 0 refills | Status: AC

## 2024-05-15 NOTE — Progress Notes (Unsigned)
 Chief Complaint  Patient presents with   Wound Check    Wound check and to talk about next steps.     HPI: 89 y.o. female presents today for follow-up of left hallux ulceration.  She was cleared by vascular for digital amputation if needed.  She feels that her toe has not been as painful since having the revascularization procedure performed.  She has been taking the Bactrim  but is almost out of medication.  She feels that this has been helpful as well.  Family member is with her today.  Past Medical History:  Diagnosis Date   Chronic back pain    Complication of anesthesia    hard to wake up   Depression    eczema    Glaucoma    Hearing impairment    Hypertension    Memory impairment    Osteoporosis    Past Surgical History:  Procedure Laterality Date   ABDOMINAL AORTOGRAM N/A 05/13/2024   Procedure: ABDOMINAL AORTOGRAM;  Surgeon: Margherita Shell, MD;  Location: [facility];  Service: Cardiovascular;  Laterality: N/A;   ABDOMINAL HYSTERECTOMY     BACK SURGERY     x 3   DILATION AND CURETTAGE OF UTERUS     EYE SURGERY     bilateral cataracts with lens implants   HAND SURGERY     left hand surgery   LOWER EXTREMITY ANGIOGRAPHY N/A 05/13/2024   Procedure: Lower Extremity Angiography;  Surgeon: Margherita Shell, MD;  Location: [facility];  Service: Cardiovascular;  Laterality: N/A;   TOTAL KNEE ARTHROPLASTY  11/18/2012   Procedure: TOTAL KNEE ARTHROPLASTY;  Surgeon: Aurther Blue, MD;  Location: [facility];  Service: Orthopedics;  Laterality: Right;   No Known Allergies    Physical Exam: Trace palpable pedal pulses noted.  Ulceration on lateral aspect of left hallux has less fibrous tissue noted today and less erythema to the hallux.  There still remains some edema and erythema to the hallux only.  The erythema to the forefoot proximal to the toes has resolved.  The left second hammertoe is rigidly contracted at the PIP joint and medially crossed over the  hallux.  There is pain on palpation of the ulceration.  Radiographic Exam (left foot, 2 nonweightbearing views with technician splaying toes for better visualization during x-ray):  Decreased osseous mineralization throughout the foot.  Due to the decreased osseous mineralization, unable to clearly define the cortex on the lateral aspect of the proximal phalanx and distal phalanx of the left hallux.  There is significant increase in the hallux abductus angle with joint space narrowing noted.  Hardware is seen in the first metatarsal head as well as the second metatarsal head from prior surgery.  No areas seen within the soft tissues.  Assessment/Plan of Care: 1. Skin ulcer of left great toe with fat layer exposed (HCC)   2. Ulcer of toe of left foot, with fat layer exposed (HCC)      Meds ordered this encounter  Medications   sulfamethoxazole -trimethoprim  (BACTRIM  DS) 800-160 MG tablet    Sig: Take 1 tablet by mouth 2 (two) times daily for 10 days.    Dispense:  20 tablet    Refill:  0   Informed the patient and her family member that it is difficult to determine with accuracy whether there is any underlying osteomyelitis to the bone secondary to the osteopenic changes seen in the bones throughout the foot.  I do not feel that  the patient would tolerate lying supine/immobile in an MRI tube for up to an hour in order to confirm this.  Therefore, we will set the patient up for some blood work.  Would expect a sed rate of over 70 to indicate any changes consistent with osteomyelitis.  Blood work was ordered for CBC with differential and sed rate.  Will notify the patient/family once reviewed the results to see if any concerns will need to be addressed regarding possible osteomyelitis to the hallux or if we need to possibly go ahead and proceed with the amputation of the second toe.  Since the toe does look slightly better today since her revascularization procedure, we did agree to hold off on  surgical planning today.  Will recheck in 2 to 3 weeks.  If she still has the ulceration to the lateral left hallux with no signs of continued improvement, then we will plan for amputation of the second toe.  The patient and her family were agreeable to this.  The patient wanted to try to avoid surgery if possible.  We did inform them that this could be performed under local anesthesia only due to her dementia and other comorbidities with increased risk when undergoing surgery.  Follow-up 2 to 3 weeks  Jenne Sellinger DEstle Hemp, DPM, FACFAS [facility]     [address] [place], [place] [address]                Office [phone]  Fax [phone]

## 2024-05-16 LAB — SEDIMENTATION RATE: Sed Rate: 31 mm/h (ref 0–40)

## 2024-05-16 LAB — CBC WITH DIFFERENTIAL/PLATELET
Basophils Absolute: 0.1 10*3/uL (ref 0.0–0.2)
Basos: 0 %
EOS (ABSOLUTE): 0.6 10*3/uL — ABNORMAL HIGH (ref 0.0–0.4)
Eos: 5 %
Hematocrit: 35.5 % (ref 34.0–46.6)
Hemoglobin: 11.2 g/dL (ref 11.1–15.9)
Immature Grans (Abs): 0.1 10*3/uL (ref 0.0–0.1)
Immature Granulocytes: 1 %
Lymphocytes Absolute: 2 10*3/uL (ref 0.7–3.1)
Lymphs: 17 %
MCH: 26.8 pg (ref 26.6–33.0)
MCHC: 31.5 g/dL (ref 31.5–35.7)
MCV: 85 fL (ref 79–97)
Monocytes Absolute: 0.9 10*3/uL (ref 0.1–0.9)
Monocytes: 8 %
Neutrophils Absolute: 8.3 10*3/uL — ABNORMAL HIGH (ref 1.4–7.0)
Neutrophils: 69 %
Platelets: 251 10*3/uL (ref 150–450)
RBC: 4.18 x10E6/uL (ref 3.77–5.28)
RDW: 15.8 % — ABNORMAL HIGH (ref 11.7–15.4)
WBC: 11.9 10*3/uL — ABNORMAL HIGH (ref 3.4–10.8)

## 2024-05-16 LAB — BASIC METABOLIC PANEL WITH GFR
BUN/Creatinine Ratio: 16 (ref 12–28)
BUN: 25 mg/dL (ref 8–27)
CO2: 21 mmol/L (ref 20–29)
Calcium: 9.2 mg/dL (ref 8.7–10.3)
Chloride: 99 mmol/L (ref 96–106)
Creatinine, Ser: 1.54 mg/dL — ABNORMAL HIGH (ref 0.57–1.00)
Glucose: 93 mg/dL (ref 70–99)
Potassium: 4.1 mmol/L (ref 3.5–5.2)
Sodium: 134 mmol/L (ref 134–144)
eGFR: 32 mL/min/{1.73_m2} — ABNORMAL LOW (ref >59)

## 2024-05-18 ENCOUNTER — Ambulatory Visit: Payer: Self-pay | Admitting: Podiatry

## 2024-05-21 NOTE — Progress Notes (Signed)
 Spoke with Mrs. Tays Daughter.  I let her know that the blood work was good and that we would continued with the current plan of treatment and reevaluate at her June, 12 apt.   CT

## 2024-05-28 DIAGNOSIS — M1712 Unilateral primary osteoarthritis, left knee: Secondary | ICD-10-CM | POA: Diagnosis not present

## 2024-05-28 DIAGNOSIS — M62838 Other muscle spasm: Secondary | ICD-10-CM | POA: Diagnosis not present

## 2024-05-28 DIAGNOSIS — Z9181 History of falling: Secondary | ICD-10-CM | POA: Diagnosis not present

## 2024-05-28 DIAGNOSIS — M47816 Spondylosis without myelopathy or radiculopathy, lumbar region: Secondary | ICD-10-CM | POA: Diagnosis not present

## 2024-05-28 DIAGNOSIS — I10 Essential (primary) hypertension: Secondary | ICD-10-CM | POA: Diagnosis not present

## 2024-05-28 DIAGNOSIS — E878 Other disorders of electrolyte and fluid balance, not elsewhere classified: Secondary | ICD-10-CM | POA: Diagnosis not present

## 2024-05-28 DIAGNOSIS — Z79899 Other long term (current) drug therapy: Secondary | ICD-10-CM | POA: Diagnosis not present

## 2024-06-05 ENCOUNTER — Ambulatory Visit: Admitting: Podiatry

## 2024-06-25 DIAGNOSIS — M62838 Other muscle spasm: Secondary | ICD-10-CM | POA: Diagnosis not present

## 2024-06-25 DIAGNOSIS — M1712 Unilateral primary osteoarthritis, left knee: Secondary | ICD-10-CM | POA: Diagnosis not present

## 2024-06-25 DIAGNOSIS — Z9181 History of falling: Secondary | ICD-10-CM | POA: Diagnosis not present

## 2024-06-25 DIAGNOSIS — Z79899 Other long term (current) drug therapy: Secondary | ICD-10-CM | POA: Diagnosis not present

## 2024-06-25 DIAGNOSIS — M47816 Spondylosis without myelopathy or radiculopathy, lumbar region: Secondary | ICD-10-CM | POA: Diagnosis not present

## 2024-06-25 DIAGNOSIS — I10 Essential (primary) hypertension: Secondary | ICD-10-CM | POA: Diagnosis not present

## 2024-06-25 DIAGNOSIS — E878 Other disorders of electrolyte and fluid balance, not elsewhere classified: Secondary | ICD-10-CM | POA: Diagnosis not present

## 2024-06-26 ENCOUNTER — Ambulatory Visit: Admitting: Podiatry

## 2024-07-02 ENCOUNTER — Ambulatory Visit (INDEPENDENT_AMBULATORY_CARE_PROVIDER_SITE_OTHER): Admitting: Podiatry

## 2024-07-02 DIAGNOSIS — M2012 Hallux valgus (acquired), left foot: Secondary | ICD-10-CM

## 2024-07-02 DIAGNOSIS — M2042 Other hammer toe(s) (acquired), left foot: Secondary | ICD-10-CM

## 2024-07-02 DIAGNOSIS — M21612 Bunion of left foot: Secondary | ICD-10-CM

## 2024-07-02 DIAGNOSIS — L97521 Non-pressure chronic ulcer of other part of left foot limited to breakdown of skin: Secondary | ICD-10-CM

## 2024-07-03 NOTE — Progress Notes (Signed)
 Chief Complaint  Patient presents with   Wound Check    Ulcer on left foot between 1 and 2, looks a lot better. It does not appear to be open and new skin is present. Not diabetic and ASA, Pain doctor is needing past notes.    HPI: 89 y.o. female presents today for follow-up of ulceration to the lateral aspect of the left hallux.  Patient is not having any significant pain at this time.  At this point the family has not been using any spacers or wedges between the toes because they usually will work their way out.  They are not putting any ointment on the toe either.  They feel that there has been significant improvement.  Past Medical History:  Diagnosis Date   Chronic back pain    Complication of anesthesia    hard to wake up   Depression    eczema    Glaucoma    Hearing impairment    Hypertension    Memory impairment    Osteoporosis    Past Surgical History:  Procedure Laterality Date   ABDOMINAL AORTOGRAM N/A 05/13/2024   Procedure: ABDOMINAL AORTOGRAM;  Surgeon: Serene Gaile ORN, MD;  Location: [facility];  Service: Cardiovascular;  Laterality: N/A;   ABDOMINAL HYSTERECTOMY     BACK SURGERY     x 3   DILATION AND CURETTAGE OF UTERUS     EYE SURGERY     bilateral cataracts with lens implants   HAND SURGERY     left hand surgery   LOWER EXTREMITY ANGIOGRAPHY N/A 05/13/2024   Procedure: Lower Extremity Angiography;  Surgeon: Serene Gaile ORN, MD;  Location: [facility];  Service: Cardiovascular;  Laterality: N/A;   TOTAL KNEE ARTHROPLASTY  11/18/2012   Procedure: TOTAL KNEE ARTHROPLASTY;  Surgeon: Dempsey LULLA Moan, MD;  Location: [facility];  Service: Orthopedics;  Laterality: Right;   No Known Allergies    Physical Exam: Trace palpable pedal pulses left foot.  No erythema or calor noted to the 1st or 2nd toes.  The edema has resolved in the toes.  The second toe still is contracted and placing pressure directly on the dorsal lateral aspect of the hallux.  With  close inspection, the ulceration appears closed/resolved at this time.  No drainage is noted.  No hyperkeratosis or maceration is noted.  There is no pain on palpation.  Assessment/Plan of Care: 1. Chronic ulcer of toe of left foot, limited to breakdown of skin (HCC)   2. Hallux valgus with bunions, left   3. Hammertoe of second toe of left foot     No treatment needed to be performed today.  Inform them that the wound has resolved and she can return to regular shoe gear but strongly recommend wide and deep toe boxes for that foot to prevent any increased pressure/recurrence.  They need to inspect this area between the toes daily for any signs of recurrence or soft tissue breakdown.  Informed them that this could recur due to the continued pressure from the 2 toes rubbing.  Call immediately if any signs of wound or infection develop.  Follow-up as scheduled for toenail care.  Awanda CHARM Imperial, DPM, FACFAS [facility]     [address]  [place], [place] [address]                Office [phone]  Fax [phone]

## 2024-07-24 DIAGNOSIS — M47816 Spondylosis without myelopathy or radiculopathy, lumbar region: Secondary | ICD-10-CM | POA: Diagnosis not present

## 2024-07-24 DIAGNOSIS — S72141D Displaced intertrochanteric fracture of right femur, subsequent encounter for closed fracture with routine healing: Secondary | ICD-10-CM | POA: Diagnosis not present

## 2024-07-24 DIAGNOSIS — G894 Chronic pain syndrome: Secondary | ICD-10-CM | POA: Diagnosis not present

## 2024-07-24 DIAGNOSIS — M6281 Muscle weakness (generalized): Secondary | ICD-10-CM | POA: Diagnosis not present

## 2024-07-24 DIAGNOSIS — Z7409 Other reduced mobility: Secondary | ICD-10-CM | POA: Diagnosis not present

## 2024-07-24 DIAGNOSIS — J9611 Chronic respiratory failure with hypoxia: Secondary | ICD-10-CM | POA: Diagnosis not present

## 2024-07-24 DIAGNOSIS — M545 Low back pain, unspecified: Secondary | ICD-10-CM | POA: Diagnosis not present

## 2024-07-29 DIAGNOSIS — H40053 Ocular hypertension, bilateral: Secondary | ICD-10-CM | POA: Diagnosis not present

## 2024-07-29 DIAGNOSIS — H353131 Nonexudative age-related macular degeneration, bilateral, early dry stage: Secondary | ICD-10-CM | POA: Diagnosis not present

## 2024-07-29 DIAGNOSIS — Z0100 Encounter for examination of eyes and vision without abnormal findings: Secondary | ICD-10-CM | POA: Diagnosis not present

## 2024-07-29 DIAGNOSIS — Z961 Presence of intraocular lens: Secondary | ICD-10-CM | POA: Diagnosis not present

## 2024-07-30 DIAGNOSIS — E878 Other disorders of electrolyte and fluid balance, not elsewhere classified: Secondary | ICD-10-CM | POA: Diagnosis not present

## 2024-07-30 DIAGNOSIS — I10 Essential (primary) hypertension: Secondary | ICD-10-CM | POA: Diagnosis not present

## 2024-07-30 DIAGNOSIS — Z9181 History of falling: Secondary | ICD-10-CM | POA: Diagnosis not present

## 2024-07-30 DIAGNOSIS — M62838 Other muscle spasm: Secondary | ICD-10-CM | POA: Diagnosis not present

## 2024-07-30 DIAGNOSIS — M47816 Spondylosis without myelopathy or radiculopathy, lumbar region: Secondary | ICD-10-CM | POA: Diagnosis not present

## 2024-07-30 DIAGNOSIS — Z79899 Other long term (current) drug therapy: Secondary | ICD-10-CM | POA: Diagnosis not present

## 2024-07-30 DIAGNOSIS — M1712 Unilateral primary osteoarthritis, left knee: Secondary | ICD-10-CM | POA: Diagnosis not present

## 2024-07-31 ENCOUNTER — Ambulatory Visit (INDEPENDENT_AMBULATORY_CARE_PROVIDER_SITE_OTHER): Admitting: Podiatry

## 2024-07-31 DIAGNOSIS — M79675 Pain in left toe(s): Secondary | ICD-10-CM

## 2024-07-31 DIAGNOSIS — M79674 Pain in right toe(s): Secondary | ICD-10-CM

## 2024-07-31 DIAGNOSIS — B351 Tinea unguium: Secondary | ICD-10-CM | POA: Diagnosis not present

## 2024-07-31 NOTE — Progress Notes (Signed)
    Subjective:  Patient ID: Cynthia Guzman, female    DOB: 1932-01-20,  MRN: [id number]  Cynthia Guzman presents to clinic today for:  Chief Complaint  Patient presents with   RFC    RFC with out callous. Doing well today, the interdigital sore is better. Nail care only today. Not diabetic, asa   Patient notes nails are thick and elongated, causing pain in shoe gear.  [name]  States that patient has not been complaining about the wound between the 1st and 2nd toes on the left foot.  She is not sure if it is resolved still.  PCP is Cynthia Guzman, Cynthia ORN, Cynthia Guzman. last seen around 07/23/2024  Past Medical History:  Diagnosis Date   Chronic back pain    Complication of anesthesia    hard to wake up   Depression    eczema    Glaucoma    Hearing impairment    Hypertension    Memory impairment    Osteoporosis    No Known Allergies  Objective:  Cynthia Guzman is a pleasant 89 y.o. female in NAD. AAO x 3.  Vascular Examination: Patient has palpable DP pulse, absent PT pulse bilateral.  Delayed capillary refill bilateral toes.  Sparse digital hair bilateral.  Proximal to distal cooling WNL bilateral.    Dermatological Examination: Interspaces are clear with no open lesions noted bilateral.  Skin is shiny and atrophic bilateral.  Nails are 3-74mm thick, with yellowish/brown discoloration, subungual debris and distal onycholysis x10.  There is pain with compression of nails x10.  There are no ulcerations or hyperkeratotic lesions noted.  No signs of recurrence of the ulceration in the left first interspace is noted.  Patient qualifies for at-risk foot care because of PVD.  Assessment/Plan: 1. Pain due to onychomycosis of toenails of both feet    Mycotic nails x10 were sharply debrided with sterile nail nippers and power debriding burr to decrease bulk and length.  Family member was advised to please keep close observation at the area between the 1st and  2nd toes on the left foot where she had the previous ulceration.  Please call immediately if there are any signs of recurrence or any signs of infection.  Return in about 9 weeks (around 10/02/2024) for RFC.   Cynthia Guzman, DPM, FACFAS [facility]     [address] [place], [place] [address]                Office [phone]  Fax [phone]

## 2024-08-13 NOTE — Progress Notes (Signed)
-------------------------------------------------------------------------------   Summary: Scan for prototype CD upper and lower -------------------------------------------------------------------------------  RPD/CD Impression [facility] 08/13/24 No primary care provider on file. Treatment Providers Sompop Bencharit, DDS  Dental procedures in this visit  . [id number] - 3D INTRAORAL SURFACE SCAN - DIRECT    HEALTH HISTORY ? Vitals:  BP Readings from Last 1 Encounters:  08/13/24 132/67    Pulse:    Medical history was reviewed and updated. No contraindication to care. Medical History[1] Surgical History[2] Social History   Tobacco Use  . Smoking status: Not on file  . Smokeless tobacco: Not on file  Substance Use Topics  . Alcohol  use: Not on file   Family History[3] Medications Ordered Prior to Encounter[4] Medical Risk Assessment ASA GRADE: ASA 2 - Patient with mild systemic disease with no functional limitations  Subjective: Patient reports no change to condition since last visit.  Reviewed med hx with daughter  Pt presents to clinic for final impression of max CD and mand CD/Bite  Objective:  All tissues appear healthy and wnl.   Assessment: Discussed with patient risks and precautions.   Plan: Impression taken/captured with digital scan Materials used (digital) Additional notes: Informed pt to call if she has questions or concerns  NV: try-in Prototype   Treatment Providers Maia Screen, DDS/Dr [facility] [address] [place] [phone]        [1] History reviewed. No pertinent past medical history. [2] History reviewed. No pertinent surgical history. [3] No family history on file. [4] Current Outpatient Medications on File Prior to Visit  Medication Sig Dispense Refill  . acetaminophen  (TYLENOL ) 325 mg tablet Take 650 mg by mouth.    . aspirin  325 mg EC tablet Take  325 mg by mouth.    . baclofen 5 mg tablet     . buprenorphine (BUTRANS) 20 mcg/hour     . cholecalciferol (VITAMIN D-3) 25 mcg (1,000 unit) tablet Take 1,000 Units by mouth.    . dorzolamide -timoloL  (COSOPT ) 22.3-6.8 mg/mL ophthalmic solution Administer 1 drop into affected eye(s).    SABRA erythromycin (ROMYCIN) 5 mg/gram (0.5 %) ophthalmic ointment     . furosemide  (LASIX ) 40 mg tablet Take 40 mg by mouth.    . HYDROcodone -acetaminophen  (NORCO) 7.5-325 mg tablet Take 1 tablet by mouth.     No current facility-administered medications on file prior to visit.

## 2024-08-15 DIAGNOSIS — Z79899 Other long term (current) drug therapy: Secondary | ICD-10-CM | POA: Diagnosis not present

## 2024-08-15 DIAGNOSIS — M47816 Spondylosis without myelopathy or radiculopathy, lumbar region: Secondary | ICD-10-CM | POA: Diagnosis not present

## 2024-08-15 DIAGNOSIS — Z9181 History of falling: Secondary | ICD-10-CM | POA: Diagnosis not present

## 2024-08-15 DIAGNOSIS — E878 Other disorders of electrolyte and fluid balance, not elsewhere classified: Secondary | ICD-10-CM | POA: Diagnosis not present

## 2024-08-15 DIAGNOSIS — M62838 Other muscle spasm: Secondary | ICD-10-CM | POA: Diagnosis not present

## 2024-08-15 DIAGNOSIS — M1712 Unilateral primary osteoarthritis, left knee: Secondary | ICD-10-CM | POA: Diagnosis not present

## 2024-08-15 DIAGNOSIS — I10 Essential (primary) hypertension: Secondary | ICD-10-CM | POA: Diagnosis not present

## 2024-08-15 DIAGNOSIS — M549 Dorsalgia, unspecified: Secondary | ICD-10-CM | POA: Diagnosis not present

## 2024-08-17 DIAGNOSIS — M545 Low back pain, unspecified: Secondary | ICD-10-CM | POA: Diagnosis not present

## 2024-08-17 DIAGNOSIS — R109 Unspecified abdominal pain: Secondary | ICD-10-CM | POA: Diagnosis not present

## 2024-08-29 DIAGNOSIS — Z79899 Other long term (current) drug therapy: Secondary | ICD-10-CM | POA: Diagnosis not present

## 2024-08-29 DIAGNOSIS — M62838 Other muscle spasm: Secondary | ICD-10-CM | POA: Diagnosis not present

## 2024-08-29 DIAGNOSIS — F0280 Dementia in other diseases classified elsewhere without behavioral disturbance: Secondary | ICD-10-CM | POA: Diagnosis not present

## 2024-08-29 DIAGNOSIS — M47816 Spondylosis without myelopathy or radiculopathy, lumbar region: Secondary | ICD-10-CM | POA: Diagnosis not present

## 2024-08-29 DIAGNOSIS — Z9181 History of falling: Secondary | ICD-10-CM | POA: Diagnosis not present

## 2024-08-29 DIAGNOSIS — G309 Alzheimer's disease, unspecified: Secondary | ICD-10-CM | POA: Diagnosis not present

## 2024-09-02 NOTE — Progress Notes (Signed)
 [facility] 09/02/24 No primary care provider on file. Treatment Providers Suzie Mauricia Severin, DDS MS Merlynn Richters, DDS  Treatment Plan for today:  -Try in and delivery of CD/CD  HEALTH HISTORY ? Vitals:  BP Readings from Last 1 Encounters:  09/02/24 140/72    Pulse:  61 Medical history was reviewed and updated. No contraindication to care. Medical History[1] Surgical History[2] Social History   Tobacco Use  . Smoking status: Not on file  . Smokeless tobacco: Not on file  Substance Use Topics  . Alcohol  use: Not on file   Family History[3] Medications Ordered Prior to Encounter[4] Medical Risk Assessment ASA GRADE: ASA 3 - Patient with moderate systemic disease with functional limitations  Subjective: Patient reports no change to condition since last visit. Pt presents to clinic for try-in and delivery of max CD and mand CD.  Objective:  All tissues appear healthy and wnl.   Assessment:  Discussed with patient risks and precautions.   Plan: Adjustment and confirmation of facial contour, occlusal plane position, and occlusal vertical dimension (adequate freeway space, speech pattern, lip line). Necessary adjustments made to teeth position for esthetics and function. PIP paste applied and high spots adjusted accordingly.  Try in outcome: Pt approves of shade, shape and mold of selected teeth. Pt reviewed and signed try-in consent Pt approved of max and mand cd/cd.  Lab delivered a final CD/CD; since pt was happy with new CD/CD provider allowed for pt to take home new final CD/CD.   Additional notes:  NV: 1 week recall/ adjustments if needed  Treatment Providers Lizabeth Diver, DAII Merlynn Richters, DDS [facility] [address] [place] [address] [phone]       [1] History reviewed. No pertinent past medical history. [2] History reviewed. No pertinent surgical  history. [3] No family history on file. [4] Current Outpatient Medications on File Prior to Visit  Medication Sig Dispense Refill  . erythromycin (ROMYCIN) 5 mg/gram (0.5 %) ophthalmic ointment every 8 (eight) hours.    . naloxone (NARCAN) 4 mg/0.1 mL nasal spray     . ondansetron  (ZOFRAN ) 4 mg tablet 1 tablet Orally every 6 hours as needed for nausea; Duration: 7 days    . predniSONE 5 mg tablets,dose pack     . sulfamethoxazole -trimethoprim  (BACTRIM  DS,SEPTRA  DS) 800-160 mg tablet     . acetaminophen  (TYLENOL ) 325 mg tablet Take 650 mg by mouth.    . acetaminophen  (TYLENOL ) 500 mg tablet 1 tablet BID Orally    . albuterol HFA (PROVENTIL HFA;VENTOLIN HFA) 90 mcg/actuation inhaler every 4 (four) hours.    . aspirin  325 mg EC tablet Take 325 mg by mouth.    . baclofen 5 mg tablet     . Bifidobacterium infantis (Align, B.infantis,) 10.5 mg (10 million cell) tablet,chewable 1 capsule 1 (one) time each day at the same time.    . buprenorphine (BUTRANS) 20 mcg/hour     . cholecalciferol (VITAMIN D-3) 25 mcg (1,000 unit) tablet Take 1,000 Units by mouth.    . dorzolamide -timoloL  (COSOPT ) 22.3-6.8 mg/mL ophthalmic solution Administer 1 drop into affected eye(s).    SABRA erythromycin (ROMYCIN) 5 mg/gram (0.5 %) ophthalmic ointment     . furosemide  (LASIX ) 40 mg tablet Take 40 mg by mouth.    . furosemide  (LASIX ) 40 mg tablet 1 (one) time each day at the same time.    . HYDROcodone -acetaminophen  (NORCO) 7.5-325 mg tablet Take  1 tablet by mouth.    . HYDROcodone -acetaminophen  (NORCO) 7.5-325 mg tablet every 6 (six) hours.    SABRA ibuprofen (ADVIL,MOTRIN) 200 mg tablet 1 tablet with food or milk Orally twice daily    . ipratropium-albuteroL (DUO-NEB) 0.5-2.5 mg/3 mL nebulizer solution 3 mL as needed Inhalation every 4 hrs as needed for wheezing, SOB; Duration: 90 days    . levothyroxine (SYNTHROID, LEVOTHROID) 50 mcg tablet 1 (one) time each day at the same time.    . lidocaine  (LIDODERM ) 5 % patch Place  1-2 patches on the skin.    . loperamide  (IMODIUM  A-D) 2 mg tablet Take 4 mg by mouth.    . metoprolol  succinate (TOPROL -XL) 50 mg 24 hr tablet 1 (one) time each day at the same time.    . psyllium (METAMUCIL) 0.52 gram capsule 4 caps twice daily Orally    . rosuvastatin (CRESTOR) 5 mg tablet 1 (one) time each day at the same time.    . sertraline  (ZOLOFT ) 100 mg tablet 1 (one) time each day at the same time.    . zoledronic  acid (Reclast ) 5 mg/100 mL piggyback as directed Intravenous (start 2022)     No current facility-administered medications on file prior to visit.

## 2024-09-05 DIAGNOSIS — Z79899 Other long term (current) drug therapy: Secondary | ICD-10-CM | POA: Diagnosis not present

## 2024-09-18 DIAGNOSIS — E7800 Pure hypercholesterolemia, unspecified: Secondary | ICD-10-CM | POA: Diagnosis not present

## 2024-09-18 DIAGNOSIS — N1831 Chronic kidney disease, stage 3a: Secondary | ICD-10-CM | POA: Diagnosis not present

## 2024-09-18 DIAGNOSIS — E7849 Other hyperlipidemia: Secondary | ICD-10-CM | POA: Diagnosis not present

## 2024-09-18 DIAGNOSIS — M810 Age-related osteoporosis without current pathological fracture: Secondary | ICD-10-CM | POA: Diagnosis not present

## 2024-09-18 DIAGNOSIS — D631 Anemia in chronic kidney disease: Secondary | ICD-10-CM | POA: Diagnosis not present

## 2024-09-18 DIAGNOSIS — E039 Hypothyroidism, unspecified: Secondary | ICD-10-CM | POA: Diagnosis not present

## 2024-09-18 DIAGNOSIS — I129 Hypertensive chronic kidney disease with stage 1 through stage 4 chronic kidney disease, or unspecified chronic kidney disease: Secondary | ICD-10-CM | POA: Diagnosis not present

## 2024-09-25 DIAGNOSIS — I5032 Chronic diastolic (congestive) heart failure: Secondary | ICD-10-CM | POA: Diagnosis not present

## 2024-09-25 DIAGNOSIS — M810 Age-related osteoporosis without current pathological fracture: Secondary | ICD-10-CM | POA: Diagnosis not present

## 2024-09-25 DIAGNOSIS — N1831 Chronic kidney disease, stage 3a: Secondary | ICD-10-CM | POA: Diagnosis not present

## 2024-09-25 DIAGNOSIS — R82998 Other abnormal findings in urine: Secondary | ICD-10-CM | POA: Diagnosis not present

## 2024-09-25 DIAGNOSIS — J9611 Chronic respiratory failure with hypoxia: Secondary | ICD-10-CM | POA: Diagnosis not present

## 2024-09-25 DIAGNOSIS — M47816 Spondylosis without myelopathy or radiculopathy, lumbar region: Secondary | ICD-10-CM | POA: Diagnosis not present

## 2024-09-25 DIAGNOSIS — Z23 Encounter for immunization: Secondary | ICD-10-CM | POA: Diagnosis not present

## 2024-09-25 DIAGNOSIS — Z1339 Encounter for screening examination for other mental health and behavioral disorders: Secondary | ICD-10-CM | POA: Diagnosis not present

## 2024-09-25 DIAGNOSIS — M545 Low back pain, unspecified: Secondary | ICD-10-CM | POA: Diagnosis not present

## 2024-09-25 DIAGNOSIS — Z7409 Other reduced mobility: Secondary | ICD-10-CM | POA: Diagnosis not present

## 2024-09-25 DIAGNOSIS — Z1331 Encounter for screening for depression: Secondary | ICD-10-CM | POA: Diagnosis not present

## 2024-09-25 DIAGNOSIS — I739 Peripheral vascular disease, unspecified: Secondary | ICD-10-CM | POA: Diagnosis not present

## 2024-09-25 DIAGNOSIS — Z0000 Encounter for general adult medical examination without abnormal findings: Secondary | ICD-10-CM | POA: Diagnosis not present

## 2024-09-25 DIAGNOSIS — I130 Hypertensive heart and chronic kidney disease with heart failure and stage 1 through stage 4 chronic kidney disease, or unspecified chronic kidney disease: Secondary | ICD-10-CM | POA: Diagnosis not present

## 2024-09-29 DIAGNOSIS — Z79899 Other long term (current) drug therapy: Secondary | ICD-10-CM | POA: Diagnosis not present

## 2024-09-29 DIAGNOSIS — I10 Essential (primary) hypertension: Secondary | ICD-10-CM | POA: Diagnosis not present

## 2024-09-29 DIAGNOSIS — M47816 Spondylosis without myelopathy or radiculopathy, lumbar region: Secondary | ICD-10-CM | POA: Diagnosis not present

## 2024-09-29 DIAGNOSIS — E878 Other disorders of electrolyte and fluid balance, not elsewhere classified: Secondary | ICD-10-CM | POA: Diagnosis not present

## 2024-09-29 DIAGNOSIS — M1712 Unilateral primary osteoarthritis, left knee: Secondary | ICD-10-CM | POA: Diagnosis not present

## 2024-09-29 DIAGNOSIS — Z9181 History of falling: Secondary | ICD-10-CM | POA: Diagnosis not present

## 2024-09-29 DIAGNOSIS — M62838 Other muscle spasm: Secondary | ICD-10-CM | POA: Diagnosis not present

## 2024-09-29 DIAGNOSIS — M549 Dorsalgia, unspecified: Secondary | ICD-10-CM | POA: Diagnosis not present

## 2024-10-23 DIAGNOSIS — J9611 Chronic respiratory failure with hypoxia: Secondary | ICD-10-CM | POA: Diagnosis not present

## 2024-10-23 DIAGNOSIS — M47816 Spondylosis without myelopathy or radiculopathy, lumbar region: Secondary | ICD-10-CM | POA: Diagnosis not present

## 2024-10-23 DIAGNOSIS — M545 Low back pain, unspecified: Secondary | ICD-10-CM | POA: Diagnosis not present

## 2024-10-23 DIAGNOSIS — Z7409 Other reduced mobility: Secondary | ICD-10-CM | POA: Diagnosis not present

## 2024-10-23 DIAGNOSIS — S72141D Displaced intertrochanteric fracture of right femur, subsequent encounter for closed fracture with routine healing: Secondary | ICD-10-CM | POA: Diagnosis not present

## 2024-10-23 DIAGNOSIS — M6281 Muscle weakness (generalized): Secondary | ICD-10-CM | POA: Diagnosis not present

## 2024-10-23 DIAGNOSIS — G894 Chronic pain syndrome: Secondary | ICD-10-CM | POA: Diagnosis not present

## 2024-10-30 ENCOUNTER — Ambulatory Visit (INDEPENDENT_AMBULATORY_CARE_PROVIDER_SITE_OTHER): Admitting: Podiatry

## 2024-10-30 DIAGNOSIS — E559 Vitamin D deficiency, unspecified: Secondary | ICD-10-CM | POA: Diagnosis not present

## 2024-10-30 DIAGNOSIS — M79674 Pain in right toe(s): Secondary | ICD-10-CM

## 2024-10-30 DIAGNOSIS — B351 Tinea unguium: Secondary | ICD-10-CM | POA: Diagnosis not present

## 2024-10-30 DIAGNOSIS — Z79899 Other long term (current) drug therapy: Secondary | ICD-10-CM | POA: Diagnosis not present

## 2024-10-30 DIAGNOSIS — M79675 Pain in left toe(s): Secondary | ICD-10-CM

## 2024-10-30 DIAGNOSIS — I10 Essential (primary) hypertension: Secondary | ICD-10-CM | POA: Diagnosis not present

## 2024-10-30 DIAGNOSIS — M810 Age-related osteoporosis without current pathological fracture: Secondary | ICD-10-CM | POA: Diagnosis not present

## 2024-10-30 DIAGNOSIS — E663 Overweight: Secondary | ICD-10-CM | POA: Diagnosis not present

## 2024-10-30 DIAGNOSIS — M1712 Unilateral primary osteoarthritis, left knee: Secondary | ICD-10-CM | POA: Diagnosis not present

## 2024-10-30 DIAGNOSIS — Z9181 History of falling: Secondary | ICD-10-CM | POA: Diagnosis not present

## 2024-10-30 DIAGNOSIS — M47816 Spondylosis without myelopathy or radiculopathy, lumbar region: Secondary | ICD-10-CM | POA: Diagnosis not present

## 2024-10-30 NOTE — Progress Notes (Signed)
    Subjective:  Patient ID: Cynthia Guzman, female    DOB: Nov 09, 1932,  MRN: [id number]  Cynthia Guzman presents to clinic today for:  Chief Complaint  Patient presents with   RFC    RFC, not diabetic, ASA   Patient notes nails are thick and elongated, causing pain in shoe gear.  [name]    PCP is Tisovec, Charlie ORN, MD. last seen around 10/29/2024  Past Medical History:  Diagnosis Date   Chronic back pain    Complication of anesthesia    hard to wake up   Depression    eczema    Glaucoma    Hearing impairment    Hypertension    Memory impairment    Osteoporosis    No Known Allergies  Objective:  Cynthia Guzman is a pleasant 89 y.o. female in NAD. AAO x 3.  Vascular Examination: Patient has palpable DP pulse, absent PT pulse bilateral.  Delayed capillary refill bilateral toes.  Sparse digital hair bilateral.  Proximal to distal cooling WNL bilateral.    Dermatological Examination: Interspaces are clear with no open lesions noted bilateral.  Skin is shiny and atrophic bilateral.  Nails are 3-25mm thick, with yellowish/brown discoloration, subungual debris and distal onycholysis x10.  There is pain with compression of nails x10.  There are no ulcerations or hyperkeratotic lesions noted.  No signs of recurrence of the ulceration in the left first interspace is noted.  Patient qualifies for at-risk foot care because of PVD.  Assessment/Plan: 1. Pain due to onychomycosis of toenails of both feet    Mycotic nails x10 were sharply debrided with sterile nail nippers and power debriding burr to decrease bulk and length.  Return in about 3 months (around 01/30/2025) for RFC.   Awanda CHARM Imperial, DPM, FACFAS [facility]     [address] [place], [place] [address]                Office [phone]  Fax [phone]

## 2024-11-03 ENCOUNTER — Other Ambulatory Visit (HOSPITAL_COMMUNITY): Payer: Self-pay | Admitting: Internal Medicine

## 2024-11-03 DIAGNOSIS — E559 Vitamin D deficiency, unspecified: Secondary | ICD-10-CM | POA: Insufficient documentation

## 2024-11-03 DIAGNOSIS — M810 Age-related osteoporosis without current pathological fracture: Secondary | ICD-10-CM | POA: Insufficient documentation

## 2024-11-04 ENCOUNTER — Telehealth (HOSPITAL_COMMUNITY): Payer: Self-pay | Admitting: Pharmacy Technician

## 2024-11-04 NOTE — Telephone Encounter (Signed)
 Auth Submission: NO AUTH NEEDED Site of care: [facility] Payer: [employer] Medication & CPT/J Code(s) submitted: Reclast  (Zolendronic acid) G6510 Diagnosis Code: M81.0 Route of submission (phone, fax, portal):  Phone # Fax # Auth type: Buy/Bill HB Units/visits requested: 5mg  x 1 dose, every 12 months Reference number:  Approval from: 11/04/2024 to 12/24/24    Dagoberto Armour, CPhT [facility] Phone: [phone] 11/04/2024

## 2024-11-07 DIAGNOSIS — M4804 Spinal stenosis, thoracic region: Secondary | ICD-10-CM | POA: Diagnosis not present

## 2024-11-07 DIAGNOSIS — M47814 Spondylosis without myelopathy or radiculopathy, thoracic region: Secondary | ICD-10-CM | POA: Diagnosis not present

## 2024-11-07 DIAGNOSIS — R0902 Hypoxemia: Secondary | ICD-10-CM | POA: Diagnosis not present

## 2024-11-07 DIAGNOSIS — M549 Dorsalgia, unspecified: Secondary | ICD-10-CM | POA: Diagnosis not present

## 2024-11-07 DIAGNOSIS — M51369 Other intervertebral disc degeneration, lumbar region without mention of lumbar back pain or lower extremity pain: Secondary | ICD-10-CM | POA: Diagnosis not present

## 2024-11-07 DIAGNOSIS — R109 Unspecified abdominal pain: Secondary | ICD-10-CM | POA: Diagnosis not present

## 2024-11-07 DIAGNOSIS — M48061 Spinal stenosis, lumbar region without neurogenic claudication: Secondary | ICD-10-CM | POA: Diagnosis not present

## 2024-11-07 DIAGNOSIS — M47816 Spondylosis without myelopathy or radiculopathy, lumbar region: Secondary | ICD-10-CM | POA: Diagnosis not present

## 2024-11-07 DIAGNOSIS — R079 Chest pain, unspecified: Secondary | ICD-10-CM | POA: Diagnosis not present

## 2024-11-08 DIAGNOSIS — M4804 Spinal stenosis, thoracic region: Secondary | ICD-10-CM | POA: Diagnosis not present

## 2024-11-08 DIAGNOSIS — M51369 Other intervertebral disc degeneration, lumbar region without mention of lumbar back pain or lower extremity pain: Secondary | ICD-10-CM | POA: Diagnosis not present

## 2024-11-08 DIAGNOSIS — M48061 Spinal stenosis, lumbar region without neurogenic claudication: Secondary | ICD-10-CM | POA: Diagnosis not present

## 2024-11-09 DIAGNOSIS — M4804 Spinal stenosis, thoracic region: Secondary | ICD-10-CM | POA: Diagnosis not present

## 2024-11-09 DIAGNOSIS — M549 Dorsalgia, unspecified: Secondary | ICD-10-CM | POA: Diagnosis not present

## 2024-11-09 DIAGNOSIS — M48061 Spinal stenosis, lumbar region without neurogenic claudication: Secondary | ICD-10-CM | POA: Diagnosis not present

## 2024-11-09 DIAGNOSIS — M51369 Other intervertebral disc degeneration, lumbar region without mention of lumbar back pain or lower extremity pain: Secondary | ICD-10-CM | POA: Diagnosis not present

## 2024-11-10 DIAGNOSIS — M4804 Spinal stenosis, thoracic region: Secondary | ICD-10-CM | POA: Diagnosis not present

## 2024-11-10 DIAGNOSIS — M48061 Spinal stenosis, lumbar region without neurogenic claudication: Secondary | ICD-10-CM | POA: Diagnosis not present

## 2024-11-12 DIAGNOSIS — I5032 Chronic diastolic (congestive) heart failure: Secondary | ICD-10-CM | POA: Diagnosis not present

## 2024-11-18 ENCOUNTER — Ambulatory Visit (HOSPITAL_COMMUNITY)
Admission: RE | Admit: 2024-11-18 | Discharge: 2024-11-18 | Disposition: A | Discharge status: Home / Self Care | Source: Ambulatory Visit | Attending: Internal Medicine | Admitting: Internal Medicine

## 2024-11-18 VITALS — BP 103/68 | HR 57 | Temp 98.3°F | Resp 17

## 2024-11-18 DIAGNOSIS — E559 Vitamin D deficiency, unspecified: Secondary | ICD-10-CM | POA: Insufficient documentation

## 2024-11-18 DIAGNOSIS — M810 Age-related osteoporosis without current pathological fracture: Secondary | ICD-10-CM | POA: Insufficient documentation

## 2024-11-18 MED ORDER — SODIUM CHLORIDE 0.9 % IV SOLN: INTRAVENOUS | Status: DC

## 2024-11-18 MED ORDER — DIPHENHYDRAMINE HCL 25 MG PO CAPS: 25.0000 mg | ORAL_CAPSULE | Freq: Once | ORAL | Status: DC

## 2024-11-18 MED ORDER — ZOLEDRONIC ACID 5 MG/100ML IV SOLN
INTRAVENOUS | Status: AC
  Filled 2024-11-18: qty 100

## 2024-11-18 MED ORDER — ZOLEDRONIC ACID 5 MG/100ML IV SOLN
5.0000 mg | Freq: Once | INTRAVENOUS | Status: AC
  Administered 2024-11-18: 5 mg via INTRAVENOUS

## 2024-11-18 MED ORDER — ACETAMINOPHEN 325 MG PO TABS: 650.0000 mg | ORAL_TABLET | Freq: Once | ORAL | Status: DC

## 2024-11-24 DIAGNOSIS — R0602 Shortness of breath: Secondary | ICD-10-CM | POA: Diagnosis not present

## 2024-11-24 DIAGNOSIS — G8929 Other chronic pain: Secondary | ICD-10-CM | POA: Diagnosis not present

## 2024-11-24 DIAGNOSIS — Z20822 Contact with and (suspected) exposure to covid-19: Secondary | ICD-10-CM | POA: Diagnosis not present

## 2024-11-24 DIAGNOSIS — R000 Tachycardia, unspecified: Secondary | ICD-10-CM | POA: Diagnosis not present

## 2024-11-24 DIAGNOSIS — R0600 Dyspnea, unspecified: Secondary | ICD-10-CM | POA: Diagnosis not present

## 2024-11-24 DIAGNOSIS — N2881 Hypertrophy of kidney: Secondary | ICD-10-CM | POA: Diagnosis not present

## 2024-11-24 DIAGNOSIS — R7989 Other specified abnormal findings of blood chemistry: Secondary | ICD-10-CM | POA: Diagnosis not present

## 2024-11-24 DIAGNOSIS — I110 Hypertensive heart disease with heart failure: Secondary | ICD-10-CM | POA: Diagnosis not present

## 2024-11-24 DIAGNOSIS — J9601 Acute respiratory failure with hypoxia: Secondary | ICD-10-CM | POA: Diagnosis not present

## 2024-11-24 DIAGNOSIS — I214 Non-ST elevation (NSTEMI) myocardial infarction: Secondary | ICD-10-CM | POA: Diagnosis not present

## 2024-11-24 DIAGNOSIS — I509 Heart failure, unspecified: Secondary | ICD-10-CM | POA: Diagnosis not present

## 2024-11-24 DIAGNOSIS — K7689 Other specified diseases of liver: Secondary | ICD-10-CM | POA: Diagnosis not present

## 2024-11-25 DIAGNOSIS — I878 Other specified disorders of veins: Secondary | ICD-10-CM | POA: Diagnosis not present

## 2024-11-25 DIAGNOSIS — I517 Cardiomegaly: Secondary | ICD-10-CM | POA: Diagnosis not present

## 2024-11-26 DIAGNOSIS — J9601 Acute respiratory failure with hypoxia: Secondary | ICD-10-CM | POA: Diagnosis not present

## 2024-11-30 DIAGNOSIS — J9601 Acute respiratory failure with hypoxia: Secondary | ICD-10-CM | POA: Diagnosis not present

## 2024-12-01 DIAGNOSIS — J9601 Acute respiratory failure with hypoxia: Secondary | ICD-10-CM | POA: Diagnosis not present

## 2025-01-29 ENCOUNTER — Ambulatory Visit: Admitting: Podiatry
